# Patient Record
Sex: Female | Born: 1940 | Race: Black or African American | Hispanic: No | Marital: Married | Smoking: Former smoker
Health system: Southern US, Community
[De-identification: ages and names within clinical notes are randomized; demographics above are authoritative.]

## PROBLEM LIST (undated history)

## (undated) DIAGNOSIS — I10 Essential (primary) hypertension: Secondary | ICD-10-CM

## (undated) DIAGNOSIS — J189 Pneumonia, unspecified organism: Secondary | ICD-10-CM

## (undated) DIAGNOSIS — R040 Epistaxis: Secondary | ICD-10-CM

## (undated) DIAGNOSIS — R079 Chest pain, unspecified: Secondary | ICD-10-CM

## (undated) DIAGNOSIS — I780 Hereditary hemorrhagic telangiectasia: Secondary | ICD-10-CM

## (undated) DIAGNOSIS — N189 Chronic kidney disease, unspecified: Secondary | ICD-10-CM

## (undated) DIAGNOSIS — C8190 Hodgkin lymphoma, unspecified, unspecified site: Secondary | ICD-10-CM

## (undated) DIAGNOSIS — K219 Gastro-esophageal reflux disease without esophagitis: Secondary | ICD-10-CM

## (undated) DIAGNOSIS — E7800 Pure hypercholesterolemia, unspecified: Secondary | ICD-10-CM

## (undated) DIAGNOSIS — D649 Anemia, unspecified: Secondary | ICD-10-CM

## (undated) DIAGNOSIS — K3189 Other diseases of stomach and duodenum: Secondary | ICD-10-CM

## (undated) DIAGNOSIS — IMO0001 Reserved for inherently not codable concepts without codable children: Secondary | ICD-10-CM

## (undated) DIAGNOSIS — C3490 Malignant neoplasm of unspecified part of unspecified bronchus or lung: Secondary | ICD-10-CM

## (undated) DIAGNOSIS — J449 Chronic obstructive pulmonary disease, unspecified: Secondary | ICD-10-CM

## (undated) HISTORY — PX: NASAL SINUS SURGERY: SHX719

## (undated) HISTORY — PX: APPENDECTOMY: SHX54

## (undated) HISTORY — PX: OTHER SURGICAL HISTORY: SHX169

## (undated) HISTORY — PX: ABDOMINAL HYSTERECTOMY: SHX81

## (undated) HISTORY — DX: Gastro-esophageal reflux disease without esophagitis: K21.9

## (undated) HISTORY — DX: Essential (primary) hypertension: I10

## (undated) HISTORY — DX: Hodgkin lymphoma, unspecified, unspecified site: C81.90

## (undated) HISTORY — DX: Pure hypercholesterolemia, unspecified: E78.00

---

## 2004-03-01 ENCOUNTER — Ambulatory Visit: Payer: Self-pay | Admitting: Unknown Physician Specialty

## 2004-03-03 ENCOUNTER — Ambulatory Visit: Payer: Self-pay | Admitting: Unknown Physician Specialty

## 2005-03-31 ENCOUNTER — Ambulatory Visit: Payer: Self-pay | Admitting: Unknown Physician Specialty

## 2005-03-31 ENCOUNTER — Emergency Department: Payer: Self-pay | Admitting: Emergency Medicine

## 2005-09-22 ENCOUNTER — Ambulatory Visit: Payer: Self-pay | Admitting: Unknown Physician Specialty

## 2006-05-18 ENCOUNTER — Ambulatory Visit: Payer: Self-pay | Admitting: Unknown Physician Specialty

## 2007-06-13 ENCOUNTER — Ambulatory Visit: Payer: Self-pay | Admitting: Unknown Physician Specialty

## 2008-06-18 ENCOUNTER — Ambulatory Visit: Payer: Self-pay | Admitting: Unknown Physician Specialty

## 2009-06-24 ENCOUNTER — Ambulatory Visit: Payer: Self-pay | Admitting: Unknown Physician Specialty

## 2009-07-11 ENCOUNTER — Emergency Department: Payer: Self-pay | Admitting: Emergency Medicine

## 2009-07-12 ENCOUNTER — Emergency Department: Payer: Self-pay | Admitting: Emergency Medicine

## 2010-06-21 ENCOUNTER — Ambulatory Visit: Payer: Self-pay | Admitting: Unknown Physician Specialty

## 2010-07-02 ENCOUNTER — Ambulatory Visit: Payer: Self-pay | Admitting: Unknown Physician Specialty

## 2010-07-06 ENCOUNTER — Ambulatory Visit: Payer: Self-pay | Admitting: Unknown Physician Specialty

## 2010-08-31 ENCOUNTER — Ambulatory Visit: Payer: Self-pay | Admitting: Unknown Physician Specialty

## 2011-01-12 ENCOUNTER — Ambulatory Visit: Payer: Self-pay | Admitting: Urology

## 2011-06-24 ENCOUNTER — Ambulatory Visit: Payer: Self-pay | Admitting: Unknown Physician Specialty

## 2011-07-01 ENCOUNTER — Ambulatory Visit: Payer: Self-pay | Admitting: Oncology

## 2011-07-01 ENCOUNTER — Ambulatory Visit: Payer: Self-pay | Admitting: Otolaryngology

## 2011-07-01 LAB — LACTATE DEHYDROGENASE: LDH: 209 U/L (ref 84–246)

## 2011-07-11 ENCOUNTER — Ambulatory Visit: Payer: Self-pay | Admitting: Otolaryngology

## 2011-07-11 ENCOUNTER — Ambulatory Visit: Payer: Self-pay | Admitting: Physician Assistant

## 2011-07-11 LAB — PROTIME-INR
INR: 1
Prothrombin Time: 13.2 secs (ref 11.5–14.7)

## 2011-07-13 ENCOUNTER — Ambulatory Visit: Payer: Self-pay | Admitting: Otolaryngology

## 2011-07-20 LAB — CBC CANCER CENTER
Basophil %: 1.7 %
Basophil: 1 %
Eosinophil #: 0.3 x10 3/mm (ref 0.0–0.7)
Eosinophil %: 3.9 %
Eosinophil: 3 %
HGB: 12 g/dL (ref 12.0–16.0)
Lymphocyte %: 21.2 %
Lymphocytes: 25 %
MCH: 30 pg (ref 26.0–34.0)
Monocyte #: 0.6 x10 3/mm (ref 0.0–0.7)
Monocyte %: 8.5 %
Monocytes: 6 %
Neutrophil %: 64.7 %
RBC: 4 10*6/uL (ref 3.80–5.20)
Segmented Neutrophils: 65 %
WBC: 6.5 x10 3/mm (ref 3.6–11.0)

## 2011-07-29 ENCOUNTER — Ambulatory Visit: Payer: Self-pay | Admitting: Oncology

## 2011-08-03 ENCOUNTER — Ambulatory Visit: Payer: Self-pay | Admitting: Oncology

## 2011-08-04 ENCOUNTER — Ambulatory Visit: Payer: Self-pay | Admitting: Vascular Surgery

## 2011-08-08 LAB — CBC CANCER CENTER
Basophil #: 0 x10 3/mm (ref 0.0–0.1)
Eosinophil #: 0.5 x10 3/mm (ref 0.0–0.7)
Eosinophil %: 9.2 %
HCT: 32.9 % — ABNORMAL LOW (ref 35.0–47.0)
Lymphocyte #: 1 x10 3/mm (ref 1.0–3.6)
MCH: 30.3 pg (ref 26.0–34.0)
MCV: 89 fL (ref 80–100)
Monocyte #: 1.1 x10 3/mm — ABNORMAL HIGH (ref 0.0–0.7)
Neutrophil #: 3 x10 3/mm (ref 1.4–6.5)
Platelet: 249 x10 3/mm (ref 150–440)
RBC: 3.71 10*6/uL — ABNORMAL LOW (ref 3.80–5.20)
RDW: 19 % — ABNORMAL HIGH (ref 11.5–14.5)
WBC: 5.6 x10 3/mm (ref 3.6–11.0)

## 2011-08-08 LAB — COMPREHENSIVE METABOLIC PANEL
Albumin: 3.1 g/dL — ABNORMAL LOW (ref 3.4–5.0)
Alkaline Phosphatase: 133 U/L (ref 50–136)
Anion Gap: 8 (ref 7–16)
Calcium, Total: 8.4 mg/dL — ABNORMAL LOW (ref 8.5–10.1)
Co2: 26 mmol/L (ref 21–32)
EGFR (African American): 54 — ABNORMAL LOW
Glucose: 81 mg/dL (ref 65–99)
Potassium: 3.8 mmol/L (ref 3.5–5.1)
SGOT(AST): 35 U/L (ref 15–37)
SGPT (ALT): 38 U/L
Sodium: 139 mmol/L (ref 136–145)
Total Protein: 7.9 g/dL (ref 6.4–8.2)

## 2011-08-15 LAB — COMPREHENSIVE METABOLIC PANEL
Bilirubin,Total: 0.7 mg/dL (ref 0.2–1.0)
Calcium, Total: 8.9 mg/dL (ref 8.5–10.1)
Chloride: 100 mmol/L (ref 98–107)
Co2: 27 mmol/L (ref 21–32)
Creatinine: 1.35 mg/dL — ABNORMAL HIGH (ref 0.60–1.30)
EGFR (African American): 50 — ABNORMAL LOW
EGFR (Non-African Amer.): 41 — ABNORMAL LOW
Glucose: 112 mg/dL — ABNORMAL HIGH (ref 65–99)
Osmolality: 276 (ref 275–301)
Sodium: 136 mmol/L (ref 136–145)

## 2011-08-15 LAB — CBC CANCER CENTER
Basophil #: 0 x10 3/mm (ref 0.0–0.1)
Eosinophil #: 0 x10 3/mm (ref 0.0–0.7)
HCT: 34.4 % — ABNORMAL LOW (ref 35.0–47.0)
HGB: 11.7 g/dL — ABNORMAL LOW (ref 12.0–16.0)
Lymphocyte #: 0.6 x10 3/mm — ABNORMAL LOW (ref 1.0–3.6)
Lymphocyte %: 26.8 %
MCH: 29.9 pg (ref 26.0–34.0)
MCHC: 33.9 g/dL (ref 32.0–36.0)
Monocyte #: 0 x10 3/mm (ref 0.0–0.7)
Monocyte %: 1.6 %
Neutrophil #: 1.5 x10 3/mm (ref 1.4–6.5)
Platelet: 163 x10 3/mm (ref 150–440)
RDW: 16.9 % — ABNORMAL HIGH (ref 11.5–14.5)
WBC: 2.2 x10 3/mm — ABNORMAL LOW (ref 3.6–11.0)

## 2011-08-22 LAB — COMPREHENSIVE METABOLIC PANEL
Albumin: 3.3 g/dL — ABNORMAL LOW (ref 3.4–5.0)
BUN: 27 mg/dL — ABNORMAL HIGH (ref 7–18)
Bilirubin,Total: 0.2 mg/dL (ref 0.2–1.0)
Calcium, Total: 8.4 mg/dL — ABNORMAL LOW (ref 8.5–10.1)
Co2: 25 mmol/L (ref 21–32)
Glucose: 140 mg/dL — ABNORMAL HIGH (ref 65–99)
Potassium: 2.9 mmol/L — ABNORMAL LOW (ref 3.5–5.1)
SGPT (ALT): 35 U/L
Total Protein: 7.7 g/dL (ref 6.4–8.2)

## 2011-08-22 LAB — CBC CANCER CENTER
Bands: 4 %
Basophil %: 0.9 %
Basophil: 1 %
Lymphocyte #: 1.2 x10 3/mm (ref 1.0–3.6)
Lymphocytes: 39 %
MCH: 29.5 pg (ref 26.0–34.0)
Metamyelocyte: 1 %
Monocyte #: 1.5 x10 3/mm — ABNORMAL HIGH (ref 0.0–0.7)
Monocytes: 44 %
Neutrophil #: 0.6 x10 3/mm — ABNORMAL LOW (ref 1.4–6.5)

## 2011-08-22 LAB — BASIC METABOLIC PANEL
Calcium, Total: 7.5 mg/dL — ABNORMAL LOW (ref 8.5–10.1)
Co2: 25 mmol/L (ref 21–32)
Creatinine: 1.42 mg/dL — ABNORMAL HIGH (ref 0.60–1.30)
Glucose: 109 mg/dL — ABNORMAL HIGH (ref 65–99)
Potassium: 3.1 mmol/L — ABNORMAL LOW (ref 3.5–5.1)
Sodium: 136 mmol/L (ref 136–145)

## 2011-08-22 LAB — MAGNESIUM: Magnesium: 1.9 mg/dL

## 2011-08-24 LAB — BASIC METABOLIC PANEL
BUN: 14 mg/dL (ref 7–18)
Creatinine: 1.26 mg/dL (ref 0.60–1.30)
EGFR (Non-African Amer.): 45 — ABNORMAL LOW
Glucose: 98 mg/dL (ref 65–99)
Potassium: 3.8 mmol/L (ref 3.5–5.1)
Sodium: 135 mmol/L — ABNORMAL LOW (ref 136–145)

## 2011-08-24 LAB — CBC CANCER CENTER
Bands: 10 %
Basophil #: 0 x10 3/mm (ref 0.0–0.1)
Basophil %: 0.2 %
Eosinophil #: 0 x10 3/mm (ref 0.0–0.7)
HGB: 10 g/dL — ABNORMAL LOW (ref 12.0–16.0)
Lymphocyte #: 1.2 x10 3/mm (ref 1.0–3.6)
Monocyte #: 1.9 x10 3/mm — ABNORMAL HIGH (ref 0.0–0.7)
Neutrophil #: 3 x10 3/mm (ref 1.4–6.5)
Segmented Neutrophils: 41 %

## 2011-08-24 LAB — COMPREHENSIVE METABOLIC PANEL
Bilirubin,Total: 0.2 mg/dL (ref 0.2–1.0)
Total Protein: 6.9 g/dL (ref 6.4–8.2)

## 2011-08-29 ENCOUNTER — Ambulatory Visit: Payer: Self-pay | Admitting: Oncology

## 2011-09-01 LAB — COMPREHENSIVE METABOLIC PANEL
Albumin: 3.4 g/dL (ref 3.4–5.0)
Anion Gap: 12 (ref 7–16)
BUN: 13 mg/dL (ref 7–18)
Bilirubin,Total: 0.3 mg/dL (ref 0.2–1.0)
Calcium, Total: 9.1 mg/dL (ref 8.5–10.1)
Chloride: 99 mmol/L (ref 98–107)
Co2: 26 mmol/L (ref 21–32)
Creatinine: 1.44 mg/dL — ABNORMAL HIGH (ref 0.60–1.30)
EGFR (African American): 46 — ABNORMAL LOW
EGFR (Non-African Amer.): 38 — ABNORMAL LOW
Glucose: 158 mg/dL — ABNORMAL HIGH (ref 65–99)
Osmolality: 277 (ref 275–301)
Potassium: 3.7 mmol/L (ref 3.5–5.1)
SGOT(AST): 19 U/L (ref 15–37)
SGPT (ALT): 30 U/L
Sodium: 137 mmol/L (ref 136–145)
Total Protein: 8 g/dL (ref 6.4–8.2)

## 2011-09-01 LAB — CBC CANCER CENTER
Basophil #: 0 x10 3/mm (ref 0.0–0.1)
Basophil %: 0.2 %
Eosinophil #: 0 x10 3/mm (ref 0.0–0.7)
MCHC: 33.6 g/dL (ref 32.0–36.0)
Monocyte #: 0.9 x10 3/mm — ABNORMAL HIGH (ref 0.0–0.7)
Neutrophil #: 7.8 x10 3/mm — ABNORMAL HIGH (ref 1.4–6.5)
Platelet: 202 x10 3/mm (ref 150–440)
RBC: 3.68 10*6/uL — ABNORMAL LOW (ref 3.80–5.20)
RDW: 17.6 % — ABNORMAL HIGH (ref 11.5–14.5)

## 2011-09-08 LAB — CBC CANCER CENTER
Basophil #: 0 x10 3/mm (ref 0.0–0.1)
Eosinophil #: 0 x10 3/mm (ref 0.0–0.7)
HCT: 27.1 % — ABNORMAL LOW (ref 35.0–47.0)
HGB: 9 g/dL — ABNORMAL LOW (ref 12.0–16.0)
Lymphocyte #: 1.5 x10 3/mm (ref 1.0–3.6)
Lymphocyte %: 6.3 %
MCH: 29.7 pg (ref 26.0–34.0)
MCHC: 33.3 g/dL (ref 32.0–36.0)
MCV: 89 fL (ref 80–100)
Monocyte #: 1.3 x10 3/mm — ABNORMAL HIGH (ref 0.2–0.9)
Platelet: 264 x10 3/mm (ref 150–440)
RBC: 3.04 10*6/uL — ABNORMAL LOW (ref 3.80–5.20)

## 2011-09-08 LAB — COMPREHENSIVE METABOLIC PANEL
Albumin: 3.3 g/dL — ABNORMAL LOW (ref 3.4–5.0)
Bilirubin,Total: 0.2 mg/dL (ref 0.2–1.0)
Calcium, Total: 8.6 mg/dL (ref 8.5–10.1)
Chloride: 104 mmol/L (ref 98–107)
EGFR (African American): 44 — ABNORMAL LOW
EGFR (Non-African Amer.): 36 — ABNORMAL LOW
Osmolality: 283 (ref 275–301)
Potassium: 3.8 mmol/L (ref 3.5–5.1)
SGOT(AST): 27 U/L (ref 15–37)
SGPT (ALT): 29 U/L
Total Protein: 7.2 g/dL (ref 6.4–8.2)

## 2011-09-15 LAB — CBC CANCER CENTER
Basophil #: 0.1 x10 3/mm (ref 0.0–0.1)
HGB: 8.8 g/dL — ABNORMAL LOW (ref 12.0–16.0)
MCH: 28.5 pg (ref 26.0–34.0)
Monocyte %: 10 %
RBC: 3.09 10*6/uL — ABNORMAL LOW (ref 3.80–5.20)

## 2011-09-22 LAB — CBC CANCER CENTER
Basophil #: 0.1 x10 3/mm (ref 0.0–0.1)
Basophil %: 0.2 %
HGB: 8.7 g/dL — ABNORMAL LOW (ref 12.0–16.0)
Lymphocyte %: 6.4 %
MCH: 28.9 pg (ref 26.0–34.0)
MCV: 91 fL (ref 80–100)
Neutrophil #: 18.5 x10 3/mm — ABNORMAL HIGH (ref 1.4–6.5)
Platelet: 241 x10 3/mm (ref 150–440)

## 2011-09-22 LAB — COMPREHENSIVE METABOLIC PANEL
Albumin: 3.4 g/dL (ref 3.4–5.0)
Alkaline Phosphatase: 158 U/L — ABNORMAL HIGH (ref 50–136)
BUN: 16 mg/dL (ref 7–18)
Co2: 27 mmol/L (ref 21–32)
Creatinine: 1.23 mg/dL (ref 0.60–1.30)
EGFR (Non-African Amer.): 44 — ABNORMAL LOW
SGOT(AST): 16 U/L (ref 15–37)
Sodium: 141 mmol/L (ref 136–145)

## 2011-09-28 ENCOUNTER — Ambulatory Visit: Payer: Self-pay | Admitting: Oncology

## 2011-09-29 LAB — CBC CANCER CENTER
Basophil %: 0.8 %
Eosinophil #: 0.1 x10 3/mm (ref 0.0–0.7)
Eosinophil %: 0.7 %
HCT: 27.5 % — ABNORMAL LOW (ref 35.0–47.0)
Lymphocyte #: 1.1 x10 3/mm (ref 1.0–3.6)
Lymphocyte %: 13.3 %
MCHC: 32.3 g/dL (ref 32.0–36.0)
MCV: 92 fL (ref 80–100)
Monocyte #: 0.5 x10 3/mm (ref 0.2–0.9)
Monocyte %: 6.6 %
Neutrophil #: 6.3 x10 3/mm (ref 1.4–6.5)
Neutrophil %: 78.6 %
RDW: 23.3 % — ABNORMAL HIGH (ref 11.5–14.5)
WBC: 8 x10 3/mm (ref 3.6–11.0)

## 2011-10-06 LAB — COMPREHENSIVE METABOLIC PANEL
BUN: 18 mg/dL (ref 7–18)
Bilirubin,Total: 0.2 mg/dL (ref 0.2–1.0)
Calcium, Total: 8.4 mg/dL — ABNORMAL LOW (ref 8.5–10.1)
Chloride: 108 mmol/L — ABNORMAL HIGH (ref 98–107)
Potassium: 4.3 mmol/L (ref 3.5–5.1)
Sodium: 144 mmol/L (ref 136–145)
Total Protein: 7 g/dL (ref 6.4–8.2)

## 2011-10-06 LAB — CBC CANCER CENTER
Basophil #: 0.1 x10 3/mm (ref 0.0–0.1)
Eosinophil #: 0.1 x10 3/mm (ref 0.0–0.7)
Eosinophil %: 0.3 %
HCT: 25.8 % — ABNORMAL LOW (ref 35.0–47.0)
HGB: 8.1 g/dL — ABNORMAL LOW (ref 12.0–16.0)
Lymphocyte %: 6.8 %
Monocyte #: 2.1 x10 3/mm — ABNORMAL HIGH (ref 0.2–0.9)
Neutrophil #: 19.5 x10 3/mm — ABNORMAL HIGH (ref 1.4–6.5)
WBC: 23.3 x10 3/mm — ABNORMAL HIGH (ref 3.6–11.0)

## 2011-10-20 LAB — CBC CANCER CENTER
Bands: 11 %
HCT: 29.4 % — ABNORMAL LOW (ref 35.0–47.0)
HGB: 9.4 g/dL — ABNORMAL LOW (ref 12.0–16.0)
MCHC: 31.9 g/dL — ABNORMAL LOW (ref 32.0–36.0)
MCV: 93 fL (ref 80–100)
Metamyelocyte: 1 %
Platelet: 275 x10 3/mm (ref 150–440)
RBC: 3.16 10*6/uL — ABNORMAL LOW (ref 3.80–5.20)

## 2011-10-20 LAB — COMPREHENSIVE METABOLIC PANEL
Anion Gap: 10 (ref 7–16)
BUN: 19 mg/dL — ABNORMAL HIGH (ref 7–18)
Calcium, Total: 8.5 mg/dL (ref 8.5–10.1)
Chloride: 105 mmol/L (ref 98–107)
Co2: 26 mmol/L (ref 21–32)
Creatinine: 1.03 mg/dL (ref 0.60–1.30)
EGFR (Non-African Amer.): 55 — ABNORMAL LOW
Potassium: 4.1 mmol/L (ref 3.5–5.1)
SGOT(AST): 22 U/L (ref 15–37)
SGPT (ALT): 30 U/L
Total Protein: 7 g/dL (ref 6.4–8.2)

## 2011-10-29 ENCOUNTER — Ambulatory Visit: Payer: Self-pay | Admitting: Oncology

## 2011-11-02 ENCOUNTER — Ambulatory Visit: Payer: Self-pay | Admitting: Oncology

## 2011-11-28 ENCOUNTER — Ambulatory Visit: Payer: Self-pay | Admitting: Oncology

## 2011-12-12 LAB — CBC CANCER CENTER
Basophil %: 0.8 %
Eosinophil #: 0.1 x10 3/mm (ref 0.0–0.7)
HCT: 35.1 % (ref 35.0–47.0)
MCH: 29.9 pg (ref 26.0–34.0)
MCHC: 31.9 g/dL — ABNORMAL LOW (ref 32.0–36.0)
MCV: 94 fL (ref 80–100)
Monocyte #: 0.4 x10 3/mm (ref 0.2–0.9)
Monocyte %: 10.4 %
RDW: 17.7 % — ABNORMAL HIGH (ref 11.5–14.5)

## 2011-12-19 LAB — CBC CANCER CENTER
Basophil #: 0 x10 3/mm (ref 0.0–0.1)
Basophil %: 1.3 %
Eosinophil %: 2.4 %
HGB: 11.4 g/dL — ABNORMAL LOW (ref 12.0–16.0)
MCH: 29.2 pg (ref 26.0–34.0)
MCHC: 31.5 g/dL — ABNORMAL LOW (ref 32.0–36.0)
MCV: 93 fL (ref 80–100)
Monocyte #: 0.5 x10 3/mm (ref 0.2–0.9)
Monocyte %: 18.9 %
Neutrophil #: 1.9 x10 3/mm (ref 1.4–6.5)
Neutrophil %: 68.1 %
Platelet: 205 x10 3/mm (ref 150–440)
RBC: 3.92 10*6/uL (ref 3.80–5.20)
WBC: 2.8 x10 3/mm — ABNORMAL LOW (ref 3.6–11.0)

## 2011-12-26 LAB — CBC CANCER CENTER
Basophil %: 0.2 %
Eosinophil #: 0 x10 3/mm (ref 0.0–0.7)
Eosinophil %: 0 %
Lymphocyte #: 0.3 x10 3/mm — ABNORMAL LOW (ref 1.0–3.6)
Lymphocyte %: 3.4 %
MCH: 29.8 pg (ref 26.0–34.0)
Neutrophil #: 6.5 x10 3/mm (ref 1.4–6.5)
Platelet: 336 x10 3/mm (ref 150–440)
RDW: 17.9 % — ABNORMAL HIGH (ref 11.5–14.5)
WBC: 7.7 x10 3/mm (ref 3.6–11.0)

## 2011-12-29 ENCOUNTER — Ambulatory Visit: Payer: Self-pay | Admitting: Oncology

## 2012-01-19 LAB — COMPREHENSIVE METABOLIC PANEL
Alkaline Phosphatase: 68 U/L (ref 50–136)
Anion Gap: 9 (ref 7–16)
Calcium, Total: 8.7 mg/dL (ref 8.5–10.1)
Co2: 28 mmol/L (ref 21–32)
EGFR (Non-African Amer.): 38 — ABNORMAL LOW
Glucose: 98 mg/dL (ref 65–99)
Osmolality: 280 (ref 275–301)
Potassium: 3.4 mmol/L — ABNORMAL LOW (ref 3.5–5.1)
SGOT(AST): 22 U/L (ref 15–37)
Sodium: 139 mmol/L (ref 136–145)

## 2012-01-19 LAB — CBC CANCER CENTER
Eosinophil #: 0 x10 3/mm (ref 0.0–0.7)
Eosinophil %: 0.4 %
HCT: 37.3 % (ref 35.0–47.0)
HGB: 11.9 g/dL — ABNORMAL LOW (ref 12.0–16.0)
Lymphocyte #: 1 x10 3/mm (ref 1.0–3.6)
MCV: 89 fL (ref 80–100)
Monocyte %: 10.3 %
Neutrophil #: 2.7 x10 3/mm (ref 1.4–6.5)
Neutrophil %: 65.9 %
RBC: 4.18 10*6/uL (ref 3.80–5.20)
RDW: 20 % — ABNORMAL HIGH (ref 11.5–14.5)
WBC: 4.1 x10 3/mm (ref 3.6–11.0)

## 2012-01-29 ENCOUNTER — Ambulatory Visit: Payer: Self-pay | Admitting: Oncology

## 2012-02-14 LAB — CBC CANCER CENTER
Basophil #: 0.1 x10 3/mm (ref 0.0–0.1)
Basophil %: 0.8 %
Eosinophil %: 1.4 %
HCT: 33.1 % — ABNORMAL LOW (ref 35.0–47.0)
HGB: 10.6 g/dL — ABNORMAL LOW (ref 12.0–16.0)
Lymphocyte #: 1.7 x10 3/mm (ref 1.0–3.6)
MCH: 27.7 pg (ref 26.0–34.0)
Monocyte #: 1.2 x10 3/mm — ABNORMAL HIGH (ref 0.2–0.9)
Monocyte %: 13.8 %
Neutrophil #: 5.5 x10 3/mm (ref 1.4–6.5)
Platelet: 395 x10 3/mm (ref 150–440)
RBC: 3.82 10*6/uL (ref 3.80–5.20)
WBC: 8.6 x10 3/mm (ref 3.6–11.0)

## 2012-02-14 LAB — BASIC METABOLIC PANEL
Anion Gap: 9 (ref 7–16)
BUN: 19 mg/dL — ABNORMAL HIGH (ref 7–18)
Creatinine: 1.79 mg/dL — ABNORMAL HIGH (ref 0.60–1.30)
EGFR (African American): 32 — ABNORMAL LOW
EGFR (Non-African Amer.): 28 — ABNORMAL LOW
Glucose: 173 mg/dL — ABNORMAL HIGH (ref 65–99)
Osmolality: 284 (ref 275–301)
Potassium: 3.7 mmol/L (ref 3.5–5.1)
Sodium: 139 mmol/L (ref 136–145)

## 2012-02-14 LAB — MAGNESIUM: Magnesium: 1.7 mg/dL — ABNORMAL LOW

## 2012-02-20 ENCOUNTER — Ambulatory Visit: Payer: Self-pay | Admitting: Oncology

## 2012-02-28 ENCOUNTER — Ambulatory Visit: Payer: Self-pay | Admitting: Oncology

## 2012-04-06 ENCOUNTER — Ambulatory Visit: Payer: Self-pay | Admitting: Oncology

## 2012-04-17 ENCOUNTER — Ambulatory Visit: Payer: Self-pay | Admitting: Specialist

## 2012-04-29 ENCOUNTER — Ambulatory Visit: Payer: Self-pay | Admitting: Oncology

## 2012-05-30 ENCOUNTER — Ambulatory Visit: Payer: Self-pay | Admitting: Oncology

## 2012-05-30 DIAGNOSIS — C8190 Hodgkin lymphoma, unspecified, unspecified site: Secondary | ICD-10-CM

## 2012-05-30 HISTORY — DX: Hodgkin lymphoma, unspecified, unspecified site: C81.90

## 2012-06-15 LAB — COMPREHENSIVE METABOLIC PANEL
Albumin: 3.4 g/dL (ref 3.4–5.0)
Anion Gap: 9 (ref 7–16)
Bilirubin,Total: 0.5 mg/dL (ref 0.2–1.0)
Calcium, Total: 8.9 mg/dL (ref 8.5–10.1)
Chloride: 104 mmol/L (ref 98–107)
Co2: 27 mmol/L (ref 21–32)
EGFR (African American): 45 — ABNORMAL LOW
EGFR (Non-African Amer.): 39 — ABNORMAL LOW
Glucose: 88 mg/dL (ref 65–99)
SGOT(AST): 25 U/L (ref 15–37)
SGPT (ALT): 29 U/L (ref 12–78)
Sodium: 140 mmol/L (ref 136–145)
Total Protein: 7.5 g/dL (ref 6.4–8.2)

## 2012-06-15 LAB — CBC CANCER CENTER
Basophil %: 0.8 %
Eosinophil #: 0.1 x10 3/mm (ref 0.0–0.7)
Eosinophil %: 3.2 %
HCT: 30.6 % — ABNORMAL LOW (ref 35.0–47.0)
Lymphocyte #: 0.9 x10 3/mm — ABNORMAL LOW (ref 1.0–3.6)
Lymphocyte %: 20.8 %
MCH: 29.8 pg (ref 26.0–34.0)
MCHC: 33.6 g/dL (ref 32.0–36.0)
MCV: 89 fL (ref 80–100)
Monocyte %: 17.1 %
Neutrophil #: 2.5 x10 3/mm (ref 1.4–6.5)
Neutrophil %: 58.1 %
RDW: 17.5 % — ABNORMAL HIGH (ref 11.5–14.5)

## 2012-06-30 ENCOUNTER — Ambulatory Visit: Payer: Self-pay | Admitting: Oncology

## 2012-07-28 ENCOUNTER — Ambulatory Visit: Payer: Self-pay | Admitting: Oncology

## 2012-08-28 ENCOUNTER — Ambulatory Visit: Payer: Self-pay | Admitting: Oncology

## 2012-09-03 ENCOUNTER — Ambulatory Visit: Payer: Self-pay | Admitting: Physician Assistant

## 2012-09-10 LAB — CBC CANCER CENTER
Basophil #: 0 x10 3/mm (ref 0.0–0.1)
Basophil %: 1.3 %
Eosinophil %: 12 %
HGB: 10 g/dL — ABNORMAL LOW (ref 12.0–16.0)
Lymphocyte #: 0.8 x10 3/mm — ABNORMAL LOW (ref 1.0–3.6)
Lymphocyte %: 27.1 %
MCH: 29.1 pg (ref 26.0–34.0)
MCHC: 32.6 g/dL (ref 32.0–36.0)
Monocyte #: 0.8 x10 3/mm (ref 0.2–0.9)
Monocyte %: 27.5 %
Neutrophil #: 0.9 x10 3/mm — ABNORMAL LOW (ref 1.4–6.5)
Platelet: 151 x10 3/mm (ref 150–440)
RDW: 18.5 % — ABNORMAL HIGH (ref 11.5–14.5)
WBC: 2.8 x10 3/mm — ABNORMAL LOW (ref 3.6–11.0)

## 2012-09-10 LAB — BASIC METABOLIC PANEL
Anion Gap: 9 (ref 7–16)
BUN: 22 mg/dL — ABNORMAL HIGH (ref 7–18)
Calcium, Total: 9.3 mg/dL (ref 8.5–10.1)
Chloride: 104 mmol/L (ref 98–107)
Co2: 26 mmol/L (ref 21–32)
Creatinine: 1.41 mg/dL — ABNORMAL HIGH (ref 0.60–1.30)
EGFR (African American): 43 — ABNORMAL LOW
EGFR (Non-African Amer.): 37 — ABNORMAL LOW
Osmolality: 280 (ref 275–301)
Potassium: 4.1 mmol/L (ref 3.5–5.1)
Sodium: 139 mmol/L (ref 136–145)

## 2012-09-13 ENCOUNTER — Ambulatory Visit: Payer: Self-pay | Admitting: Oncology

## 2012-09-27 ENCOUNTER — Ambulatory Visit: Payer: Self-pay | Admitting: Oncology

## 2012-10-28 ENCOUNTER — Ambulatory Visit: Payer: Self-pay | Admitting: Oncology

## 2012-12-18 ENCOUNTER — Ambulatory Visit: Payer: Self-pay | Admitting: Oncology

## 2012-12-21 ENCOUNTER — Ambulatory Visit: Payer: Self-pay | Admitting: Physician Assistant

## 2012-12-27 LAB — CBC CANCER CENTER
Basophil #: 0 x10 3/mm (ref 0.0–0.1)
Basophil %: 0.5 %
Eosinophil #: 0.3 x10 3/mm (ref 0.0–0.7)
Eosinophil %: 7 %
HGB: 11 g/dL — ABNORMAL LOW (ref 12.0–16.0)
Lymphocyte %: 23.2 %
MCH: 31.3 pg (ref 26.0–34.0)
MCHC: 34.4 g/dL (ref 32.0–36.0)
Monocyte #: 0.9 x10 3/mm (ref 0.2–0.9)
Monocyte %: 21.8 %
Neutrophil #: 2 x10 3/mm (ref 1.4–6.5)
Neutrophil %: 47.5 %
Platelet: 164 x10 3/mm (ref 150–440)
RBC: 3.52 10*6/uL — ABNORMAL LOW (ref 3.80–5.20)

## 2012-12-27 LAB — BASIC METABOLIC PANEL
Anion Gap: 7 (ref 7–16)
BUN: 22 mg/dL — ABNORMAL HIGH (ref 7–18)
Calcium, Total: 9.4 mg/dL (ref 8.5–10.1)
Chloride: 106 mmol/L (ref 98–107)
EGFR (Non-African Amer.): 40 — ABNORMAL LOW
Potassium: 4.5 mmol/L (ref 3.5–5.1)
Sodium: 140 mmol/L (ref 136–145)

## 2012-12-27 LAB — LACTATE DEHYDROGENASE: LDH: 191 U/L (ref 81–246)

## 2012-12-28 ENCOUNTER — Ambulatory Visit: Payer: Self-pay | Admitting: Oncology

## 2013-02-07 ENCOUNTER — Ambulatory Visit: Payer: Self-pay | Admitting: Oncology

## 2013-02-27 ENCOUNTER — Ambulatory Visit: Payer: Self-pay | Admitting: Oncology

## 2013-03-25 LAB — COMPREHENSIVE METABOLIC PANEL
Albumin: 3.3 g/dL — ABNORMAL LOW (ref 3.4–5.0)
Alkaline Phosphatase: 340 U/L — ABNORMAL HIGH (ref 50–136)
Chloride: 105 mmol/L (ref 98–107)
EGFR (African American): 50 — ABNORMAL LOW
EGFR (Non-African Amer.): 43 — ABNORMAL LOW
Osmolality: 280 (ref 275–301)
Potassium: 3.9 mmol/L (ref 3.5–5.1)
SGOT(AST): 62 U/L — ABNORMAL HIGH (ref 15–37)
SGPT (ALT): 81 U/L — ABNORMAL HIGH (ref 12–78)

## 2013-03-25 LAB — CBC CANCER CENTER
Eosinophil #: 0.3 x10 3/mm (ref 0.0–0.7)
Eosinophil %: 8 %
HGB: 11.2 g/dL — ABNORMAL LOW (ref 12.0–16.0)
Lymphocyte #: 1 x10 3/mm (ref 1.0–3.6)
Lymphocyte %: 27.1 %
MCH: 30.8 pg (ref 26.0–34.0)
MCV: 92 fL (ref 80–100)
Monocyte #: 0.9 x10 3/mm (ref 0.2–0.9)
Monocyte %: 23.1 %
Neutrophil %: 40.9 %
Platelet: 184 x10 3/mm (ref 150–440)
RDW: 17.4 % — ABNORMAL HIGH (ref 11.5–14.5)
WBC: 3.8 x10 3/mm (ref 3.6–11.0)

## 2013-03-30 ENCOUNTER — Ambulatory Visit: Payer: Self-pay | Admitting: Oncology

## 2013-04-03 ENCOUNTER — Ambulatory Visit: Payer: Self-pay

## 2013-04-12 DIAGNOSIS — I10 Essential (primary) hypertension: Secondary | ICD-10-CM

## 2013-04-12 LAB — CBC CANCER CENTER
Basophil #: 0 x10 3/mm (ref 0.0–0.1)
Eosinophil #: 0.3 x10 3/mm (ref 0.0–0.7)
Lymphocyte %: 25.6 %
Monocyte %: 21.7 %
Neutrophil #: 1.3 x10 3/mm — ABNORMAL LOW (ref 1.4–6.5)
Platelet: 166 x10 3/mm (ref 150–440)
RBC: 3.79 10*6/uL — ABNORMAL LOW (ref 3.80–5.20)
RDW: 17.8 % — ABNORMAL HIGH (ref 11.5–14.5)

## 2013-04-12 LAB — COMPREHENSIVE METABOLIC PANEL
Albumin: 3.6 g/dL (ref 3.4–5.0)
Alkaline Phosphatase: 361 U/L — ABNORMAL HIGH (ref 50–136)
BUN: 28 mg/dL — ABNORMAL HIGH (ref 7–18)
Chloride: 106 mmol/L (ref 98–107)
Co2: 29 mmol/L (ref 21–32)
EGFR (African American): 51 — ABNORMAL LOW
EGFR (Non-African Amer.): 44 — ABNORMAL LOW
Glucose: 123 mg/dL — ABNORMAL HIGH (ref 65–99)
Osmolality: 281 (ref 275–301)
SGOT(AST): 86 U/L — ABNORMAL HIGH (ref 15–37)
SGPT (ALT): 96 U/L — ABNORMAL HIGH (ref 12–78)

## 2013-04-12 LAB — PROTIME-INR
INR: 0.9
Prothrombin Time: 12.6 secs (ref 11.5–14.7)

## 2013-04-15 ENCOUNTER — Ambulatory Visit: Payer: Self-pay | Admitting: Cardiothoracic Surgery

## 2013-04-17 ENCOUNTER — Inpatient Hospital Stay: Payer: Self-pay

## 2013-04-18 LAB — CBC WITH DIFFERENTIAL/PLATELET
Basophil #: 0 10*3/uL (ref 0.0–0.1)
Basophil %: 0.5 %
HCT: 32.2 % — ABNORMAL LOW (ref 35.0–47.0)
HGB: 10.9 g/dL — ABNORMAL LOW (ref 12.0–16.0)
Lymphocyte #: 0.9 10*3/uL — ABNORMAL LOW (ref 1.0–3.6)
Lymphocyte %: 16.3 %
MCHC: 33.8 g/dL (ref 32.0–36.0)
MCV: 92 fL (ref 80–100)
Monocyte #: 1 x10 3/mm — ABNORMAL HIGH (ref 0.2–0.9)
Monocyte %: 18.4 %
Neutrophil #: 3.6 10*3/uL (ref 1.4–6.5)
Neutrophil %: 63.3 %
Platelet: 154 10*3/uL (ref 150–440)
RDW: 17.7 % — ABNORMAL HIGH (ref 11.5–14.5)

## 2013-04-18 LAB — BASIC METABOLIC PANEL
Co2: 25 mmol/L (ref 21–32)
Creatinine: 1.16 mg/dL (ref 0.60–1.30)
Sodium: 135 mmol/L — ABNORMAL LOW (ref 136–145)

## 2013-04-29 ENCOUNTER — Ambulatory Visit: Payer: Self-pay | Admitting: Oncology

## 2013-05-30 ENCOUNTER — Ambulatory Visit: Payer: Self-pay | Admitting: Oncology

## 2013-05-30 ENCOUNTER — Ambulatory Visit: Payer: Self-pay | Admitting: Internal Medicine

## 2013-05-30 DIAGNOSIS — C3490 Malignant neoplasm of unspecified part of unspecified bronchus or lung: Secondary | ICD-10-CM

## 2013-05-30 HISTORY — DX: Malignant neoplasm of unspecified part of unspecified bronchus or lung: C34.90

## 2013-08-06 ENCOUNTER — Ambulatory Visit: Payer: Self-pay | Admitting: Oncology

## 2013-08-07 ENCOUNTER — Ambulatory Visit: Payer: Self-pay | Admitting: Oncology

## 2013-08-07 LAB — CREATININE, SERUM
Creatinine: 1.27 mg/dL (ref 0.60–1.30)
EGFR (African American): 49 — ABNORMAL LOW
GFR CALC NON AF AMER: 42 — AB

## 2013-08-09 LAB — COMPREHENSIVE METABOLIC PANEL
ALBUMIN: 3.2 g/dL — AB (ref 3.4–5.0)
AST: 64 U/L — AB (ref 15–37)
Alkaline Phosphatase: 406 U/L — ABNORMAL HIGH
Anion Gap: 6 — ABNORMAL LOW (ref 7–16)
BUN: 16 mg/dL (ref 7–18)
Bilirubin,Total: 0.8 mg/dL (ref 0.2–1.0)
CHLORIDE: 104 mmol/L (ref 98–107)
CO2: 30 mmol/L (ref 21–32)
Calcium, Total: 10.1 mg/dL (ref 8.5–10.1)
Creatinine: 1.25 mg/dL (ref 0.60–1.30)
EGFR (African American): 50 — ABNORMAL LOW
GFR CALC NON AF AMER: 43 — AB
GLUCOSE: 77 mg/dL (ref 65–99)
OSMOLALITY: 279 (ref 275–301)
POTASSIUM: 4.3 mmol/L (ref 3.5–5.1)
SGPT (ALT): 95 U/L — ABNORMAL HIGH (ref 12–78)
Sodium: 140 mmol/L (ref 136–145)
Total Protein: 7.7 g/dL (ref 6.4–8.2)

## 2013-08-09 LAB — IRON AND TIBC
Iron Bind.Cap.(Total): 221 ug/dL — ABNORMAL LOW (ref 250–450)
Iron Saturation: 51 %
Iron: 113 ug/dL (ref 50–170)
Unbound Iron-Bind.Cap.: 108 ug/dL

## 2013-08-09 LAB — CBC CANCER CENTER
Basophil #: 0 x10 3/mm (ref 0.0–0.1)
Basophil %: 0.9 %
Eosinophil #: 0.2 x10 3/mm (ref 0.0–0.7)
Eosinophil %: 4.5 %
HCT: 35.9 % (ref 35.0–47.0)
HGB: 11.6 g/dL — ABNORMAL LOW (ref 12.0–16.0)
Lymphocyte #: 0.9 x10 3/mm — ABNORMAL LOW (ref 1.0–3.6)
Lymphocyte %: 20.5 %
MCH: 30 pg (ref 26.0–34.0)
MCHC: 32.4 g/dL (ref 32.0–36.0)
MCV: 93 fL (ref 80–100)
MONO ABS: 0.8 x10 3/mm (ref 0.2–0.9)
MONOS PCT: 19.3 %
Neutrophil #: 2.4 x10 3/mm (ref 1.4–6.5)
Neutrophil %: 54.8 %
PLATELETS: 231 x10 3/mm (ref 150–440)
RBC: 3.88 10*6/uL (ref 3.80–5.20)
RDW: 17.1 % — AB (ref 11.5–14.5)
WBC: 4.4 x10 3/mm (ref 3.6–11.0)

## 2013-08-09 LAB — TSH: THYROID STIMULATING HORM: 0.749 u[IU]/mL

## 2013-08-09 LAB — FERRITIN: FERRITIN (ARMC): 244 ng/mL (ref 8–388)

## 2013-08-09 LAB — LACTATE DEHYDROGENASE: LDH: 176 U/L (ref 81–246)

## 2013-08-09 LAB — T4, FREE: FREE THYROXINE: 1.04 ng/dL (ref 0.76–1.46)

## 2013-08-28 ENCOUNTER — Ambulatory Visit: Payer: Self-pay | Admitting: Oncology

## 2013-11-08 ENCOUNTER — Ambulatory Visit: Payer: Self-pay | Admitting: Oncology

## 2013-11-11 ENCOUNTER — Ambulatory Visit: Payer: Self-pay | Admitting: Oncology

## 2013-11-27 ENCOUNTER — Ambulatory Visit: Payer: Self-pay | Admitting: Oncology

## 2013-12-12 ENCOUNTER — Ambulatory Visit: Payer: Self-pay | Admitting: Oncology

## 2013-12-12 LAB — CBC CANCER CENTER
BASOS ABS: 0 x10 3/mm (ref 0.0–0.1)
Basophil %: 1.1 %
EOS PCT: 4.7 %
Eosinophil #: 0.1 x10 3/mm (ref 0.0–0.7)
HCT: 34.6 % — ABNORMAL LOW (ref 35.0–47.0)
HGB: 11.3 g/dL — AB (ref 12.0–16.0)
Lymphocyte #: 1 x10 3/mm (ref 1.0–3.6)
Lymphocyte %: 35.5 %
MCH: 30.8 pg (ref 26.0–34.0)
MCHC: 32.7 g/dL (ref 32.0–36.0)
MCV: 94 fL (ref 80–100)
MONO ABS: 0.9 x10 3/mm (ref 0.2–0.9)
Monocyte %: 32.4 %
Neutrophil #: 0.7 x10 3/mm — ABNORMAL LOW (ref 1.4–6.5)
Neutrophil %: 26.3 %
Platelet: 186 x10 3/mm (ref 150–440)
RBC: 3.67 10*6/uL — ABNORMAL LOW (ref 3.80–5.20)
RDW: 18.7 % — ABNORMAL HIGH (ref 11.5–14.5)
WBC: 2.7 x10 3/mm — ABNORMAL LOW (ref 3.6–11.0)

## 2013-12-19 LAB — CBC CANCER CENTER
BASOS ABS: 0 x10 3/mm (ref 0.0–0.1)
BASOS PCT: 1.1 %
Eosinophil #: 0.1 x10 3/mm (ref 0.0–0.7)
Eosinophil %: 3.5 %
HCT: 36.5 % (ref 35.0–47.0)
HGB: 11.8 g/dL — AB (ref 12.0–16.0)
Lymphocyte #: 0.8 x10 3/mm — ABNORMAL LOW (ref 1.0–3.6)
Lymphocyte %: 34.9 %
MCH: 30.2 pg (ref 26.0–34.0)
MCHC: 32.4 g/dL (ref 32.0–36.0)
MCV: 93 fL (ref 80–100)
MONO ABS: 0.7 x10 3/mm (ref 0.2–0.9)
Monocyte %: 28.6 %
Neutrophil #: 0.8 x10 3/mm — ABNORMAL LOW (ref 1.4–6.5)
Neutrophil %: 31.9 %
Platelet: 171 x10 3/mm (ref 150–440)
RBC: 3.91 10*6/uL (ref 3.80–5.20)
RDW: 18.2 % — AB (ref 11.5–14.5)
WBC: 2.4 x10 3/mm — ABNORMAL LOW (ref 3.6–11.0)

## 2013-12-19 LAB — FOLATE: Folic Acid: 69.3 ng/mL (ref 3.1–100.0)

## 2013-12-28 ENCOUNTER — Ambulatory Visit: Payer: Self-pay | Admitting: Oncology

## 2014-03-13 ENCOUNTER — Ambulatory Visit: Payer: Self-pay | Admitting: Oncology

## 2014-03-13 LAB — CBC CANCER CENTER
BASOS PCT: 1.4 %
Basophil #: 0 x10 3/mm (ref 0.0–0.1)
EOS ABS: 0.1 x10 3/mm (ref 0.0–0.7)
EOS PCT: 2 %
HCT: 35.2 % (ref 35.0–47.0)
HGB: 11.5 g/dL — ABNORMAL LOW (ref 12.0–16.0)
LYMPHS PCT: 35.4 %
Lymphocyte #: 1 x10 3/mm (ref 1.0–3.6)
MCH: 30 pg (ref 26.0–34.0)
MCHC: 32.7 g/dL (ref 32.0–36.0)
MCV: 92 fL (ref 80–100)
MONO ABS: 1.1 x10 3/mm — AB (ref 0.2–0.9)
MONOS PCT: 40.4 %
Neutrophil #: 0.6 x10 3/mm — ABNORMAL LOW (ref 1.4–6.5)
Neutrophil %: 20.8 %
Platelet: 156 x10 3/mm (ref 150–440)
RBC: 3.84 10*6/uL (ref 3.80–5.20)
RDW: 18 % — ABNORMAL HIGH (ref 11.5–14.5)
WBC: 2.7 x10 3/mm — ABNORMAL LOW (ref 3.6–11.0)

## 2014-03-13 LAB — COMPREHENSIVE METABOLIC PANEL
ALK PHOS: 444 U/L — AB
Albumin: 3.4 g/dL (ref 3.4–5.0)
Anion Gap: 5 — ABNORMAL LOW (ref 7–16)
BUN: 21 mg/dL — ABNORMAL HIGH (ref 7–18)
Bilirubin,Total: 1.1 mg/dL — ABNORMAL HIGH (ref 0.2–1.0)
CALCIUM: 9.8 mg/dL (ref 8.5–10.1)
CHLORIDE: 103 mmol/L (ref 98–107)
CREATININE: 1.3 mg/dL (ref 0.60–1.30)
Co2: 30 mmol/L (ref 21–32)
EGFR (Non-African Amer.): 43 — ABNORMAL LOW
GFR CALC AF AMER: 52 — AB
GLUCOSE: 82 mg/dL (ref 65–99)
OSMOLALITY: 278 (ref 275–301)
Potassium: 4.3 mmol/L (ref 3.5–5.1)
SGOT(AST): 66 U/L — ABNORMAL HIGH (ref 15–37)
SGPT (ALT): 72 U/L — ABNORMAL HIGH
Sodium: 138 mmol/L (ref 136–145)
Total Protein: 8.1 g/dL (ref 6.4–8.2)

## 2014-03-13 LAB — LACTATE DEHYDROGENASE: LDH: 177 U/L (ref 81–246)

## 2014-03-30 ENCOUNTER — Ambulatory Visit: Payer: Self-pay | Admitting: Oncology

## 2014-04-09 ENCOUNTER — Ambulatory Visit: Payer: Self-pay | Admitting: Physician Assistant

## 2014-09-19 ENCOUNTER — Ambulatory Visit
Admit: 2014-09-19 | Disposition: A | Discharge status: Home / Self Care | Payer: Self-pay | Attending: Oncology | Admitting: Oncology

## 2014-09-19 LAB — CBC CANCER CENTER
BASOS ABS: 0 x10 3/mm (ref 0.0–0.1)
Basophil %: 1 %
Eosinophil #: 0.1 x10 3/mm (ref 0.0–0.7)
Eosinophil %: 2.6 %
HCT: 32.1 % — AB (ref 35.0–47.0)
HGB: 10.6 g/dL — AB (ref 12.0–16.0)
Lymphocyte #: 0.7 x10 3/mm — ABNORMAL LOW (ref 1.0–3.6)
Lymphocyte %: 35.1 %
MCH: 30.6 pg (ref 26.0–34.0)
MCHC: 33.1 g/dL (ref 32.0–36.0)
MCV: 92 fL (ref 80–100)
MONO ABS: 0.9 x10 3/mm (ref 0.2–0.9)
Monocyte %: 43.1 %
NEUTROS PCT: 18.2 %
Neutrophil #: 0.4 x10 3/mm — ABNORMAL LOW (ref 1.4–6.5)
PLATELETS: 185 x10 3/mm (ref 150–440)
RBC: 3.48 10*6/uL — ABNORMAL LOW (ref 3.80–5.20)
RDW: 18.6 % — AB (ref 11.5–14.5)
WBC: 2.1 x10 3/mm — ABNORMAL LOW (ref 3.6–11.0)

## 2014-09-19 LAB — COMPREHENSIVE METABOLIC PANEL
ALK PHOS: 299 U/L — AB
ANION GAP: 5 — AB (ref 7–16)
Albumin: 3.1 g/dL — ABNORMAL LOW
BILIRUBIN TOTAL: 1.3 mg/dL — AB
BUN: 27 mg/dL — ABNORMAL HIGH
CALCIUM: 9 mg/dL
Chloride: 103 mmol/L
Co2: 27 mmol/L
Creatinine: 1.11 mg/dL — ABNORMAL HIGH
EGFR (African American): 57 — ABNORMAL LOW
GFR CALC NON AF AMER: 49 — AB
Glucose: 85 mg/dL
Potassium: 4.3 mmol/L
SGOT(AST): 62 U/L — ABNORMAL HIGH
SGPT (ALT): 68 U/L — ABNORMAL HIGH
SODIUM: 135 mmol/L
Total Protein: 6.9 g/dL

## 2014-09-19 LAB — LACTATE DEHYDROGENASE: LDH: 137 U/L

## 2014-09-19 NOTE — Discharge Summary (Signed)
PATIENT NAME:  Marie Lowe, Marie Lowe MR#:  [id number] DATE OF BIRTH:  [birth year]  DATE OF ADMISSION:  04/17/2013 DATE OF DISCHARGE:  04/21/2013  ADMITTING DIAGNOSIS: Carcinoma of the lung.   DISCHARGE DIAGNOSIS: Carcinoma of the lung.  OPERATION PERFORMED: Right thoracoscopy with wedge resection of right lower lobe mass.   HOSPITAL COURSE: Ms. Marie Lowe is a 74 year old woman who was found to have multiple right lower lobe and left lower lobe lesions. Because of the need to establish a definitive diagnosis, she was offered the above-named procedure. She was brought into the hospital on 04/17/2013 at which time she underwent a right thoracoscopy with wedge resection of a right lower lobe mass. 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 On the 23rd of November, she was felt ready to be discharged. Her chest tube was removed on November 22nd and she was discharged to home on November 23rd. She was scheduled to follow up with Dr. Genevive Bi in 1 week. Her wounds were all healing as expected, and her chest x-ray looked fine after chest tube removal.  ____________________________ Lew Dawes. Genevive Bi, MD teo:sb D: 05/07/2013 15:17:39 ET T: 05/07/2013 16:08:43 ET JOB#: [id number]  cc: Christia Reading E. Genevive Bi, MD, <Dictator> Louis Matte MD ELECTRONICALLY SIGNED 05/28/2013 10:44

## 2014-09-19 NOTE — Op Note (Signed)
PATIENT NAME:  Marie Lowe, Marie Lowe MR#:  [id number] DATE OF BIRTH:  1941/02/22  DATE OF PROCEDURE:  04/17/2013  SURGEON: Christia Reading E. Genevive Bi, M.D.   ASSISTANT: Bronson Ing.   PREOPERATIVE DIAGNOSIS:  1.  Left supraclavicular mass.  2.  Right lower lobe mass x 3.   POSTOPERATIVE DIAGNOSIS:  1.  Left supraclavicular mass (lipoma).  2.  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   PROCEDURES PERFORMED:  1.  Preoperative bronchoscopy to assess endobronchial anatomy. 2.  Excision of left supraclavicular mass.  3.  Right thoracoscopy with wedge resection, right lower lobe mass and frozen section consistent with squamous cell carcinoma.   INDICATIONS FOR PROCEDURE: Marie Lowe is a 74 year old African American female with a long-standing history of tobacco use and COPD. She also carries a diagnosis of lymphoma and during her evaluation for her lymphoma as part of her followup, was found to have a right lower lobe nodule. Subsequent scans confirmed the presence of 3 pulmonary nodules in the right lower lobe and 1 in the left lower lobe. A PET scan showed that 2 of these lesions were PET positive. The others were perhaps too small for evaluation. In any event, she was apprised of the indications and risks of the above-named procedure, and she gave her informed consent.   DESCRIPTION OF PROCEDURE: The patient was brought to the operating suite and placed in the supine position. General endotracheal anesthesia was given with a double-lumen tube.  Preoperative bronchoscopy was carried out and was normal to the subsegmental levels bilaterally. There was no evidence of endobronchial tumor. The left side of the patient's neck was then prepped and draped in the usual sterile fashion. A skin incision was made overlying the mass, which measured approximately 2 cm in the left supraclavicular area. The incision was deepened down through the platysma until this mass was entered. This had a typical appearance of  a lipoma and was easily shelled out from the surrounding tissues. Hemostasis was complete and the wounds were then closed in multiple layers, including nylon on the skin.   The patient was then turned for right thoracoscopy. All pressure points were carefully padded. Again, the patient was prepped and draped in the usual sterile fashion. Three thoracic ports were created in a triangular fashion on the lower aspect of the right hemithorax. The most inferior port served as our camera. Grasping the lung from the anterior port, we were able to bring it up to a palpating digit through the posterior port. I was able to identify the lesion in the right lower lobe and a wedge resection was performed of this mass. It had a typical appearance of a lung cancer on the external surface. We bisected the lesion and sent half for frozen section and kept the remaining portion in case this were a lymphoma. This was sent to pathology and a frozen section diagnosis of squamous cell carcinoma was made. The remaining portion was sent. Hemostasis was complete and a single 28 chest tube was placed through the most inferior port and positioned to the apex of the chest. The wounds were all closed then in multiple layers using Vicryl on the deeper layers and nylon on the skin. Sterile dressings were applied, and the patient was rolled in the supine position where she was taken to the recovery room in stable condition.     ____________________________ Lew Dawes Genevive Bi, MD teo:dmm D: 04/17/2013 21:54:00 ET T: 04/17/2013 22:34:49 ET JOB#: [id number]  cc: Lew Dawes. [place],  MD, <Dictator> Kathlene November. Grayland Ormond, MD Louis Matte MD ELECTRONICALLY SIGNED 04/29/2013 5:23

## 2014-09-21 NOTE — Op Note (Signed)
PATIENT NAME:  Marie Lowe, Marie Lowe MR#:  [id number] DATE OF BIRTH:  [birth year]  DATE OF PROCEDURE:  08/04/2011  PREOPERATIVE DIAGNOSIS: Hodgkin's disease with poor venous access.   POSTOPERATIVE DIAGNOSIS: Hodgkin's disease with poor venous access.   PROCEDURES PERFORMED: 1. Ultrasound guidance for vascular access, right jugular vein.  2. Fluoroscopic guidance for placement of catheter.  3. Placement of CT compatible Infuse-a-Port, right jugular vein.   SURGEON: Algernon Huxley, M.D.   ANESTHESIA: Local with moderate conscious sedation.   ESTIMATED BLOOD LOSS: Minimal.   FLUOROSCOPY TIME: Less than one minute.   CONTRAST USED: None.   INDICATION FOR PROCEDURE: This is a 74 year old African American female with recent diagnosis of Hodgkin's disease. She starts chemotherapy next week and needs a port for access. The risks and benefits were discussed and informed consent was obtained.   DESCRIPTION OF PROCEDURE: The patient was brought to the vascular interventional radiology suite. The right neck and chest were sterilely prepped and draped and a sterile surgical field was created. The right jugular vein was visualized and found to be widely patent. It was then accessed under direct ultrasound guidance without difficulty with a Seldinger needle and permanent image was recorded. J-wire was placed. After skin nick and dilatation, a peel-away sheath was placed over the wire. I then turned my attention to the subclavicular region. Two fingerbreadths below the right clavicle, the area was anesthetized and a transverse incision was created. The port was secured into an inferior pocket with two Prolene sutures connected to the catheter. The catheter was then tunneled from the subclavicular incision to the access site and fluoroscopic guidance was used to cut the catheter to an appropriate length. The catheter was then placed through the peel-away sheath and the peel-away sheath was removed. The catheter tip  was in good location, in the superior vena cava, without kink. The wound was copiously irrigated with antibiotic impregnated saline, and the subclavicular incision was closed with a running 3-0 Vicryl and a 4-0 Monocryl. The access incision was closed with a single 4-0 Monocryl. The Huber needle was used to access the port which withdrew blood well and flushed easily with heparinized saline, and a sterile dressing was placed. The patient tolerated the procedure well and was taken to the recovery room in stable condition.  ____________________________ Algernon Huxley, MD jsd:slb D: 08/04/2011 16:01:41 ET T: 08/04/2011 16:44:10 ET JOB#: [id number]  cc: Algernon Huxley, MD, <Dictator> Algernon Huxley MD ELECTRONICALLY SIGNED 08/12/2011 16:24

## 2014-09-21 NOTE — Consult Note (Signed)
Reason for Visit: This 74 year old Female patient presents to the clinic for initial evaluation of  Hodgkin's lymphoma .   Referred by Dr. Grayland Ormond.  Diagnosis:   Chief Complaint/Diagnosis   74 year old female with stage II Hodgkin's lymphoma status post 4 cycles a DD chemotherapy with complete response by PET/CT   HPI   patient is a 74 year old female who presents presented with a significant weight loss over the past year. She did develop some cervical adenopathy and underwent excisional biopsy which was positive for classical Hodgkin's disease. Special stains and workup concurred with diagnosis of classical Hodgkin's.patient is undergone 4 cycles of AVD chemotherapy.her initial PET CT scan demonstrated an avid uptake in her cervical nodes as well as left parapharyngeal space. She tolerated her chemotherapy well. She had repeat PET/CT showing no residual avid uptake in her head and neck region or any other areas of the body. She specifically denies fevers chills or night sweats. Her weight has stabilized. She seen today for consideration of involved field radiation therapy.  Past Hx:    GERD - Esophageal Reflux:    Hyperlipidemia:    Hypertension:    Appendectomy:    Hysterectomy - Total:   Past, Family and Social History:   Past Medical History positive    Cardiovascular hyperlipidemia; hypertension    Gastrointestinal GERD    Past Surgical History appendectomy; Hysterectomy    Family History positive    Family History Comments Family history positive for colon cancer, breast cancer, cardia vascular heart disease, and adult onset diabetes    Social History positive    Social History Comments 40-pack-year smoking history, no EtOH use history    Additional Past Medical and Surgical History Accompanied by son today   Allergies:   No Known Allergies:   Home Meds:  Home Medications: Medication Instructions Status  ondansetron 4 mg tablet 1 tab(s) orally every 4 hours  x 15 days, As Needed Active  acetaminophen 500 mg oral tablet 2 tab(s) orally every 6 hours as needed for pain Active  multivitamin 1 tab(s) orally once a day Active  hydrochlorothiazide 12.5 mg oral capsule 1 cap(s) orally once a day AM Active  amlodipine-benazepril 10 mg-40 mg oral capsule 1 cap(s) orally once a day AM Active  Premarin 0.625 mg/g vaginal cream with applicator  vaginal  HS Active  triamcinolone acetonide cream on rash 2 x daily on leg Active   Review of Systems:   General negative    Performance Status (ECOG) 0    Skin negative    Breast negative    Ophthalmologic negative    ENMT negative    Respiratory and Thorax negative    Cardiovascular negative    Gastrointestinal negative    Genitourinary negative    Musculoskeletal negative    Neurological negative    Psychiatric negative    Hematology/Lymphatics negative    Endocrine negative    Allergic/Immunologic see HPI   Physical Exam:  General/Skin/HEENT:   General normal    Skin normal    Eyes normal    ENMT normal    Head and Neck normal    Additional PE Well-developed well-nourished female in NAD. Oral cavity is clear no oral mucosal lesions are identified. Tonsillar regions are within normal limits. Neck is clear without evidence of some digastric cervical or supraclavicular adenopathy. No peripheral adenopathy is noted. Lungs are clear to A&P cardiac examination shows regular rate and rhythm.   Breasts/Resp/CV/GI/GU:   Respiratory and Thorax normal  Cardiovascular normal    Gastrointestinal normal    Genitourinary normal   MS/Neuro/Psych/Lymph:   Musculoskeletal normal    Neurological normal    Lymphatics normal   Assessment and Plan:  Impression:   74 year old female with stage II classic Hodgkin's disease with complete response by PET/CT after 4 cycles of a BD chemotherapy.  Plan:   this time I have recommended adjuvant radiation therapy to the involved field.  Would treat her head and neck region and upper mediastinum and supraclavicular area up to 3000 cGy over 3 weeks. Risks and benefits of treatment including slight fatigue, possible sore throat, slight irritation of skin and alteration of blood counts were all explained in detail to the patient and her son. I will give her another 2 weeks to recuperate from her chemotherapy and start treatment planning at that time. Have discussed the case personally with Dr. Grayland Ormond.  I would like to take this opportunity to thank you for allowing me to continue to participate in this patient's care.  CC Referral:   cc: Dr. Alvira Philips   Electronic Signatures: Baruch Gouty, Roda Shutters (MD)  (Signed 06-Jun-13 15:51)  Authored: HPI, Diagnosis, Past Hx, PFSH, Allergies, Home Meds, ROS, Physical Exam, Encounter Assessment and Plan, CC Referring Physician   Last Updated: 06-Jun-13 15:51 by Armstead Peaks (MD)

## 2014-09-21 NOTE — Op Note (Signed)
PATIENT NAME:  Marie Lowe, Marie Lowe MR#:  [id number] DATE OF BIRTH:  05-27-1941  DATE OF PROCEDURE:  07/13/2011  PREOPERATIVE DIAGNOSIS: Left cervical lymphadenopathy with lymphoma.   POSTOPERATIVE DIAGNOSIS:  Left cervical lymphadenopathy with lymphoma.   PROCEDURE: Excisional biopsy of left deep cervical lymph node.   SURGEON: Malon Kindle, MD   ANESTHESIA: Sedation with monitored anesthesia care and local anesthetic.   INDICATIONS: This 74 year old female has cervical and mediastinal lymphadenopathy with fine needle aspiration consistent with B-cell lymphoma though excisional node biopsy has been requested for further studies for staging and treatment planning.   FINDINGS: This was about a 1.5-cm left group 5 cervical lymph node along the spinal accessory chain that was excised.   COMPLICATIONS: None.   DESCRIPTION OF PROCEDURE: After obtaining informed consent, the patient was taken to the operating room and placed in the supine position. Sedation was administered and the skin overlying the lymph node injected with 1% lidocaine with epinephrine 1:200,000.  She was then prepped and draped in the usual sterile fashion. A #15 blade was used to incise the skin over the lymph node and subcutaneous dissection carefully performed, watching for the spinal accessory nerve. Soft tissues were divided carefully using the bipolar cautery. The lymph node and some adjacent nerves were quickly identified. One of the nerves appeared most consistent with the spinal accessory nerve and was running across the top of the lymph node. This nerve had to be carefully dissected away from the lymph node but certainly was not invaded by node at all. The more anterior nerve was likely a cervical sensory nerve, but both of these nerves were carefully preserved with gentle dissection. The lymph node was carefully dissected out along its capsule to avoid injury to these adjacent nerves. The vascular supply to the node was divided  with bipolar cautery. The node was then sent as a fresh specimen to the pathologist for lymphoma studies. Bleeding was minimal. The subcutaneous tissues were closed with 4-0 Vicryl suture in interrupted fashion. There was felt no need for a drain. The skin was then closed with 5-0 fast absorbing gut suture. Bacitracin ointment and a Band-Aid were applied. The patient was then taken to the recovery room awake and in good condition.     ____________________________ Sammuel Hines. Richardson Landry, MD psb:bjt D: 07/13/2011 11:57:43 ET T: 07/13/2011 12:21:52 ET JOB#: [id number]  cc: Sammuel Hines. Richardson Landry, MD, <Dictator> Riley Nearing MD ELECTRONICALLY SIGNED 07/22/2011 17:59

## 2014-09-22 ENCOUNTER — Other Ambulatory Visit: Payer: Self-pay | Admitting: Oncology

## 2014-09-22 DIAGNOSIS — R911 Solitary pulmonary nodule: Secondary | ICD-10-CM

## 2014-09-22 DIAGNOSIS — C3490 Malignant neoplasm of unspecified part of unspecified bronchus or lung: Secondary | ICD-10-CM

## 2014-09-22 DIAGNOSIS — C8190 Hodgkin lymphoma, unspecified, unspecified site: Secondary | ICD-10-CM

## 2014-09-23 ENCOUNTER — Other Ambulatory Visit: Payer: Self-pay | Admitting: Oncology

## 2014-09-23 DIAGNOSIS — C3490 Malignant neoplasm of unspecified part of unspecified bronchus or lung: Secondary | ICD-10-CM

## 2014-09-23 DIAGNOSIS — R911 Solitary pulmonary nodule: Secondary | ICD-10-CM

## 2014-09-25 LAB — CREATININE, SERUM: Creatine, Serum: 1.11

## 2014-10-01 ENCOUNTER — Ambulatory Visit
Admission: RE | Admit: 2014-10-01 | Discharge: 2014-10-01 | Disposition: A | Discharge status: Home / Self Care | Payer: Medicare Other | Source: Ambulatory Visit | Attending: Oncology | Admitting: Oncology

## 2014-10-01 DIAGNOSIS — R911 Solitary pulmonary nodule: Secondary | ICD-10-CM | POA: Diagnosis not present

## 2014-10-01 DIAGNOSIS — C8590 Non-Hodgkin lymphoma, unspecified, unspecified site: Secondary | ICD-10-CM | POA: Insufficient documentation

## 2014-10-01 DIAGNOSIS — C3490 Malignant neoplasm of unspecified part of unspecified bronchus or lung: Secondary | ICD-10-CM | POA: Insufficient documentation

## 2014-10-01 LAB — GLUCOSE, CAPILLARY: GLUCOSE-CAPILLARY: 61 mg/dL — AB (ref 70–99)

## 2014-10-01 MED ORDER — FLUDEOXYGLUCOSE F - 18 (FDG) INJECTION
12.2000 | Freq: Once | INTRAVENOUS | Status: AC | PRN
  Administered 2014-10-01: 12.2 via INTRAVENOUS

## 2014-10-02 ENCOUNTER — Ambulatory Visit: Payer: Self-pay | Admitting: Oncology

## 2014-10-02 ENCOUNTER — Inpatient Hospital Stay: Payer: Medicare Other | Attending: Oncology | Admitting: Oncology

## 2014-10-02 ENCOUNTER — Encounter: Payer: Self-pay | Admitting: Oncology

## 2014-10-02 DIAGNOSIS — Z803 Family history of malignant neoplasm of breast: Secondary | ICD-10-CM | POA: Insufficient documentation

## 2014-10-02 DIAGNOSIS — I10 Essential (primary) hypertension: Secondary | ICD-10-CM | POA: Insufficient documentation

## 2014-10-02 DIAGNOSIS — D72819 Decreased white blood cell count, unspecified: Secondary | ICD-10-CM | POA: Insufficient documentation

## 2014-10-02 DIAGNOSIS — D709 Neutropenia, unspecified: Secondary | ICD-10-CM | POA: Insufficient documentation

## 2014-10-02 DIAGNOSIS — Z87891 Personal history of nicotine dependence: Secondary | ICD-10-CM | POA: Insufficient documentation

## 2014-10-02 DIAGNOSIS — Z79899 Other long term (current) drug therapy: Secondary | ICD-10-CM | POA: Insufficient documentation

## 2014-10-02 DIAGNOSIS — E780 Pure hypercholesterolemia: Secondary | ICD-10-CM | POA: Insufficient documentation

## 2014-10-02 DIAGNOSIS — J449 Chronic obstructive pulmonary disease, unspecified: Secondary | ICD-10-CM | POA: Insufficient documentation

## 2014-10-02 DIAGNOSIS — C8190 Hodgkin lymphoma, unspecified, unspecified site: Secondary | ICD-10-CM | POA: Insufficient documentation

## 2014-10-02 DIAGNOSIS — Z800 Family history of malignant neoplasm of digestive organs: Secondary | ICD-10-CM | POA: Insufficient documentation

## 2014-10-02 DIAGNOSIS — C3412 Malignant neoplasm of upper lobe, left bronchus or lung: Secondary | ICD-10-CM | POA: Insufficient documentation

## 2014-10-02 DIAGNOSIS — K219 Gastro-esophageal reflux disease without esophagitis: Secondary | ICD-10-CM | POA: Insufficient documentation

## 2014-10-07 NOTE — Progress Notes (Signed)
This encounter was created in error - please disregard.

## 2014-10-08 ENCOUNTER — Inpatient Hospital Stay (HOSPITAL_BASED_OUTPATIENT_CLINIC_OR_DEPARTMENT_OTHER): Payer: Medicare Other | Admitting: Oncology

## 2014-10-08 VITALS — BP 132/71 | HR 82 | Temp 97.2°F | Resp 18 | Wt 147.9 lb

## 2014-10-08 DIAGNOSIS — J449 Chronic obstructive pulmonary disease, unspecified: Secondary | ICD-10-CM | POA: Diagnosis not present

## 2014-10-08 DIAGNOSIS — E780 Pure hypercholesterolemia: Secondary | ICD-10-CM

## 2014-10-08 DIAGNOSIS — D72819 Decreased white blood cell count, unspecified: Secondary | ICD-10-CM

## 2014-10-08 DIAGNOSIS — I10 Essential (primary) hypertension: Secondary | ICD-10-CM

## 2014-10-08 DIAGNOSIS — D709 Neutropenia, unspecified: Secondary | ICD-10-CM

## 2014-10-08 DIAGNOSIS — Z87891 Personal history of nicotine dependence: Secondary | ICD-10-CM | POA: Diagnosis not present

## 2014-10-08 DIAGNOSIS — Z79899 Other long term (current) drug therapy: Secondary | ICD-10-CM

## 2014-10-08 DIAGNOSIS — C3412 Malignant neoplasm of upper lobe, left bronchus or lung: Secondary | ICD-10-CM

## 2014-10-08 DIAGNOSIS — K219 Gastro-esophageal reflux disease without esophagitis: Secondary | ICD-10-CM

## 2014-10-08 DIAGNOSIS — Z803 Family history of malignant neoplasm of breast: Secondary | ICD-10-CM | POA: Diagnosis not present

## 2014-10-08 DIAGNOSIS — C8190 Hodgkin lymphoma, unspecified, unspecified site: Secondary | ICD-10-CM

## 2014-10-08 DIAGNOSIS — Z800 Family history of malignant neoplasm of digestive organs: Secondary | ICD-10-CM

## 2014-10-08 DIAGNOSIS — C3431 Malignant neoplasm of lower lobe, right bronchus or lung: Secondary | ICD-10-CM

## 2014-10-20 ENCOUNTER — Institutional Professional Consult (permissible substitution): Payer: Medicare Other | Admitting: Radiation Oncology

## 2014-10-20 ENCOUNTER — Other Ambulatory Visit: Payer: Self-pay | Admitting: *Deleted

## 2014-10-20 DIAGNOSIS — Z85118 Personal history of other malignant neoplasm of bronchus and lung: Secondary | ICD-10-CM

## 2014-10-24 ENCOUNTER — Other Ambulatory Visit: Payer: Self-pay | Admitting: Physician Assistant

## 2014-10-28 ENCOUNTER — Ambulatory Visit
Admission: RE | Admit: 2014-10-28 | Discharge: 2014-10-28 | Disposition: A | Discharge status: Home / Self Care | Payer: Medicare Other | Source: Ambulatory Visit | Attending: Interventional Radiology | Admitting: Interventional Radiology

## 2014-10-28 ENCOUNTER — Ambulatory Visit
Admission: RE | Admit: 2014-10-28 | Discharge: 2014-10-28 | Disposition: A | Discharge status: Home / Self Care | Payer: Medicare Other | Source: Ambulatory Visit | Attending: Radiation Oncology | Admitting: Radiation Oncology

## 2014-10-28 ENCOUNTER — Other Ambulatory Visit: Payer: Self-pay | Admitting: *Deleted

## 2014-10-28 DIAGNOSIS — C3412 Malignant neoplasm of upper lobe, left bronchus or lung: Secondary | ICD-10-CM | POA: Diagnosis not present

## 2014-10-28 DIAGNOSIS — Z833 Family history of diabetes mellitus: Secondary | ICD-10-CM | POA: Diagnosis not present

## 2014-10-28 DIAGNOSIS — J95811 Postprocedural pneumothorax: Secondary | ICD-10-CM | POA: Diagnosis present

## 2014-10-28 DIAGNOSIS — Z8249 Family history of ischemic heart disease and other diseases of the circulatory system: Secondary | ICD-10-CM | POA: Insufficient documentation

## 2014-10-28 DIAGNOSIS — E780 Pure hypercholesterolemia: Secondary | ICD-10-CM | POA: Diagnosis not present

## 2014-10-28 DIAGNOSIS — C8190 Hodgkin lymphoma, unspecified, unspecified site: Secondary | ICD-10-CM | POA: Diagnosis not present

## 2014-10-28 DIAGNOSIS — Z809 Family history of malignant neoplasm, unspecified: Secondary | ICD-10-CM | POA: Diagnosis not present

## 2014-10-28 DIAGNOSIS — Z01812 Encounter for preprocedural laboratory examination: Secondary | ICD-10-CM | POA: Insufficient documentation

## 2014-10-28 DIAGNOSIS — Z9889 Other specified postprocedural states: Secondary | ICD-10-CM

## 2014-10-28 DIAGNOSIS — R911 Solitary pulmonary nodule: Secondary | ICD-10-CM | POA: Diagnosis present

## 2014-10-28 DIAGNOSIS — J9381 Chronic pneumothorax: Secondary | ICD-10-CM

## 2014-10-28 DIAGNOSIS — D649 Anemia, unspecified: Secondary | ICD-10-CM | POA: Diagnosis not present

## 2014-10-28 DIAGNOSIS — Y848 Other medical procedures as the cause of abnormal reaction of the patient, or of later complication, without mention of misadventure at the time of the procedure: Secondary | ICD-10-CM | POA: Insufficient documentation

## 2014-10-28 DIAGNOSIS — J449 Chronic obstructive pulmonary disease, unspecified: Secondary | ICD-10-CM | POA: Diagnosis not present

## 2014-10-28 DIAGNOSIS — I10 Essential (primary) hypertension: Secondary | ICD-10-CM | POA: Insufficient documentation

## 2014-10-28 DIAGNOSIS — K219 Gastro-esophageal reflux disease without esophagitis: Secondary | ICD-10-CM | POA: Insufficient documentation

## 2014-10-28 DIAGNOSIS — Z85118 Personal history of other malignant neoplasm of bronchus and lung: Secondary | ICD-10-CM

## 2014-10-28 HISTORY — DX: Chronic obstructive pulmonary disease, unspecified: J44.9

## 2014-10-28 HISTORY — DX: Anemia, unspecified: D64.9

## 2014-10-28 HISTORY — DX: Reserved for inherently not codable concepts without codable children: IMO0001

## 2014-10-28 LAB — CBC WITH DIFFERENTIAL/PLATELET
Basophils Absolute: 0 10*3/uL (ref 0–0.1)
Basophils Relative: 1 %
EOS ABS: 0.1 10*3/uL (ref 0–0.7)
Eosinophils Relative: 3 %
HCT: 35.1 % (ref 35.0–47.0)
HEMOGLOBIN: 11.7 g/dL — AB (ref 12.0–16.0)
Lymphocytes Relative: 39 %
Lymphs Abs: 0.7 10*3/uL — ABNORMAL LOW (ref 1.0–3.6)
MCH: 31.7 pg (ref 26.0–34.0)
MCHC: 33.4 g/dL (ref 32.0–36.0)
MCV: 94.8 fL (ref 80.0–100.0)
Monocytes Absolute: 0.7 10*3/uL (ref 0.2–0.9)
Monocytes Relative: 38 %
NEUTROS PCT: 19 %
Neutro Abs: 0.3 10*3/uL — ABNORMAL LOW (ref 1.4–6.5)
Platelets: 134 10*3/uL — ABNORMAL LOW (ref 150–440)
RBC: 3.7 MIL/uL — ABNORMAL LOW (ref 3.80–5.20)
RDW: 18 % — ABNORMAL HIGH (ref 11.5–14.5)
WBC: 1.8 10*3/uL — ABNORMAL LOW (ref 3.6–11.0)

## 2014-10-28 LAB — PROTIME-INR
INR: 0.96
Prothrombin Time: 13 seconds (ref 11.4–15.0)

## 2014-10-28 MED ORDER — MIDAZOLAM HCL 5 MG/5ML IJ SOLN
INTRAMUSCULAR | Status: AC | PRN
  Administered 2014-10-28: 1 mg via INTRAVENOUS
  Administered 2014-10-28: 0.5 mg via INTRAVENOUS

## 2014-10-28 MED ORDER — FENTANYL CITRATE (PF) 100 MCG/2ML IJ SOLN
INTRAMUSCULAR | Status: AC | PRN
  Administered 2014-10-28: 25 ug via INTRAVENOUS
  Administered 2014-10-28: 50 ug via INTRAVENOUS

## 2014-10-28 MED ORDER — SODIUM CHLORIDE 0.9 % IV SOLN
INTRAVENOUS | Status: DC
  Administered 2014-10-28: 12:00:00 via INTRAVENOUS

## 2014-10-28 NOTE — Progress Notes (Signed)
[place]  [phone] [phone]  ID: Marie Lowe OB: 1940/12/01  MR#: [id number]  GGY#:[id number]  Patient Care Team: Marinda Elk, MD as PCP - General (Physician Assistant)  CHIEF COMPLAINT:  Chief Complaint  Patient presents with  . Follow-up    Hodgkin lymphoma/lung cancer/Discuss test results    INTERVAL HISTORY: Patient returns to clinic today for routine evaluation and discussion of her PET scan results. She continues to feel well and remains asymptomatic.  She has a good appetite and her weight is stable. She denies any fevers or night sweats.  She denies any weakness or fatigue. She has no neurologic complaints.  She denies any nausea, vomiting, constipation, or diarrhea. She has no chest pain, cough, or shortness of breath. She has no urinary complaints.  Patient offers no specific complaints today.  REVIEW OF SYSTEMS:   Review of Systems  Constitutional: Negative.   Cardiovascular: Negative.   Endo/Heme/Allergies: Negative.     As per HPI. Otherwise, a complete review of systems is negatve.  PAST MEDICAL HISTORY: Past Medical History  Diagnosis Date  . Hypertension   . High cholesterol   . GERD (gastroesophageal reflux disease)   . COPD (chronic obstructive pulmonary disease)   . Shortness of breath dyspnea   . Hodgkin's lymphoma   . Anemia     PAST SURGICAL HISTORY: Past Surgical History  Procedure Laterality Date  . Abdominal hysterectomy    . Appendectomy    . Right lower lobe wedge resection      FAMILY HISTORY Family History  Problem Relation Age of Onset  . Diabetes Other   . CAD Other   . Cancer Other     colon cancer, breast cancer       ADVANCED DIRECTIVES:    HEALTH MAINTENANCE: History  Substance Use Topics  . Smoking status: Former Smoker -- 1.00 packs/day for 18 years    Types: Cigarettes  . Smokeless tobacco: Never Used  . Alcohol Use: 0.6 oz/week    1 Glasses of wine per  week     Comment: Occassionaly     Colonoscopy:  PAP:  Bone density:  Lipid panel:  Allergies  Allergen Reactions  . Hydrochlorothiazide Other (See Comments)    Elevation of creatinine Patient doesn't remember what reaction she had to it.    Current Outpatient Prescriptions  Medication Sig Dispense Refill  . albuterol (PROVENTIL HFA;VENTOLIN HFA) 108 (90 BASE) MCG/ACT inhaler Inhale into the lungs.    Marland Kitchen amLODipine-benazepril (LOTREL) 10-40 MG per capsule Take 1 capsule by mouth daily.    Marland Kitchen antiseptic oral rinse (BIOTENE) LIQD by Mucous Membrane route.    Marland Kitchen atorvastatin (LIPITOR) 10 MG tablet Take by mouth.    . budesonide-formoterol (SYMBICORT) 160-4.5 MCG/ACT inhaler Inhale 2 puffs into the lungs 2 (two) times daily.    . Calcium-Vitamin D (CALTRATE 600 PLUS-VIT D PO) Take 1 tablet by mouth 2 (two) times daily.    . carvedilol (COREG) 6.25 MG tablet Take 6.25 mg by mouth 2 (two) times daily with a meal.    . Cod Liver Oil 10 MINIM CAPS Take 1 capsule by mouth daily.    . Cyanocobalamin 1000 MCG TBCR Take by mouth.    . Multiple Vitamins-Minerals (MULTIVITAMIN WITH MINERALS) tablet Take 1 tablet by mouth daily.    Marland Kitchen pyridOXINE (VITAMIN B-6) 100 MG tablet Take 100 mg by mouth daily.    . ranitidine (ZANTAC) 150 MG tablet     .  tiotropium (SPIRIVA) 18 MCG inhalation capsule Place 18 mcg into inhaler and inhale daily.     No current facility-administered medications for this visit.   Facility-Administered Medications Ordered in Other Visits  Medication Dose Route Frequency Provider Last Rate Last Dose  . 0.9 %  sodium chloride infusion   Intravenous Continuous Ardis Rowan, PA-C 50 mL/hr at 10/28/14 1130      OBJECTIVE: Filed Vitals:   10/08/14 1702  BP: 132/71  Pulse: 82  Temp: 97.2 F (36.2 C)  Resp: 18     Body mass index is 23.89 kg/(m^2).    ECOG FS:0 - Asymptomatic  General: Well-developed, well-nourished, no acute distress. Eyes: anicteric sclera. HEENT:  No palpable lymphadenopathy.  Lungs: Clear to auscultation bilaterally. Heart: Regular rate and rhythm. No rubs, murmurs, or gallops. Abdomen: Soft, nontender, nondistended. No organomegaly noted, normoactive bowel sounds. Musculoskeletal: No edema, cyanosis, or clubbing. Neuro: Alert, answering all questions appropriately. Cranial nerves grossly intact. Skin: No rashes or petechiae noted. Psych: Normal affect.   LAB RESULTS:  Lab Results  Component Value Date   NA 135 09/19/2014   K 4.3 09/19/2014   CL 103 09/19/2014   CO2 27 09/19/2014   GLUCOSE 85 09/19/2014   BUN 27* 09/19/2014   CREATININE 1.11* 09/19/2014   CALCIUM 9.0 09/19/2014   PROT 6.9 09/19/2014   ALBUMIN 3.1* 09/19/2014   AST 62* 09/19/2014   ALT 68* 09/19/2014   ALKPHOS 299* 09/19/2014   GFRNONAA 49* 09/19/2014   GFRAA 57* 09/19/2014    Lab Results  Component Value Date   WBC 1.8* 10/28/2014   NEUTROABS 0.3* 10/28/2014   HGB 11.7* 10/28/2014   HCT 35.1 10/28/2014   MCV 94.8 10/28/2014   PLT 134* 10/28/2014     STUDIES: Nm Pet Image Restag (ps) Skull Base To Thigh  10/02/2014   CLINICAL DATA:  Subsequent treatment strategy for lung carcinoma and lymphoma. New left upper lobe pulmonary nodule seen on recent CT.  EXAM: NUCLEAR MEDICINE PET SKULL BASE TO THIGH  TECHNIQUE: 12.2 mCi F-18 FDG was injected intravenously. Full-ring PET imaging was performed from the skull base to thigh after the radiotracer. CT data was obtained and used for attenuation correction and anatomic localization.  FASTING BLOOD GLUCOSE:  Value: 61 mg/dl  COMPARISON:  CT on 09/19/2014 and previous PET-CT on 04/03/2013  FINDINGS: NECK  No hypermetabolic lymph nodes in the neck.  CHEST  New 10 mm pulmonary nodule in the left upper lobe on image 84 is hypermetabolic, with SUV max of 4.9.  Spiculated nodule is seen in the superior segment right lower lobe on image 84 measuring 13 mm which has increased in size compared to previous study and shows  mild hypermetabolic activity. A posterior subpleural right lower lobe nodule measures 15 mm which also shows mild increase compared to 11 mm on 2014 PET-CT. This has SUV max of 7.6 on today's study compared to 3.8 previously.  Other subpleural ill-defined nodular opacity in the posterior right lower lobe on image 109 shows mild decrease in size since previous study, and also shows decreased metabolic activity, with SUV max of 2.0 on today's study compared to 5.3 previously. Severe emphysema again noted.  Mild hypermetabolic lymphadenopathy in both hilar regions and mediastinum show no significant change. SUV max of index lymph node in the subcarinal region measures 3.4 compared to 3.7 previously.  ABDOMEN/PELVIS  No abnormal hypermetabolic activity within the liver, pancreas, adrenal glands, or spleen. Complex left upper pole renal lesion is  difficult to visualized on noncontrast CT images but shows no associated hypermetabolic activity suggesting a benign etiology. No hypermetabolic lymph nodes in the abdomen or pelvis.  SKELETON  No focal hypermetabolic activity to suggest skeletal metastasis.  IMPRESSION: New 10 mm hypermetabolic pulmonary nodule in left upper lobe, and mild increase and FDG activity associated with 2 hypermetabolic nodules in the right lower lobe, consistent with mild progression of metastatic disease.  Stable mild hypermetabolic mediastinal and hilar lymphadenopathy.  No evidence of metastatic disease within the neck, abdomen, or pelvis.   Electronically Signed   By: Earle Gell M.D.   On: 10/02/2014 17:22   Ct Biopsy  10/28/2014   CLINICAL DATA:  Left upper lobe lung nodule  EXAM: CT-GUIDED BIOPSY OF A LEFT UPPER LOBE LUNG NODULE.  CORE.  MEDICATIONS AND MEDICAL HISTORY: Versed 2 mg, Fentanyl 75 mcg.  Additional Medications: None.  ANESTHESIA/SEDATION: Moderate sedation time: 30 minutes  PROCEDURE: The procedure, risks, benefits, and alternatives were explained to the patient. Questions  regarding the procedure were encouraged and answered. The patient understands and consents to the procedure.  The left upper thorax was prepped with ChloraPrep in a sterile fashion, and a sterile drape was applied covering the operative field. A sterile gown and sterile gloves were used for the procedure.  Under CT guidance, a(n) 17 gauge guide needle was advanced into the left upper lobe lung nodule. Subsequently 4 18 gauge core biopsies were obtained. The guide needle was removed. Final imaging was performed.  Patient tolerated the procedure well without complication. Vital sign monitoring by nursing staff during the procedure will continue as patient is in the special procedures unit for post procedure observation.  FINDINGS: The images document guide needle placement within the left upper lobe lung nodule. Post biopsy images demonstrate no pneumothorax.  COMPLICATIONS: None  IMPRESSION: Successful CT-guided core biopsy of a left upper lobe lung nodule.   Electronically Signed   By: Marybelle Killings M.D.   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  PLAN:    1.  Hodgkin's lymphoma: No evidence of disease.  Previously noted pulmonary nodule consistent with lung cancer.  Continue to monitor routinely with CT scans. 2.  Lung cancer: PET scan results reviewed independently and reported as above with new pulmonary nodule suspicious of either metastatic disease or second primary. Case was discussed at length with radiation oncology who feels a biopsy would be necessary prior to initiating any treatments. Patient has refused any further surgery and does not wish to undergo chemotherapy at this time. Follow-up with radiation oncology after her biopsy for further evaluation and treatment planning if necessary. Patient will then follow-up in 3-4 months with repeat imaging and further evaluation. 3.  Leukopenia/neutropenia: Patient's  WBC count is decreased, but stable. She is also asymptomatic. No intervention is needed at this time. This is possibly related to myelosuppression from previous chemotherapy from Hodgkin's. Continue to monitor.    Patient expressed understanding and was in agreement with this plan. She also understands that She can call clinic at any time with any questions, concerns, or complaints.   No matching staging information was found for the patient.  Lloyd Huger, MD   10/28/2014 2:55 PM

## 2014-10-28 NOTE — Consult Note (Signed)
Chief Complaint: No chief complaint on file.   Referring Physician(s): Chrystal,Glenn  History of Present Illness: Marie Lowe is a 74 y.o. female with a history of lung cancer (right) and lymphoma ( neck). She has a new LUL lung nodule with is hypermetabolic. She is referred for biopsy.  Past Medical History  Diagnosis Date  . Hypertension   . High cholesterol   . GERD (gastroesophageal reflux disease)   . COPD (chronic obstructive pulmonary disease)   . Shortness of breath dyspnea   . Hodgkin's lymphoma   . Anemia     Past Surgical History  Procedure Laterality Date  . Abdominal hysterectomy    . Appendectomy    . Right lower lobe wedge resection      Allergies: Hydrochlorothiazide  Medications: Prior to Admission medications   Medication Sig Start Date End Date Taking? Authorizing Provider  albuterol (PROVENTIL HFA;VENTOLIN HFA) 108 (90 BASE) MCG/ACT inhaler Inhale into the lungs. 09/10/14 09/10/15 Yes Historical Provider, MD  amLODipine-benazepril (LOTREL) 10-40 MG per capsule Take 1 capsule by mouth daily.   Yes Historical Provider, MD  atorvastatin (LIPITOR) 10 MG tablet Take by mouth. 07/29/14 07/29/15 Yes Historical Provider, MD  budesonide-formoterol (SYMBICORT) 160-4.5 MCG/ACT inhaler Inhale 2 puffs into the lungs 2 (two) times daily.   Yes Historical Provider, MD  Calcium-Vitamin D (CALTRATE 600 PLUS-VIT D PO) Take 1 tablet by mouth 2 (two) times daily.   Yes Historical Provider, MD  carvedilol (COREG) 6.25 MG tablet Take 6.25 mg by mouth 2 (two) times daily with a meal.   Yes Historical Provider, MD  Multiple Vitamins-Minerals (MULTIVITAMIN WITH MINERALS) tablet Take 1 tablet by mouth daily.   Yes Historical Provider, MD  pyridOXINE (VITAMIN B-6) 100 MG tablet Take 100 mg by mouth daily.   Yes Historical Provider, MD  ranitidine (ZANTAC) 150 MG tablet  08/18/14  Yes Historical Provider, MD  tiotropium (SPIRIVA) 18 MCG inhalation capsule Place 18 mcg into  inhaler and inhale daily.   Yes Historical Provider, MD  antiseptic oral rinse (BIOTENE) LIQD by Mucous Membrane route.    Historical Provider, MD  [facility] Liver Oil 10 MINIM CAPS Take 1 capsule by mouth daily.    Historical Provider, MD  Cyanocobalamin 1000 MCG TBCR Take by mouth.    Historical Provider, MD     Family History  Problem Relation Age of Onset  . Diabetes Other   . CAD Other   . Cancer Other     colon cancer, breast cancer    History   Social History  . Marital Status: Married    Spouse Name: N/A  . Number of Children: N/A  . Years of Education: N/A   Social History Main Topics  . Smoking status: Former Smoker -- 1.00 packs/day for 18 years    Types: Cigarettes  . Smokeless tobacco: Never Used  . Alcohol Use: 0.6 oz/week    1 Glasses of wine per week     Comment: Occassionaly  . Drug Use: No  . Sexual Activity: Not on file   Other Topics Concern  . None   Social History Narrative      Review of Systems: A 12 point ROS discussed and pertinent positives are indicated in the HPI above.  All other systems are negative.  Review of Systems  Vital Signs: BP 152/73 mmHg  Pulse 79  Temp(Src) 98.1 F (36.7 C) (Oral)  Resp 18  Ht 5' 6.5" (1.689 m)  Wt 140 lb (63.504  kg)  BMI 22.26 kg/m2  SpO2 100%  Physical Exam  Constitutional: She is oriented to person, place, and time. She appears well-developed and well-nourished.  Neck: Normal range of motion.  Cardiovascular: Normal rate and regular rhythm.   Pulmonary/Chest: Effort normal and breath sounds normal.  Neurological: She is alert and oriented to person, place, and time.  Skin: Skin is warm and dry.    Mallampati Score:  MD Evaluation Airway: WNL Heart: WNL Abdomen: WNL Chest/ Lungs: WNL ASA  Classification: 2 Mallampati/Airway Score: One  Imaging: Nm Pet Image Restag (ps) Skull Base To Thigh  10/02/2014   CLINICAL DATA:  Subsequent treatment strategy for lung carcinoma and lymphoma. New left  upper lobe pulmonary nodule seen on recent CT.  EXAM: NUCLEAR MEDICINE PET SKULL BASE TO THIGH  TECHNIQUE: 12.2 mCi F-18 FDG was injected intravenously. Full-ring PET imaging was performed from the skull base to thigh after the radiotracer. CT data was obtained and used for attenuation correction and anatomic localization.  FASTING BLOOD GLUCOSE:  Value: 61 mg/dl  COMPARISON:  CT on 09/19/2014 and previous PET-CT on 04/03/2013  FINDINGS: NECK  No hypermetabolic lymph nodes in the neck.  CHEST  New 10 mm pulmonary nodule in the left upper lobe on image 84 is hypermetabolic, with SUV max of 4.9.  Spiculated nodule is seen in the superior segment right lower lobe on image 84 measuring 13 mm which has increased in size compared to previous study and shows mild hypermetabolic activity. A posterior subpleural right lower lobe nodule measures 15 mm which also shows mild increase compared to 11 mm on 2014 PET-CT. This has SUV max of 7.6 on today's study compared to 3.8 previously.  Other subpleural ill-defined nodular opacity in the posterior right lower lobe on image 109 shows mild decrease in size since previous study, and also shows decreased metabolic activity, with SUV max of 2.0 on today's study compared to 5.3 previously. Severe emphysema again noted.  Mild hypermetabolic lymphadenopathy in both hilar regions and mediastinum show no significant change. SUV max of index lymph node in the subcarinal region measures 3.4 compared to 3.7 previously.  ABDOMEN/PELVIS  No abnormal hypermetabolic activity within the liver, pancreas, adrenal glands, or spleen. Complex left upper pole renal lesion is difficult to visualized on noncontrast CT images but shows no associated hypermetabolic activity suggesting a benign etiology. No hypermetabolic lymph nodes in the abdomen or pelvis.  SKELETON  No focal hypermetabolic activity to suggest skeletal metastasis.  IMPRESSION: New 10 mm hypermetabolic pulmonary nodule in left upper lobe,  and mild increase and FDG activity associated with 2 hypermetabolic nodules in the right lower lobe, consistent with mild progression of metastatic disease.  Stable mild hypermetabolic mediastinal and hilar lymphadenopathy.  No evidence of metastatic disease within the neck, abdomen, or pelvis.   Electronically Signed   By: Earle Gell M.D.   On: 10/02/2014 17:22    Labs:  CBC:  Recent Labs  12/19/13 1055 03/13/14 0936 09/19/14 1009 10/28/14 1024  WBC 2.4* 2.7* 2.1* 1.8*  HGB 11.8* 11.5* 10.6* 11.7*  HCT 36.5 35.2 32.1* 35.1  PLT 171 156 185 134*    COAGS:  Recent Labs  10/28/14 1024  INR 0.96    BMP:  Recent Labs  03/13/14 0936 09/19/14 1009  NA 138 135  K 4.3 4.3  CL 103 103  CO2 30 27  GLUCOSE 82 85  BUN 21* 27*  CALCIUM 9.8 9.0  CREATININE 1.30 1.11*  GFRNONAA  --  49*  GFRAA  --  57*    LIVER FUNCTION TESTS:  Recent Labs  03/13/14 0936 09/19/14 1009  AST 66* 62*  ALT 72* 68*  ALKPHOS 444* 299*  PROT 8.1 6.9  ALBUMIN 3.4 3.1*    TUMOR MARKERS: No results for input(s): AFPTM, CEA, CA199, CHROMGRNA in the last 8760 hours.  Assessment and Plan:  LUL lung nodule. For biopsy. I discussed risk of pneumothorax and death.  Thank you for this interesting consult.  I greatly enjoyed meeting Marie Lowe and look forward to participating in their care.  Signed: Ahlayah Tarkowski, ART A 10/28/2014, 12:30 PM   I spent a total of  20 Minutes   in face to face in clinical consultation, greater than 50% of which was counseling/coordinating care for lung biopsy.

## 2014-10-28 NOTE — Progress Notes (Signed)
Patient ID: Marie Lowe, female   DOB: 1940/09/27, 74 y.o.   MRN: [id number]  The post biopsy CXR showed a 10% left pneumothorax. The patient remained asymptomatic. The follow up CXR at 4 pm showed stability of the pneumothorax. Again, she was stable and without any breathing difficulty. She will be discharged and return in the AM for a final CXR.

## 2014-10-28 NOTE — Procedures (Signed)
LUL lung nodule biopsy 18 g times three No comp

## 2014-10-29 ENCOUNTER — Other Ambulatory Visit (HOSPITAL_COMMUNITY): Payer: Self-pay | Admitting: Diagnostic Radiology

## 2014-10-29 ENCOUNTER — Ambulatory Visit
Admission: RE | Admit: 2014-10-29 | Discharge: 2014-10-29 | Disposition: A | Discharge status: Home / Self Care | Payer: Medicare Other | Source: Ambulatory Visit | Attending: Diagnostic Radiology | Admitting: Diagnostic Radiology

## 2014-10-29 DIAGNOSIS — J95811 Postprocedural pneumothorax: Secondary | ICD-10-CM

## 2014-10-29 DIAGNOSIS — Z9889 Other specified postprocedural states: Secondary | ICD-10-CM | POA: Diagnosis not present

## 2014-10-29 DIAGNOSIS — J849 Interstitial pulmonary disease, unspecified: Secondary | ICD-10-CM | POA: Diagnosis not present

## 2014-10-29 NOTE — Progress Notes (Signed)
Call placed to patient post lung biopsy pneumothorax after discussing chest x-ray results with Dr. Barbie Banner.  Patient states she has no pain and only some shortness of breath with exertion, but "feels good".  Importance stressed of coming to ER if severe pain/dyspnea, etc.  Patient given my office number for further questions/concerns.

## 2014-10-31 LAB — SURGICAL PATHOLOGY

## 2014-11-03 ENCOUNTER — Telehealth: Payer: Self-pay | Admitting: *Deleted

## 2014-11-04 NOTE — Telephone Encounter (Signed)
I spoek with pt who said she does not have an appt with Dr Baruch Gouty, I see she was a no show 5/23, I transferred her to Casper Harrison RN to reschedule appt

## 2014-11-04 NOTE — Telephone Encounter (Signed)
Squamous cell carcinoma.  Same as before.  Keep f/u with Dr. Donella Stade

## 2014-11-10 ENCOUNTER — Institutional Professional Consult (permissible substitution): Payer: Medicare Other | Admitting: Radiation Oncology

## 2014-11-12 ENCOUNTER — Ambulatory Visit
Admission: RE | Admit: 2014-11-12 | Discharge: 2014-11-12 | Disposition: A | Discharge status: Home / Self Care | Payer: Medicare Other | Source: Ambulatory Visit | Attending: Radiation Oncology | Admitting: Radiation Oncology

## 2014-11-12 ENCOUNTER — Encounter: Payer: Self-pay | Admitting: Radiation Oncology

## 2014-11-12 VITALS — BP 162/71 | HR 90 | Temp 96.8°F | Wt 149.5 lb

## 2014-11-12 DIAGNOSIS — C3412 Malignant neoplasm of upper lobe, left bronchus or lung: Secondary | ICD-10-CM | POA: Insufficient documentation

## 2014-11-12 DIAGNOSIS — Z510 Encounter for antineoplastic radiation therapy: Secondary | ICD-10-CM | POA: Insufficient documentation

## 2014-11-12 NOTE — Consult Note (Signed)
Radiation Oncology NEW PATIENT EVALUATION  Name: Marie Lowe  MRN: [id number]  Date:   11/12/2014     DOB: March 06, 1941   This 74 y.o. female patient presents to the clinic for initial evaluation of  Radiation Oncology NEW PATIENT EVALUATION  Name: Marie Lowe  MRN: [id number]  Date:   11/12/2014     DOB: 1941-02-28   This 74 y.o. 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  REFERRING PHYSICIAN: Marinda Elk, MD  CHIEF COMPLAINT: No chief complaint on file.   DIAGNOSIS: There were no encounter diagnoses.   PREVIOUS INVESTIGATIONS:  PET CT scan and CT scans reviewed Surgical pathology report reviewed Clinical notes reviewed  HPI: Patient is a 74 year old female well-known to department having treated over 2 years prior with involved field radiation therapy for Hodgkin's lymphoma. She's been tract for approximately one year for hypermetabolic mass is detected incidentally on follow-up PET/CT scans in the right lower lobe. Recently had a new nodularity in the left upper lobe. It was presented her tumor conference underwent biopsy CT-guided which was positive for squamous cell carcinoma. Except for loss of taste she is doing fairly well. She specifically denies cough hemoptysis or chest tightness. She has no evidence of disease of prior Hodgkin's lymphoma. Initially patient had refused any further treatment including chemotherapy. She does have leukopenia and neutropenia and blood counts are being followed and have been stable. She seen today for consideration of treatment were Upper lobe nodule.  PLANNED TREATMENT REGIMEN: SB RT to left upper lobe squamous cell carcinoma  PAST MEDICAL HISTORY:  has a past medical history of Hypertension; High cholesterol; GERD (gastroesophageal reflux disease); COPD (chronic obstructive pulmonary disease); Shortness of breath dyspnea; Hodgkin's lymphoma; and Anemia.     PAST SURGICAL HISTORY:  Past Surgical History  Procedure Laterality Date  . Abdominal hysterectomy    . Appendectomy    . Right lower lobe wedge resection      FAMILY HISTORY: family history includes CAD in her other; Cancer in her other; Diabetes in her other.  SOCIAL HISTORY:  reports that she has quit smoking. Her smoking use included Cigarettes. She has a 18 pack-year smoking history. She has never used smokeless tobacco. She reports that she drinks about 0.6 oz of alcohol per week. She reports that she does not use illicit drugs.  ALLERGIES: Hydrochlorothiazide  MEDICATIONS:  Current Outpatient Prescriptions  Medication Sig Dispense Refill  . albuterol (PROVENTIL HFA;VENTOLIN HFA) 108 (90 BASE) MCG/ACT inhaler Inhale into the lungs.    Marland Kitchen amLODipine-benazepril (LOTREL) 10-40 MG per capsule Take 1 capsule by mouth daily.    Marland Kitchen antiseptic oral rinse (BIOTENE) LIQD by Mucous Membrane route.    Marland Kitchen atorvastatin (LIPITOR) 10 MG tablet Take by mouth.    . budesonide-formoterol (SYMBICORT) 160-4.5 MCG/ACT inhaler Inhale 2 puffs into the lungs 2 (two) times daily.    . Calcium-Vitamin D (CALTRATE 600 PLUS-VIT D PO) Take 1 tablet by mouth 2 (two) times daily.    . carvedilol (COREG) 6.25 MG tablet Take 6.25 mg by mouth 2 (two) times daily with a meal.    . Cod Liver Oil 10 MINIM CAPS Take 1 capsule by mouth daily.    . Cyanocobalamin 1000 MCG TBCR Take by mouth.    . Multiple Vitamins-Minerals (MULTIVITAMIN WITH MINERALS) tablet Take 1 tablet by mouth daily.    Marland Kitchen pyridOXINE (VITAMIN B-6) 100 MG tablet Take 100 mg by  mouth daily.    . ranitidine (ZANTAC) 150 MG tablet     . tiotropium (SPIRIVA) 18 MCG inhalation capsule Place 18 mcg into inhaler and inhale daily.     No current facility-administered medications for this encounter.    ECOG PERFORMANCE STATUS:  0 - Asymptomatic  REVIEW OF SYSTEMS:  Patient denies any weight loss, fatigue, weakness, fever, chills or night sweats. Patient  denies any loss of vision, blurred vision. Patient denies any ringing  of the ears or hearing loss. No irregular heartbeat. Patient denies heart murmur or history of fainting. Patient denies any chest pain or pain radiating to her upper extremities. Patient denies any shortness of breath, difficulty breathing at night, cough or hemoptysis. Patient denies any swelling in the lower legs. Patient denies any nausea vomiting, vomiting of blood, or coffee ground material in the vomitus. Patient denies any stomach pain. Patient states has had normal bowel movements no significant constipation or diarrhea. Patient denies any dysuria, hematuria or significant nocturia. Patient denies any problems walking, swelling in the joints or loss of balance. Patient denies any skin changes, loss of hair or loss of weight. Patient denies any excessive worrying or anxiety or significant depression. Patient denies any problems with insomnia. Patient denies excessive thirst, polyuria, polydipsia. Patient denies any swollen glands, patient denies easy bruising or easy bleeding. Patient denies any recent infections, allergies or URI. Patient "s visual fields have not changed significantly in recent time.    PHYSICAL EXAM: There were no vitals taken for this visit. Well-developed well-nourished patient in NAD. HEENT reveals PERLA, EOMI, discs not visualized.  Oral cavity is clear. No oral mucosal lesions are identified. Neck is clear without evidence of cervical or supraclavicular adenopathy. Lungs are clear to A&P. Cardiac examination is essentially unremarkable with regular rate and rhythm without murmur rub or thrill. Abdomen is benign with no organomegaly or masses noted. Motor sensory and DTR levels are equal and symmetric in the upper and lower extremities. Cranial nerves II through XII are grossly intact. Proprioception is intact. No peripheral adenopathy or edema is identified. No motor or sensory levels are noted. Crude visual  fields are within normal range.   LABORATORY DATA: Surgical pathology report reviewed RADIOLOGY RESULTS: PET/CT and CT scans reviewed No results found.  IMPRESSION: New T1 squamous cell carcinoma the left upper lobe in 74 year old female with known history of Hodgkin's lymphoma status post chemotherapy plus involved field radiation. Patient also has 2 small peripheral nodules in the right lower lobe again hypermetabolic  PLAN: At this time I believe patient would be a good candidate for SB RT treatment to 5000 cGy in 5 fractions to her left upper lobe known squamous cell carcinoma. I'm also concerned about her right lower lobe lesions although these can be addressed in the future since there apparently growing a very slow rate based on serial PET/CT scans. She does areas start increasing in size can always treat them also with SB RT in the future. Risks and benefits of treatment including fatigue, alteration of blood counts, cough all were discussed in detail with the patient and her family. I have set her up for CT simulation with motion interface early next week.  I would like to take this opportunity for allowing me to participate in the care of your patient.Armstead Peaks., MD         .

## 2014-11-12 NOTE — Addendum Note (Signed)
Encounter addended by: Christean Grief, RN on: 11/12/2014  3:22 PM<BR>     Documentation filed: Vitals Section, Flowsheet VN, Medications, Orders

## 2014-11-18 ENCOUNTER — Ambulatory Visit
Admission: RE | Admit: 2014-11-18 | Discharge: 2014-11-18 | Disposition: A | Discharge status: Home / Self Care | Payer: Medicare Other | Source: Ambulatory Visit | Attending: Radiation Oncology | Admitting: Radiation Oncology

## 2014-11-18 DIAGNOSIS — C3412 Malignant neoplasm of upper lobe, left bronchus or lung: Secondary | ICD-10-CM | POA: Diagnosis not present

## 2014-11-18 DIAGNOSIS — Z510 Encounter for antineoplastic radiation therapy: Secondary | ICD-10-CM | POA: Diagnosis not present

## 2014-11-27 DIAGNOSIS — Z510 Encounter for antineoplastic radiation therapy: Secondary | ICD-10-CM | POA: Diagnosis not present

## 2014-12-03 ENCOUNTER — Ambulatory Visit
Admission: RE | Admit: 2014-12-03 | Discharge: 2014-12-03 | Disposition: A | Discharge status: Home / Self Care | Payer: Medicare Other | Source: Ambulatory Visit | Attending: Radiation Oncology | Admitting: Radiation Oncology

## 2014-12-03 DIAGNOSIS — Z510 Encounter for antineoplastic radiation therapy: Secondary | ICD-10-CM | POA: Diagnosis not present

## 2014-12-05 ENCOUNTER — Ambulatory Visit
Admission: RE | Admit: 2014-12-05 | Discharge: 2014-12-05 | Disposition: A | Discharge status: Home / Self Care | Payer: Medicare Other | Source: Ambulatory Visit | Attending: Radiation Oncology | Admitting: Radiation Oncology

## 2014-12-05 DIAGNOSIS — Z510 Encounter for antineoplastic radiation therapy: Secondary | ICD-10-CM | POA: Diagnosis not present

## 2014-12-10 ENCOUNTER — Encounter: Payer: Self-pay | Admitting: Radiation Oncology

## 2014-12-10 ENCOUNTER — Ambulatory Visit
Admission: RE | Admit: 2014-12-10 | Discharge: 2014-12-10 | Disposition: A | Discharge status: Home / Self Care | Payer: Medicare Other | Source: Ambulatory Visit | Attending: Radiation Oncology | Admitting: Radiation Oncology

## 2014-12-10 DIAGNOSIS — Z510 Encounter for antineoplastic radiation therapy: Secondary | ICD-10-CM | POA: Diagnosis not present

## 2014-12-12 ENCOUNTER — Ambulatory Visit
Admission: RE | Admit: 2014-12-12 | Discharge: 2014-12-12 | Disposition: A | Discharge status: Home / Self Care | Payer: Medicare Other | Source: Ambulatory Visit | Attending: Radiation Oncology | Admitting: Radiation Oncology

## 2014-12-12 DIAGNOSIS — Z510 Encounter for antineoplastic radiation therapy: Secondary | ICD-10-CM | POA: Diagnosis not present

## 2014-12-17 ENCOUNTER — Ambulatory Visit
Admission: RE | Admit: 2014-12-17 | Discharge: 2014-12-17 | Disposition: A | Discharge status: Home / Self Care | Payer: Medicare Other | Source: Ambulatory Visit | Attending: Radiation Oncology | Admitting: Radiation Oncology

## 2014-12-17 DIAGNOSIS — Z510 Encounter for antineoplastic radiation therapy: Secondary | ICD-10-CM | POA: Diagnosis not present

## 2015-01-23 ENCOUNTER — Ambulatory Visit
Admission: RE | Admit: 2015-01-23 | Discharge: 2015-01-23 | Disposition: A | Discharge status: Home / Self Care | Payer: Medicare Other | Source: Ambulatory Visit | Attending: Radiation Oncology | Admitting: Radiation Oncology

## 2015-01-23 ENCOUNTER — Encounter: Payer: Self-pay | Admitting: Radiation Oncology

## 2015-01-23 VITALS — BP 126/54 | HR 92 | Temp 98.0°F | Resp 20 | Ht 66.0 in | Wt 152.4 lb

## 2015-01-23 DIAGNOSIS — Z85118 Personal history of other malignant neoplasm of bronchus and lung: Secondary | ICD-10-CM

## 2015-01-23 NOTE — Progress Notes (Signed)
Patient states that she is having a productive cough intermittently.  Denies any other concerns at this time.

## 2015-01-23 NOTE — Progress Notes (Signed)
Radiation Oncology Follow up Note  Name: Marie Lowe   Date:   01/23/2015 MRN:  [id number] DOB: September 15, 1940    This 74 y.o. 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  REFERRING PROVIDER: Marinda Elk, MD  HPI: Patient is a 74 year old female now out 1 month completing SB RT for a left upper lobe squamous cell carcinoma stage I disease. She is seen today in routine follow-up and is doing well. Patient does have history of being treated over 2 years prior with involved field radiation therapy for Hodgkin's lymphoma. Her only concern is diminished appetite although her weight is stable. She specifically denies cough hemoptysis or chest tightness. She states she actually doesn't even know she was treated..  COMPLICATIONS OF TREATMENT: none  FOLLOW UP COMPLIANCE: keeps appointments   PHYSICAL EXAM:  BP 126/54 mmHg  Pulse 92  Temp(Src) 98 F (36.7 C)  Resp 20  Ht '5\' 6"'$  (1.676 m)  Wt 152 lb 7.2 oz (69.15 kg)  BMI 24.62 kg/m2 Well-developed well-nourished patient in NAD. HEENT reveals PERLA, EOMI, discs not visualized.  Oral cavity is clear. No oral mucosal lesions are identified. Neck is clear without evidence of cervical or supraclavicular adenopathy. Lungs are clear to A&P. Cardiac examination is essentially unremarkable with regular rate and rhythm without murmur rub or thrill. Abdomen is benign with no organomegaly or masses noted. Motor sensory and DTR levels are equal and symmetric in the upper and lower extremities. Cranial nerves II through XII are grossly intact. Proprioception is intact. No peripheral adenopathy or edema is identified. No motor or sensory levels are noted. Crude visual fields are within normal range.   RADIOLOGY RESULTS: CT scan has been ordered in 3 months.  PLAN: Present time she is doing well. Clinically stable with no significant side effects from SB RT. I am please were overall progress.  I've ordered a CT scan about 3 months and asked to see her back shortly thereafter for follow-up. Patient and husband know to call sooner with any concerns.  I would like to take this opportunity for allowing me to participate in the care of your patient.Armstead Peaks., MD

## 2015-02-09 ENCOUNTER — Inpatient Hospital Stay: Payer: Medicare Other | Attending: Oncology

## 2015-02-09 ENCOUNTER — Inpatient Hospital Stay: Payer: Medicare Other

## 2015-02-09 ENCOUNTER — Telehealth: Payer: Self-pay | Admitting: *Deleted

## 2015-02-09 ENCOUNTER — Ambulatory Visit
Admission: RE | Admit: 2015-02-09 | Discharge: 2015-02-09 | Disposition: A | Discharge status: Home / Self Care | Payer: Medicare Other | Source: Ambulatory Visit | Attending: Oncology | Admitting: Oncology

## 2015-02-09 DIAGNOSIS — J449 Chronic obstructive pulmonary disease, unspecified: Secondary | ICD-10-CM | POA: Diagnosis not present

## 2015-02-09 DIAGNOSIS — C801 Malignant (primary) neoplasm, unspecified: Secondary | ICD-10-CM

## 2015-02-09 DIAGNOSIS — D61818 Other pancytopenia: Secondary | ICD-10-CM | POA: Insufficient documentation

## 2015-02-09 DIAGNOSIS — E780 Pure hypercholesterolemia: Secondary | ICD-10-CM | POA: Diagnosis not present

## 2015-02-09 DIAGNOSIS — Z9221 Personal history of antineoplastic chemotherapy: Secondary | ICD-10-CM | POA: Diagnosis not present

## 2015-02-09 DIAGNOSIS — C3431 Malignant neoplasm of lower lobe, right bronchus or lung: Secondary | ICD-10-CM

## 2015-02-09 DIAGNOSIS — Z800 Family history of malignant neoplasm of digestive organs: Secondary | ICD-10-CM | POA: Diagnosis not present

## 2015-02-09 DIAGNOSIS — Z923 Personal history of irradiation: Secondary | ICD-10-CM | POA: Insufficient documentation

## 2015-02-09 DIAGNOSIS — C8190 Hodgkin lymphoma, unspecified, unspecified site: Secondary | ICD-10-CM | POA: Insufficient documentation

## 2015-02-09 DIAGNOSIS — Z87891 Personal history of nicotine dependence: Secondary | ICD-10-CM | POA: Insufficient documentation

## 2015-02-09 DIAGNOSIS — C3410 Malignant neoplasm of upper lobe, unspecified bronchus or lung: Secondary | ICD-10-CM | POA: Insufficient documentation

## 2015-02-09 DIAGNOSIS — Z803 Family history of malignant neoplasm of breast: Secondary | ICD-10-CM | POA: Insufficient documentation

## 2015-02-09 DIAGNOSIS — I10 Essential (primary) hypertension: Secondary | ICD-10-CM | POA: Diagnosis not present

## 2015-02-09 DIAGNOSIS — C3412 Malignant neoplasm of upper lobe, left bronchus or lung: Secondary | ICD-10-CM | POA: Insufficient documentation

## 2015-02-09 DIAGNOSIS — Z452 Encounter for adjustment and management of vascular access device: Secondary | ICD-10-CM | POA: Diagnosis not present

## 2015-02-09 DIAGNOSIS — Z79899 Other long term (current) drug therapy: Secondary | ICD-10-CM | POA: Insufficient documentation

## 2015-02-09 LAB — COMPREHENSIVE METABOLIC PANEL
ALT: 39 U/L (ref 14–54)
ANION GAP: 4 — AB (ref 5–15)
AST: 51 U/L — ABNORMAL HIGH (ref 15–41)
Albumin: 3.3 g/dL — ABNORMAL LOW (ref 3.5–5.0)
Alkaline Phosphatase: 380 U/L — ABNORMAL HIGH (ref 38–126)
BUN: 27 mg/dL — ABNORMAL HIGH (ref 6–20)
CHLORIDE: 107 mmol/L (ref 101–111)
CO2: 25 mmol/L (ref 22–32)
Calcium: 10.2 mg/dL (ref 8.9–10.3)
Creatinine, Ser: 1.27 mg/dL — ABNORMAL HIGH (ref 0.44–1.00)
GFR calc non Af Amer: 41 mL/min — ABNORMAL LOW (ref >60)
GFR, EST AFRICAN AMERICAN: 47 mL/min — AB (ref >60)
Glucose, Bld: 95 mg/dL (ref 65–99)
Potassium: 4.1 mmol/L (ref 3.5–5.1)
SODIUM: 136 mmol/L (ref 135–145)
Total Bilirubin: 1.5 mg/dL — ABNORMAL HIGH (ref 0.3–1.2)
Total Protein: 7.4 g/dL (ref 6.5–8.1)

## 2015-02-09 LAB — CBC WITH DIFFERENTIAL/PLATELET
BASOS PCT: 1 %
Basophils Absolute: 0 10*3/uL (ref 0–0.1)
EOS ABS: 0 10*3/uL (ref 0–0.7)
Eosinophils Relative: 1 %
HCT: 31.6 % — ABNORMAL LOW (ref 35.0–47.0)
Hemoglobin: 10.6 g/dL — ABNORMAL LOW (ref 12.0–16.0)
Lymphocytes Relative: 38 %
Lymphs Abs: 0.9 10*3/uL — ABNORMAL LOW (ref 1.0–3.6)
MCH: 31 pg (ref 26.0–34.0)
MCHC: 33.4 g/dL (ref 32.0–36.0)
MCV: 92.9 fL (ref 80.0–100.0)
Monocytes Absolute: 1.2 10*3/uL — ABNORMAL HIGH (ref 0.2–0.9)
Monocytes Relative: 52 %
NEUTROS PCT: 8 %
Neutro Abs: 0.2 10*3/uL — ABNORMAL LOW (ref 1.4–6.5)
PLATELETS: 127 10*3/uL — AB (ref 150–440)
RBC: 3.41 MIL/uL — AB (ref 3.80–5.20)
RDW: 18.6 % — ABNORMAL HIGH (ref 11.5–14.5)
WBC: 2.3 10*3/uL — AB (ref 3.6–11.0)

## 2015-02-09 LAB — LACTATE DEHYDROGENASE: LDH: 143 U/L (ref 98–192)

## 2015-02-09 MED ORDER — HEPARIN SOD (PORK) LOCK FLUSH 100 UNIT/ML IV SOLN: 500.0000 [IU] | Freq: Once | INTRAVENOUS | Status: AC

## 2015-02-09 MED ORDER — SODIUM CHLORIDE 0.9 % IJ SOLN
10.0000 mL | INTRAMUSCULAR | Status: AC | PRN
  Administered 2015-02-09: 10 mL via INTRAVENOUS
  Filled 2015-02-09: qty 10

## 2015-02-09 MED ORDER — HEPARIN SOD (PORK) LOCK FLUSH 100 UNIT/ML IV SOLN
500.0000 [IU] | Freq: Once | INTRAVENOUS | Status: AC
  Administered 2015-02-09: 500 [IU] via INTRAVENOUS

## 2015-02-09 MED ORDER — IOHEXOL 300 MG/ML  SOLN
60.0000 mL | Freq: Once | INTRAMUSCULAR | Status: AC | PRN
  Administered 2015-02-09: 60 mL via INTRAVENOUS

## 2015-02-09 NOTE — Telephone Encounter (Signed)
ANC 0.2 Dr Grayland Ormond informed and after checking chart, stated she is not far form her baseline and so long as she does not have fever he is not worried about it

## 2015-02-09 NOTE — Progress Notes (Unsigned)
Patient to CT scan with huber needle intact.

## 2015-02-09 NOTE — Telephone Encounter (Signed)
Icalled to check on patient, she denies fever

## 2015-02-11 ENCOUNTER — Inpatient Hospital Stay (HOSPITAL_BASED_OUTPATIENT_CLINIC_OR_DEPARTMENT_OTHER): Payer: Medicare Other | Admitting: Oncology

## 2015-02-11 VITALS — BP 111/62 | HR 97 | Temp 96.4°F | Resp 18 | Wt 147.9 lb

## 2015-02-11 DIAGNOSIS — Z800 Family history of malignant neoplasm of digestive organs: Secondary | ICD-10-CM

## 2015-02-11 DIAGNOSIS — D61818 Other pancytopenia: Secondary | ICD-10-CM | POA: Diagnosis not present

## 2015-02-11 DIAGNOSIS — Z923 Personal history of irradiation: Secondary | ICD-10-CM

## 2015-02-11 DIAGNOSIS — C3412 Malignant neoplasm of upper lobe, left bronchus or lung: Secondary | ICD-10-CM

## 2015-02-11 DIAGNOSIS — Z803 Family history of malignant neoplasm of breast: Secondary | ICD-10-CM

## 2015-02-11 DIAGNOSIS — I10 Essential (primary) hypertension: Secondary | ICD-10-CM

## 2015-02-11 DIAGNOSIS — J449 Chronic obstructive pulmonary disease, unspecified: Secondary | ICD-10-CM

## 2015-02-11 DIAGNOSIS — C8190 Hodgkin lymphoma, unspecified, unspecified site: Secondary | ICD-10-CM | POA: Diagnosis not present

## 2015-02-11 DIAGNOSIS — Z79899 Other long term (current) drug therapy: Secondary | ICD-10-CM

## 2015-02-11 DIAGNOSIS — E780 Pure hypercholesterolemia: Secondary | ICD-10-CM

## 2015-02-11 DIAGNOSIS — Z87891 Personal history of nicotine dependence: Secondary | ICD-10-CM

## 2015-02-11 NOTE — Progress Notes (Signed)
Patient does have occasional SOBr.

## 2015-02-19 NOTE — Progress Notes (Signed)
[facility]  [phone] [phone]  ID: Marie Lowe OB: 26-Jun-1940  MR#: [id number]  BTD#:[id number]  Patient Care Team: Marinda Elk, MD as PCP - General (Physician Assistant)  CHIEF COMPLAINT:  Chief Complaint  Patient presents with  . Follow-up    lung cancer/discuss results    INTERVAL HISTORY: Patient returns to clinic today for routine evaluation and discussion of her CT scan results. She continues to feel well and remains asymptomatic.  She has a good appetite and her weight is stable. She denies any fevers or night sweats.  She denies any weakness or fatigue. She has no neurologic complaints.  She denies any nausea, vomiting, constipation, or diarrhea. She has no chest pain, cough, or shortness of breath. She has no urinary complaints.  Patient offers no specific complaints today.  REVIEW OF SYSTEMS:   Review of Systems  Constitutional: Negative.   Cardiovascular: Negative.   Endo/Heme/Allergies: Negative.     As per HPI. Otherwise, a complete review of systems is negatve.  PAST MEDICAL HISTORY: Past Medical History  Diagnosis Date  . Hypertension   . High cholesterol   . GERD (gastroesophageal reflux disease)   . COPD (chronic obstructive pulmonary disease)   . Shortness of breath dyspnea   . Hodgkin's lymphoma   . Anemia     PAST SURGICAL HISTORY: Past Surgical History  Procedure Laterality Date  . Abdominal hysterectomy    . Appendectomy    . Right lower lobe wedge resection      FAMILY HISTORY Family History  Problem Relation Age of Onset  . Diabetes Other   . CAD Other   . Cancer Other     colon cancer, breast cancer       ADVANCED DIRECTIVES:    HEALTH MAINTENANCE: Social History  Substance Use Topics  . Smoking status: Former Smoker -- 1.00 packs/day for 18 years    Types: Cigarettes  . Smokeless tobacco: Never Used  . Alcohol Use: 0.6 oz/week    1 Glasses of wine per week   Comment: Occassionaly     Colonoscopy:  PAP:  Bone density:  Lipid panel:  Allergies  Allergen Reactions  . Hydrochlorothiazide Other (See Comments)    Elevation of creatinine Patient doesn't remember what reaction she had to it.    Current Outpatient Prescriptions  Medication Sig Dispense Refill  . albuterol (PROVENTIL HFA;VENTOLIN HFA) 108 (90 BASE) MCG/ACT inhaler Inhale 2 puffs into the lungs every 6 (six) hours as needed for wheezing or shortness of breath.    Marland Kitchen amLODipine-benazepril (LOTREL) 10-40 MG per capsule Take 1 capsule by mouth daily.  3  . atorvastatin (LIPITOR) 10 MG tablet     . carvedilol (COREG) 6.25 MG tablet Take 6.25 mg by mouth 2 (two) times daily.  5  . mirtazapine (REMERON) 15 MG tablet TAKE 1 TABLET (15 MG TOTAL) BY MOUTH NIGHTLY.  3  . ranitidine (ZANTAC) 150 MG tablet     . SPIRIVA HANDIHALER 18 MCG inhalation capsule      No current facility-administered medications for this visit.   Facility-Administered Medications Ordered in Other Visits  Medication Dose Route Frequency Provider Last Rate Last Dose  . heparin lock flush 100 unit/mL  500 Units Intravenous Once Lloyd Huger, MD      . sodium chloride 0.9 % injection 10 mL  10 mL Intravenous PRN Lloyd Huger, MD   10 mL at 02/09/15 0844    OBJECTIVE: Filed Vitals:  02/11/15 1018  BP: 111/62  Pulse: 97  Temp: 96.4 F (35.8 C)  Resp: 18     Body mass index is 23.89 kg/(m^2).    ECOG FS:0 - Asymptomatic  General: Well-developed, well-nourished, no acute distress. Eyes: anicteric sclera. HEENT: No palpable lymphadenopathy.  Lungs: Clear to auscultation bilaterally. Heart: Regular rate and rhythm. No rubs, murmurs, or gallops. Abdomen: Soft, nontender, nondistended. No organomegaly noted, normoactive bowel sounds. Musculoskeletal: No edema, cyanosis, or clubbing. Neuro: Alert, answering all questions appropriately. Cranial nerves grossly intact. Skin: No rashes or petechiae  noted. Psych: Normal affect.   LAB RESULTS:  Lab Results  Component Value Date   NA 136 02/09/2015   K 4.1 02/09/2015   CL 107 02/09/2015   CO2 25 02/09/2015   GLUCOSE 95 02/09/2015   BUN 27* 02/09/2015   CREATININE 1.27* 02/09/2015   CALCIUM 10.2 02/09/2015   PROT 7.4 02/09/2015   ALBUMIN 3.3* 02/09/2015   AST 51* 02/09/2015   ALT 39 02/09/2015   ALKPHOS 380* 02/09/2015   BILITOT 1.5* 02/09/2015   GFRNONAA 41* 02/09/2015   GFRAA 47* 02/09/2015    Lab Results  Component Value Date   WBC 2.3* 02/09/2015   NEUTROABS 0.2* 02/09/2015   HGB 10.6* 02/09/2015   HCT 31.6* 02/09/2015   MCV 92.9 02/09/2015   PLT 127* 02/09/2015     STUDIES: Ct Chest W Contrast  02/09/2015   CLINICAL DATA:  Hodgkin's lymphoma status post chemotherapy and radiation 2 years ago. History of lung cancer, radiation therapy March 2016. Cough and shortness of breath.  EXAM: CT CHEST WITH CONTRAST  TECHNIQUE: Multidetector CT imaging of the chest was performed during intravenous contrast administration.  CONTRAST:  70m OMNIPAQUE IOHEXOL 300 MG/ML  SOLN  COMPARISON:  Chest radiograph 10/29/2014, chest CT 09/19/2014  FINDINGS: Mediastinum/Nodes: Heart size is normal. Small pericardial effusion is stable. Moderate atheromatous aortic and branch vessel calcifications are reidentified.  Right-sided Port-A-Cath with tip in the mid SVC. Subcarinal lymph node measures 0.8 cm image 28, previously 1.0 cm.  Right hilar node measures 1.1 cm image 27, not significantly changed.  Stable 0.6 cm left hilar lymph node.  Lungs/Pleura: The previously biopsied dominant left upper lobe pulmonary nodule is less solid than previously, in subjective appearance, but more nodular in appearance and increased in size now 1.8 x 1.6 cm image 31.  Irregular right lower lobe pulmonary parenchymal nodule measures 1.3 x 1.3 cm image 31.  Subpleural right lower lobe irregular nodule measures 1.9 x 1.7 cm on image 34, previously 2.0 by 1.4 cm by  my measurements. Curvilinear right lower lobe subpleural scarring or atelectasis is subjectively increased at the right lung base. There is also increased patchy irregular nodularity within the basal aspect of the right lower lobe measuring 2.2 x 1.6 cm image 48. No significant pleural effusion.  Diffuse severe emphysematous change. Central airways are patent but demonstrate central bronchial wall thickening, most subjectively prominent in the right lower lobe.  Upper abdomen: Innumerable renal cortical hypodense lesions are reidentified. These are of varying density and could represent cysts but are not completely characterized. 1.3 cm intermediate density left mid renal cortical mass image 68 is larger than 1.0 cm previous measurement.  Adrenal glands are full bilaterally without focal measurable mass.  Musculoskeletal: No acute osseous abnormality or lytic or sclerotic osseous lesion.  IMPRESSION: Increase in size and nodularity of biopsied left upper lobe primary lung cancer.  Mixed response, with decrease in size of a representative sub carinal  lymph node increase in size of at least 1 irregular right lower lobe pulmonary parenchymal nodule. This could represent synchronous/metastatic disease.  Predominant right lower lobe central bronchial wall thickening with increased patchy right lower lobe airspace opacity which could indicate bronchitis superimposed on emphysema with developing infection/ inflammation although tumor could appear similar.  Increase in size of left upper renal pole lesion in the setting of probable varying density cysts elsewhere. This raises the question of metastatic versus primary neoplasm, including lymphoma, and is amenable to followup at presumed future studies.   Electronically Signed   By: Conchita Paris M.D.   On: 02/09/2015 10:47    ASSESSMENT: Clinical stage II Hodgkin lymphoma, pathologic stage I squamous cell carcinoma of the lung.  PLAN:    1.  Hodgkin's lymphoma: No  evidence of disease.  Previously noted pulmonary nodule consistent with lung cancer.  Continue to monitor routinely with CT scans. 2.  Lung cancer: CT scan results reviewed independently and reported as above. Patient recently completed XRT to new lung nodule which was biopsied and confirmed squamous cell carcinoma.   Case was discussed at cancer conference and a recommendation was to continue to monitor with periodic CT scans. Previously, patient refused any further surgery or chemotherapy. Return to clinic in 3 months with repeat imaging and further evaluation.  3.  Pancytopenia: Patient's WBC count, hemoglobin, and platelet counts are all decreased but essentially unchanged and stable. She is also asymptomatic. No intervention is needed at this time. This is possibly related to myelosuppression from previous chemotherapy from Hodgkin's. Continue to monitor.    Patient expressed understanding and was in agreement with this plan. She also understands that She can call clinic at any time with any questions, concerns, or complaints.   Lloyd Huger, MD   02/19/2015 3:55 PM

## 2015-05-12 ENCOUNTER — Inpatient Hospital Stay: Payer: Medicare Other

## 2015-05-12 ENCOUNTER — Inpatient Hospital Stay: Payer: Medicare Other | Attending: Oncology

## 2015-05-12 DIAGNOSIS — K219 Gastro-esophageal reflux disease without esophagitis: Secondary | ICD-10-CM | POA: Diagnosis not present

## 2015-05-12 DIAGNOSIS — I10 Essential (primary) hypertension: Secondary | ICD-10-CM | POA: Diagnosis not present

## 2015-05-12 DIAGNOSIS — Z452 Encounter for adjustment and management of vascular access device: Secondary | ICD-10-CM | POA: Insufficient documentation

## 2015-05-12 DIAGNOSIS — E7800 Pure hypercholesterolemia, unspecified: Secondary | ICD-10-CM | POA: Diagnosis not present

## 2015-05-12 DIAGNOSIS — Z923 Personal history of irradiation: Secondary | ICD-10-CM | POA: Diagnosis not present

## 2015-05-12 DIAGNOSIS — C3431 Malignant neoplasm of lower lobe, right bronchus or lung: Secondary | ICD-10-CM | POA: Insufficient documentation

## 2015-05-12 DIAGNOSIS — R5383 Other fatigue: Secondary | ICD-10-CM | POA: Insufficient documentation

## 2015-05-12 DIAGNOSIS — I2510 Atherosclerotic heart disease of native coronary artery without angina pectoris: Secondary | ICD-10-CM | POA: Diagnosis not present

## 2015-05-12 DIAGNOSIS — C3412 Malignant neoplasm of upper lobe, left bronchus or lung: Secondary | ICD-10-CM

## 2015-05-12 DIAGNOSIS — D61818 Other pancytopenia: Secondary | ICD-10-CM | POA: Diagnosis not present

## 2015-05-12 DIAGNOSIS — Z803 Family history of malignant neoplasm of breast: Secondary | ICD-10-CM | POA: Diagnosis not present

## 2015-05-12 DIAGNOSIS — Z79899 Other long term (current) drug therapy: Secondary | ICD-10-CM | POA: Diagnosis not present

## 2015-05-12 DIAGNOSIS — Z9221 Personal history of antineoplastic chemotherapy: Secondary | ICD-10-CM | POA: Insufficient documentation

## 2015-05-12 DIAGNOSIS — J449 Chronic obstructive pulmonary disease, unspecified: Secondary | ICD-10-CM | POA: Diagnosis not present

## 2015-05-12 DIAGNOSIS — C8190 Hodgkin lymphoma, unspecified, unspecified site: Secondary | ICD-10-CM | POA: Diagnosis not present

## 2015-05-12 DIAGNOSIS — Z87891 Personal history of nicotine dependence: Secondary | ICD-10-CM | POA: Diagnosis not present

## 2015-05-12 DIAGNOSIS — Z95828 Presence of other vascular implants and grafts: Secondary | ICD-10-CM

## 2015-05-12 LAB — BASIC METABOLIC PANEL
Anion gap: 8 (ref 5–15)
BUN: 20 mg/dL (ref 6–20)
CALCIUM: 9.6 mg/dL (ref 8.9–10.3)
CO2: 24 mmol/L (ref 22–32)
Chloride: 105 mmol/L (ref 101–111)
Creatinine, Ser: 1.04 mg/dL — ABNORMAL HIGH (ref 0.44–1.00)
GFR calc Af Amer: 60 mL/min — ABNORMAL LOW (ref >60)
GFR, EST NON AFRICAN AMERICAN: 52 mL/min — AB (ref >60)
Glucose, Bld: 88 mg/dL (ref 65–99)
Potassium: 4.2 mmol/L (ref 3.5–5.1)
Sodium: 137 mmol/L (ref 135–145)

## 2015-05-12 LAB — CBC WITH DIFFERENTIAL/PLATELET
Basophils Absolute: 0 10*3/uL (ref 0–0.1)
Basophils Relative: 1 %
EOS ABS: 0.1 10*3/uL (ref 0–0.7)
EOS PCT: 3 %
HCT: 34.6 % — ABNORMAL LOW (ref 35.0–47.0)
Hemoglobin: 11.4 g/dL — ABNORMAL LOW (ref 12.0–16.0)
LYMPHS ABS: 1 10*3/uL (ref 1.0–3.6)
Lymphocytes Relative: 48 %
MCH: 30.3 pg (ref 26.0–34.0)
MCHC: 33.1 g/dL (ref 32.0–36.0)
MCV: 91.7 fL (ref 80.0–100.0)
MONO ABS: 0.8 10*3/uL (ref 0.2–0.9)
MONOS PCT: 39 %
Neutro Abs: 0.2 10*3/uL — ABNORMAL LOW (ref 1.7–7.7)
Neutrophils Relative %: 9 %
PLATELETS: 174 10*3/uL (ref 150–440)
RBC: 3.77 MIL/uL — ABNORMAL LOW (ref 3.80–5.20)
RDW: 19.2 % — AB (ref 11.5–14.5)
WBC: 2.1 10*3/uL — AB (ref 3.6–11.0)

## 2015-05-12 LAB — LACTATE DEHYDROGENASE: LDH: 158 U/L (ref 98–192)

## 2015-05-12 MED ORDER — SODIUM CHLORIDE 0.9 % IJ SOLN
10.0000 mL | INTRAMUSCULAR | Status: DC | PRN
  Administered 2015-05-12: 10 mL via INTRAVENOUS
  Filled 2015-05-12: qty 10

## 2015-05-12 MED ORDER — HEPARIN SOD (PORK) LOCK FLUSH 100 UNIT/ML IV SOLN
500.0000 [IU] | Freq: Once | INTRAVENOUS | Status: AC
  Administered 2015-05-12: 500 [IU] via INTRAVENOUS

## 2015-05-13 ENCOUNTER — Ambulatory Visit
Admission: RE | Admit: 2015-05-13 | Discharge: 2015-05-13 | Disposition: A | Discharge status: Home / Self Care | Payer: Medicare Other | Source: Ambulatory Visit | Attending: Oncology | Admitting: Oncology

## 2015-05-13 ENCOUNTER — Inpatient Hospital Stay: Payer: Medicare Other

## 2015-05-13 DIAGNOSIS — K449 Diaphragmatic hernia without obstruction or gangrene: Secondary | ICD-10-CM | POA: Insufficient documentation

## 2015-05-13 DIAGNOSIS — C3412 Malignant neoplasm of upper lobe, left bronchus or lung: Secondary | ICD-10-CM | POA: Insufficient documentation

## 2015-05-13 DIAGNOSIS — I2510 Atherosclerotic heart disease of native coronary artery without angina pectoris: Secondary | ICD-10-CM | POA: Diagnosis not present

## 2015-05-13 DIAGNOSIS — Z95828 Presence of other vascular implants and grafts: Secondary | ICD-10-CM

## 2015-05-13 DIAGNOSIS — R918 Other nonspecific abnormal finding of lung field: Secondary | ICD-10-CM | POA: Diagnosis not present

## 2015-05-13 DIAGNOSIS — C3431 Malignant neoplasm of lower lobe, right bronchus or lung: Secondary | ICD-10-CM | POA: Diagnosis not present

## 2015-05-13 MED ORDER — HEPARIN SOD (PORK) LOCK FLUSH 100 UNIT/ML IV SOLN
500.0000 [IU] | Freq: Once | INTRAVENOUS | Status: AC
  Administered 2015-05-13: 500 [IU] via INTRAVENOUS
  Filled 2015-05-13: qty 5

## 2015-05-13 MED ORDER — IOHEXOL 350 MG/ML SOLN
50.0000 mL | Freq: Once | INTRAVENOUS | Status: AC | PRN
  Administered 2015-05-13: 50 mL via INTRAVENOUS

## 2015-05-14 ENCOUNTER — Other Ambulatory Visit: Payer: Self-pay | Admitting: Physician Assistant

## 2015-05-14 DIAGNOSIS — Z1231 Encounter for screening mammogram for malignant neoplasm of breast: Secondary | ICD-10-CM

## 2015-05-18 ENCOUNTER — Inpatient Hospital Stay (HOSPITAL_BASED_OUTPATIENT_CLINIC_OR_DEPARTMENT_OTHER): Payer: Medicare Other | Admitting: Oncology

## 2015-05-18 VITALS — BP 147/70 | HR 90 | Temp 97.1°F | Resp 16 | Wt 155.4 lb

## 2015-05-18 DIAGNOSIS — Z9221 Personal history of antineoplastic chemotherapy: Secondary | ICD-10-CM

## 2015-05-18 DIAGNOSIS — R5383 Other fatigue: Secondary | ICD-10-CM

## 2015-05-18 DIAGNOSIS — D61818 Other pancytopenia: Secondary | ICD-10-CM

## 2015-05-18 DIAGNOSIS — C3431 Malignant neoplasm of lower lobe, right bronchus or lung: Secondary | ICD-10-CM | POA: Diagnosis not present

## 2015-05-18 DIAGNOSIS — K219 Gastro-esophageal reflux disease without esophagitis: Secondary | ICD-10-CM

## 2015-05-18 DIAGNOSIS — Z923 Personal history of irradiation: Secondary | ICD-10-CM | POA: Diagnosis not present

## 2015-05-18 DIAGNOSIS — C8190 Hodgkin lymphoma, unspecified, unspecified site: Secondary | ICD-10-CM

## 2015-05-18 DIAGNOSIS — J449 Chronic obstructive pulmonary disease, unspecified: Secondary | ICD-10-CM

## 2015-05-18 DIAGNOSIS — E7800 Pure hypercholesterolemia, unspecified: Secondary | ICD-10-CM

## 2015-05-18 DIAGNOSIS — I2510 Atherosclerotic heart disease of native coronary artery without angina pectoris: Secondary | ICD-10-CM

## 2015-05-18 DIAGNOSIS — I10 Essential (primary) hypertension: Secondary | ICD-10-CM

## 2015-05-18 DIAGNOSIS — Z87891 Personal history of nicotine dependence: Secondary | ICD-10-CM

## 2015-05-18 NOTE — Progress Notes (Signed)
Patient denies any problems today.

## 2015-05-21 ENCOUNTER — Ambulatory Visit
Admission: RE | Admit: 2015-05-21 | Discharge: 2015-05-21 | Disposition: A | Discharge status: Home / Self Care | Payer: Medicare Other | Source: Ambulatory Visit | Attending: Physician Assistant | Admitting: Physician Assistant

## 2015-05-21 DIAGNOSIS — Z1231 Encounter for screening mammogram for malignant neoplasm of breast: Secondary | ICD-10-CM | POA: Diagnosis present

## 2015-05-29 ENCOUNTER — Ambulatory Visit
Admission: RE | Admit: 2015-05-29 | Discharge: 2015-05-29 | Disposition: A | Discharge status: Home / Self Care | Payer: Medicare Other | Source: Ambulatory Visit | Attending: Radiation Oncology | Admitting: Radiation Oncology

## 2015-05-29 ENCOUNTER — Encounter: Payer: Self-pay | Admitting: Radiation Oncology

## 2015-05-29 VITALS — BP 143/63 | HR 102 | Temp 95.7°F | Resp 18 | Wt 148.5 lb

## 2015-05-29 DIAGNOSIS — Z85118 Personal history of other malignant neoplasm of bronchus and lung: Secondary | ICD-10-CM

## 2015-05-29 NOTE — Progress Notes (Signed)
Radiation Oncology Follow up Note  Name: Marie Lowe   Date:   05/29/2015 MRN:  [id number] DOB: November 03, 1940    This 74 y.o. 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  REFERRING PROVIDER: Marinda Elk, MD  HPI: Patient is a 74 year old female now out 5 months having completed SB RT to her left upper lobe for squamous cell carcinoma stage I. She also has a history of Hodgkin's disease being treated with involved field radiation 2 years prior she seen today in routine follow-up and is doing well has a mild slightly white productive cough no hemoptysis. Otherwise is without complaint. Specifically denies hemoptysis or marked increase of shortness of breath or dyspnea on exertion. She had a CT scan. Two-week prior showing the lingular nodule less prominent then in September 2016 and right lower lobe nodule stable from September 2016. Patient is being followed by medical oncology for pancytopenia which has been fairly stable.  COMPLICATIONS OF TREATMENT: none  FOLLOW UP COMPLIANCE: keeps appointments   PHYSICAL EXAM:  BP 143/63 mmHg  Pulse 102  Temp(Src) 95.7 F (35.4 C)  Resp 18  Wt 148 lb 7.7 oz (67.35 kg) Well-developed well-nourished patient in NAD. HEENT reveals PERLA, EOMI, discs not visualized.  Oral cavity is clear. No oral mucosal lesions are identified. Neck is clear without evidence of cervical or supraclavicular adenopathy. Lungs are clear to A&P. Cardiac examination is essentially unremarkable with regular rate and rhythm without murmur rub or thrill. Abdomen is benign with no organomegaly or masses noted. Motor sensory and DTR levels are equal and symmetric in the upper and lower extremities. Cranial nerves II through XII are grossly intact. Proprioception is intact. No peripheral adenopathy or edema is identified. No motor or sensory levels are noted. Crude visual fields are within normal  range.  RADIOLOGY RESULTS:  serial CT scans are reviewed   PLAN: At the present time she is doing well. Her clinical situation is stable based on her CT scans. I have suggested some Mucinex for her cough. Otherwise she is doing well. I have asked to see her back in 6 months for follow-up. She continues close follow-up care with medical oncology.  I would like to take this opportunity for allowing me to participate in the care of your patient.Armstead Peaks., MD

## 2015-05-30 NOTE — Progress Notes (Signed)
[place]  [phone]) [phone] [phone]) [phone]  ID: Marie Lowe OB: Dec 31, 1940  MR#: [id number]  NAT#:[id number]  Patient Care Team: Marinda Elk, MD as PCP - General (Physician Assistant)  CHIEF COMPLAINT:  Chief Complaint  Patient presents with  . Lung Cancer    INTERVAL HISTORY: Patient returns to clinic today for routine evaluation and discussion of her CT scan results. She continues to feel well and remains asymptomatic. She has some increased fatigue secondary to her husband's recent stroke as well as being diagnosed with stage I Hodgkin's lymphoma. She has a good appetite and her weight is stable. She denies any fevers or night sweats. She has no neurologic complaints.  She denies any nausea, vomiting, constipation, or diarrhea. She has no chest pain, cough, or shortness of breath. She has no urinary complaints.  Patient offers no further specific complaints today.  REVIEW OF SYSTEMS:   Review of Systems  Constitutional: Negative.  Negative for fever and malaise/fatigue.  Respiratory: Negative for cough, hemoptysis and shortness of breath.   Cardiovascular: Negative.  Negative for chest pain.  Gastrointestinal: Negative.   Musculoskeletal: Negative.   Neurological: Negative.  Negative for weakness.  Endo/Heme/Allergies: Negative.     As per HPI. Otherwise, a complete review of systems is negatve.  PAST MEDICAL HISTORY: Past Medical History  Diagnosis Date  . Hypertension   . High cholesterol   . GERD (gastroesophageal reflux disease)   . COPD (chronic obstructive pulmonary disease) ([place])   . Shortness of breath dyspnea   . Anemia   . Hodgkin's lymphoma ([place])     chemo    PAST SURGICAL HISTORY: Past Surgical History  Procedure Laterality Date  . Abdominal hysterectomy    . Appendectomy    . Right lower lobe wedge resection      FAMILY HISTORY Family History  Problem Relation Age of Onset  . Diabetes Other   . CAD  Other   . Cancer Other     colon cancer, breast cancer  . Breast cancer Maternal Aunt        ADVANCED DIRECTIVES:    HEALTH MAINTENANCE: Social History  Substance Use Topics  . Smoking status: Former Smoker -- 1.00 packs/day for 18 years    Types: Cigarettes  . Smokeless tobacco: Never Used  . Alcohol Use: 0.6 oz/week    1 Glasses of wine per week     Comment: Occassionaly     Colonoscopy:  PAP:  Bone density:  Lipid panel:  Allergies  Allergen Reactions  . Hydrochlorothiazide Other (See Comments)    Elevation of creatinine Patient doesn't remember what reaction she had to it.    Current Outpatient Prescriptions  Medication Sig Dispense Refill  . albuterol (PROVENTIL HFA;VENTOLIN HFA) 108 (90 BASE) MCG/ACT inhaler Inhale 2 puffs into the lungs every 6 (six) hours as needed for wheezing or shortness of breath.    Marland Kitchen amLODipine-benazepril (LOTREL) 10-40 MG per capsule Take 1 capsule by mouth daily.  3  . atorvastatin (LIPITOR) 10 MG tablet     . carvedilol (COREG) 6.25 MG tablet Take 6.25 mg by mouth 2 (two) times daily.  5  . mirtazapine (REMERON) 15 MG tablet TAKE 1 TABLET (15 MG TOTAL) BY MOUTH NIGHTLY.  3  . ranitidine (ZANTAC) 150 MG tablet     . SPIRIVA HANDIHALER 18 MCG inhalation capsule      No current facility-administered medications for this visit.   Facility-Administered Medications Ordered in Other Visits  Medication  Dose Route Frequency Provider Last Rate Last Dose  . heparin lock flush 100 unit/mL  500 Units Intravenous Once Lloyd Huger, MD      . sodium chloride 0.9 % injection 10 mL  10 mL Intravenous PRN Lloyd Huger, MD   10 mL at 02/09/15 0844    OBJECTIVE: Filed Vitals:   05/18/15 1240  BP: 147/70  Pulse: 90  Temp: 97.1 F (36.2 C)  Resp: 16     Body mass index is 25.1 kg/(m^2).    ECOG FS:0 - Asymptomatic  General: Well-developed, well-nourished, no acute distress. Eyes: anicteric sclera. HEENT: No palpable  lymphadenopathy.  Lungs: Clear to auscultation bilaterally. Heart: Regular rate and rhythm. No rubs, murmurs, or gallops. Abdomen: Soft, nontender, nondistended. No organomegaly noted, normoactive bowel sounds. Musculoskeletal: No edema, cyanosis, or clubbing. Neuro: Alert, answering all questions appropriately. Cranial nerves grossly intact. Skin: No rashes or petechiae noted. Psych: Normal affect.   LAB RESULTS:  Lab Results  Component Value Date   NA 137 05/12/2015   K 4.2 05/12/2015   CL 105 05/12/2015   CO2 24 05/12/2015   GLUCOSE 88 05/12/2015   BUN 20 05/12/2015   CREATININE 1.04* 05/12/2015   CALCIUM 9.6 05/12/2015   PROT 7.4 02/09/2015   ALBUMIN 3.3* 02/09/2015   AST 51* 02/09/2015   ALT 39 02/09/2015   ALKPHOS 380* 02/09/2015   BILITOT 1.5* 02/09/2015   GFRNONAA 52* 05/12/2015   GFRAA 60* 05/12/2015    Lab Results  Component Value Date   WBC 2.1* 05/12/2015   NEUTROABS 0.2* 05/12/2015   HGB 11.4* 05/12/2015   HCT 34.6* 05/12/2015   MCV 91.7 05/12/2015   PLT 174 05/12/2015     STUDIES: Ct Chest W Contrast  05/13/2015  CLINICAL DATA:  Right-sided lung cancer, Hodgkin lymphoma, restaging. EXAM: CT CHEST WITH CONTRAST TECHNIQUE: Multidetector CT imaging of the chest was performed during intravenous contrast administration. CONTRAST:  72m OMNIPAQUE IOHEXOL 350 MG/ML SOLN COMPARISON:  02/09/2015. FINDINGS: Mediastinum/Nodes: Right IJ Port-A-Cath terminate at the SVC RA junction. Mediastinal lymph nodes are not enlarged by CT size criteria. Right hilar lymph node measures 11 mm, stable. No axillary adenopathy. Atherosclerotic calcification of the arterial vasculature, including coronary arteries. Pulmonary arteries are borderline enlarged, as is the heart. No pericardial effusion. Lungs/Pleura: Severe centrilobular emphysema with bullous changes at the apices, left greater than right. Right lower lobe nodule measures 1.3 x 1.4 cm (series 4, image 29), stable. Nodular  soft tissue adjacent to sutures in the subpleural right lower lobe measures 1.3 x 1.8 cm (Image 33), stable. Scattered pulmonary parenchymal scarring. Mucoid impaction in the right lower lobe (Image 40). Lingular nodule measures approximately 0.9 x 1.2 cm (series 4, image 30), previously 1.1 x 1.6 cm. There is surrounding peribronchovascular ground-glass, suggesting an infectious or inflammatory process. No pleural fluid. Airway is otherwise unremarkable. Upper abdomen: Visualized portions of the liver and adrenal glands are unremarkable. There are multiple low-attenuation lesions in the kidneys, measuring up to 2.8 cm on the left. Additional hyper attenuating or mixed attenuating lesions in the kidneys are seen bilaterally, measuring up to 1.9 cm on the right (series 3, image 63). Kidneys are incompletely imaged. Visualized portions of the spleen and pancreas are unremarkable. Small hiatal hernia with thickening of the distal esophageal wall, which can be seen with reflux. Musculoskeletal: No worrisome lytic or sclerotic lesions. Degenerative changes are seen in the spine. IMPRESSION: 1. Lingular nodule is slightly less prominent than on 02/09/2015, indicative of  treatment response. 2. Right lower lobe nodules are stable from 02/09/2015. 3. Mixed attenuation lesions in the kidneys cannot be characterized as simple cysts. Further evaluation with pre and post contrast MRI should be considered. Pre and post contrast CT could alternatively be performed, but would likely be of decreased accuracy given lesion size. Electronically Signed   By: Lorin Picket M.D.   On: 05/13/2015 13:08   Mm Digital Screening Bilateral  05/21/2015  CLINICAL DATA:  Screening. EXAM: DIGITAL SCREENING BILATERAL MAMMOGRAM WITH CAD COMPARISON:  Previous exam(s). ACR Breast Density Category d: The breast tissue is extremely dense, which lowers the sensitivity of mammography. FINDINGS: There are no findings suspicious for malignancy. Images  were processed with CAD. IMPRESSION: No mammographic evidence of malignancy. A result letter of this screening mammogram will be mailed directly to the patient. RECOMMENDATION: Screening mammogram in one year. (Code:SM-B-01Y) BI-RADS CATEGORY  1: Negative. Electronically Signed   By: Franki Cabot M.D.   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  PLAN:    1.  Hodgkin's lymphoma: No evidence of disease.  Previously noted pulmonary nodule consistent with lung cancer.  Continue to monitor routinely with CT scans. 2.  Lung cancer: CT scan results reviewed independently and reported as above. Patient completed XRT to new lung nodule which was biopsied and confirmed squamous cell carcinoma.   Case was previously discussed at cancer conference and a recommendation was to continue to monitor with periodic CT scans. Previously, patient refused any further surgery or chemotherapy. Return to clinic in 6 months with repeat imaging and further evaluation.  3.  Pancytopenia: Patient's WBC count, hemoglobin, and platelet counts are all decreased but essentially unchanged and stable. She is also asymptomatic. No intervention is needed at this time. This is possibly related to myelosuppression from previous chemotherapy from Hodgkin's. Continue to monitor.    Patient expressed understanding and was in agreement with this plan. She also understands that She can call clinic at any time with any questions, concerns, or complaints.   Lloyd Huger, MD   05/30/2015 2:23 PM

## 2015-07-08 ENCOUNTER — Inpatient Hospital Stay: Payer: Medicare Other | Attending: Oncology

## 2015-07-08 DIAGNOSIS — C3431 Malignant neoplasm of lower lobe, right bronchus or lung: Secondary | ICD-10-CM | POA: Insufficient documentation

## 2015-07-08 DIAGNOSIS — Z923 Personal history of irradiation: Secondary | ICD-10-CM | POA: Diagnosis not present

## 2015-07-08 DIAGNOSIS — Z9221 Personal history of antineoplastic chemotherapy: Secondary | ICD-10-CM | POA: Diagnosis not present

## 2015-07-08 DIAGNOSIS — Z452 Encounter for adjustment and management of vascular access device: Secondary | ICD-10-CM | POA: Insufficient documentation

## 2015-07-08 DIAGNOSIS — C8190 Hodgkin lymphoma, unspecified, unspecified site: Secondary | ICD-10-CM | POA: Insufficient documentation

## 2015-07-08 MED ORDER — SODIUM CHLORIDE 0.9% FLUSH
10.0000 mL | INTRAVENOUS | Status: DC | PRN
  Administered 2015-07-08: 10 mL via INTRAVENOUS
  Filled 2015-07-08: qty 10

## 2015-07-08 MED ORDER — HEPARIN SOD (PORK) LOCK FLUSH 100 UNIT/ML IV SOLN
500.0000 [IU] | Freq: Once | INTRAVENOUS | Status: AC
  Administered 2015-07-08: 500 [IU] via INTRAVENOUS

## 2015-07-09 ENCOUNTER — Inpatient Hospital Stay: Payer: Medicare Other

## 2015-08-19 ENCOUNTER — Inpatient Hospital Stay: Payer: Medicare Other | Attending: Oncology

## 2015-08-19 DIAGNOSIS — C3431 Malignant neoplasm of lower lobe, right bronchus or lung: Secondary | ICD-10-CM | POA: Diagnosis not present

## 2015-08-19 DIAGNOSIS — Z9221 Personal history of antineoplastic chemotherapy: Secondary | ICD-10-CM | POA: Insufficient documentation

## 2015-08-19 DIAGNOSIS — C8190 Hodgkin lymphoma, unspecified, unspecified site: Secondary | ICD-10-CM | POA: Insufficient documentation

## 2015-08-19 DIAGNOSIS — Z452 Encounter for adjustment and management of vascular access device: Secondary | ICD-10-CM | POA: Diagnosis not present

## 2015-08-19 DIAGNOSIS — Z95828 Presence of other vascular implants and grafts: Secondary | ICD-10-CM

## 2015-08-19 DIAGNOSIS — Z923 Personal history of irradiation: Secondary | ICD-10-CM | POA: Diagnosis not present

## 2015-08-19 MED ORDER — SODIUM CHLORIDE 0.9% FLUSH
10.0000 mL | INTRAVENOUS | Status: DC | PRN
  Administered 2015-08-19: 10 mL via INTRAVENOUS
  Filled 2015-08-19: qty 10

## 2015-08-19 MED ORDER — HEPARIN SOD (PORK) LOCK FLUSH 100 UNIT/ML IV SOLN
500.0000 [IU] | Freq: Once | INTRAVENOUS | Status: AC
  Administered 2015-08-19: 500 [IU] via INTRAVENOUS

## 2015-09-22 ENCOUNTER — Telehealth: Payer: Self-pay | Admitting: *Deleted

## 2015-09-22 NOTE — Telephone Encounter (Signed)
Per discussion with Dr. Grayland Ormond, prefers to have patient worked up by GI. Dawson Bills notified.

## 2015-09-22 NOTE — Telephone Encounter (Signed)
Call received from Dawson Bills, NP at [facility]. Kim recently saw patient to schedule her for colonoscopy. Patient seen by PCP with labs on 3/20, labs revealed abnormal LFT's, Bilirubin. Maudie Mercury is questioning if you would like to work up patient at this time for abnormal labs or if you would like for GI to pursue further work up for patient. Maudie Mercury is recommending CT scan, lab work to include tumor markers and Hepatitis C work up. Patient has previously scheduled appointment for lab/CT and follow up with you on 6/19. Would you like to move up scans and follow up with you or have GI pursue further testing?

## 2015-09-24 ENCOUNTER — Other Ambulatory Visit: Payer: Self-pay | Admitting: Nurse Practitioner

## 2015-09-24 ENCOUNTER — Other Ambulatory Visit: Payer: Self-pay | Admitting: Unknown Physician Specialty

## 2015-09-24 DIAGNOSIS — R7989 Other specified abnormal findings of blood chemistry: Secondary | ICD-10-CM

## 2015-09-24 DIAGNOSIS — R634 Abnormal weight loss: Secondary | ICD-10-CM

## 2015-09-24 DIAGNOSIS — R945 Abnormal results of liver function studies: Principal | ICD-10-CM

## 2015-09-25 ENCOUNTER — Ambulatory Visit
Admission: RE | Admit: 2015-09-25 | Discharge: 2015-09-25 | Disposition: A | Discharge status: Home / Self Care | Payer: Medicare Other | Source: Ambulatory Visit | Attending: Unknown Physician Specialty | Admitting: Unknown Physician Specialty

## 2015-09-25 DIAGNOSIS — R634 Abnormal weight loss: Secondary | ICD-10-CM | POA: Insufficient documentation

## 2015-09-25 DIAGNOSIS — N281 Cyst of kidney, acquired: Secondary | ICD-10-CM | POA: Diagnosis not present

## 2015-09-25 DIAGNOSIS — R945 Abnormal results of liver function studies: Secondary | ICD-10-CM | POA: Diagnosis present

## 2015-09-25 DIAGNOSIS — Z85118 Personal history of other malignant neoplasm of bronchus and lung: Secondary | ICD-10-CM | POA: Diagnosis not present

## 2015-09-25 DIAGNOSIS — R932 Abnormal findings on diagnostic imaging of liver and biliary tract: Secondary | ICD-10-CM | POA: Diagnosis not present

## 2015-09-25 DIAGNOSIS — R7989 Other specified abnormal findings of blood chemistry: Secondary | ICD-10-CM

## 2015-09-28 ENCOUNTER — Other Ambulatory Visit: Payer: Self-pay | Admitting: Nurse Practitioner

## 2015-09-28 DIAGNOSIS — Z85118 Personal history of other malignant neoplasm of bronchus and lung: Secondary | ICD-10-CM

## 2015-09-28 DIAGNOSIS — R9389 Abnormal findings on diagnostic imaging of other specified body structures: Secondary | ICD-10-CM

## 2015-09-28 DIAGNOSIS — N281 Cyst of kidney, acquired: Secondary | ICD-10-CM

## 2015-09-28 DIAGNOSIS — K760 Fatty (change of) liver, not elsewhere classified: Secondary | ICD-10-CM

## 2015-09-28 DIAGNOSIS — R630 Anorexia: Secondary | ICD-10-CM

## 2015-09-28 DIAGNOSIS — R634 Abnormal weight loss: Secondary | ICD-10-CM

## 2015-09-30 ENCOUNTER — Encounter: Payer: Self-pay | Admitting: *Deleted

## 2015-10-01 ENCOUNTER — Ambulatory Visit
Admission: RE | Admit: 2015-10-01 | Discharge: 2015-10-01 | Disposition: A | Discharge status: Home / Self Care | Payer: Medicare Other | Source: Ambulatory Visit | Attending: Unknown Physician Specialty | Admitting: Unknown Physician Specialty

## 2015-10-01 ENCOUNTER — Encounter
Admission: RE | Disposition: A | Discharge status: Home / Self Care | Payer: Self-pay | Source: Ambulatory Visit | Attending: Unknown Physician Specialty

## 2015-10-01 ENCOUNTER — Ambulatory Visit: Payer: Medicare Other | Admitting: Anesthesiology

## 2015-10-01 ENCOUNTER — Encounter: Payer: Self-pay | Admitting: *Deleted

## 2015-10-01 DIAGNOSIS — J449 Chronic obstructive pulmonary disease, unspecified: Secondary | ICD-10-CM | POA: Insufficient documentation

## 2015-10-01 DIAGNOSIS — D123 Benign neoplasm of transverse colon: Secondary | ICD-10-CM | POA: Diagnosis not present

## 2015-10-01 DIAGNOSIS — Z1211 Encounter for screening for malignant neoplasm of colon: Secondary | ICD-10-CM | POA: Insufficient documentation

## 2015-10-01 DIAGNOSIS — Z800 Family history of malignant neoplasm of digestive organs: Secondary | ICD-10-CM | POA: Diagnosis not present

## 2015-10-01 DIAGNOSIS — K5730 Diverticulosis of large intestine without perforation or abscess without bleeding: Secondary | ICD-10-CM | POA: Diagnosis not present

## 2015-10-01 DIAGNOSIS — Z85118 Personal history of other malignant neoplasm of bronchus and lung: Secondary | ICD-10-CM | POA: Insufficient documentation

## 2015-10-01 DIAGNOSIS — I10 Essential (primary) hypertension: Secondary | ICD-10-CM | POA: Diagnosis not present

## 2015-10-01 DIAGNOSIS — D120 Benign neoplasm of cecum: Secondary | ICD-10-CM | POA: Diagnosis not present

## 2015-10-01 DIAGNOSIS — Z8571 Personal history of Hodgkin lymphoma: Secondary | ICD-10-CM | POA: Insufficient documentation

## 2015-10-01 DIAGNOSIS — K640 First degree hemorrhoids: Secondary | ICD-10-CM | POA: Diagnosis not present

## 2015-10-01 DIAGNOSIS — Z87891 Personal history of nicotine dependence: Secondary | ICD-10-CM | POA: Insufficient documentation

## 2015-10-01 DIAGNOSIS — K219 Gastro-esophageal reflux disease without esophagitis: Secondary | ICD-10-CM | POA: Diagnosis not present

## 2015-10-01 DIAGNOSIS — D122 Benign neoplasm of ascending colon: Secondary | ICD-10-CM | POA: Insufficient documentation

## 2015-10-01 DIAGNOSIS — E7800 Pure hypercholesterolemia, unspecified: Secondary | ICD-10-CM | POA: Diagnosis not present

## 2015-10-01 HISTORY — DX: Pneumonia, unspecified organism: J18.9

## 2015-10-01 HISTORY — DX: Malignant neoplasm of unspecified part of unspecified bronchus or lung: C34.90

## 2015-10-01 HISTORY — DX: Chest pain, unspecified: R07.9

## 2015-10-01 HISTORY — DX: Epistaxis: R04.0

## 2015-10-01 HISTORY — DX: Other diseases of stomach and duodenum: K31.89

## 2015-10-01 HISTORY — DX: Hereditary hemorrhagic telangiectasia: I78.0

## 2015-10-01 HISTORY — PX: COLONOSCOPY WITH PROPOFOL: SHX5780

## 2015-10-01 HISTORY — DX: Hypercalcemia: E83.52

## 2015-10-01 LAB — CBC
HCT: 32.9 % — ABNORMAL LOW (ref 35.0–47.0)
HEMOGLOBIN: 11.3 g/dL — AB (ref 12.0–16.0)
MCH: 30.8 pg (ref 26.0–34.0)
MCHC: 34.2 g/dL (ref 32.0–36.0)
MCV: 90.1 fL (ref 80.0–100.0)
Platelets: 130 10*3/uL — ABNORMAL LOW (ref 150–440)
RBC: 3.66 MIL/uL — ABNORMAL LOW (ref 3.80–5.20)
RDW: 19 % — AB (ref 11.5–14.5)
WBC: 2 10*3/uL — ABNORMAL LOW (ref 3.6–11.0)

## 2015-10-01 SURGERY — COLONOSCOPY WITH PROPOFOL
Anesthesia: General

## 2015-10-01 MED ORDER — SODIUM CHLORIDE 0.9 % IV SOLN
INTRAVENOUS | Status: DC
  Administered 2015-10-01: 10:00:00 via INTRAVENOUS

## 2015-10-01 MED ORDER — SODIUM CHLORIDE 0.9 % IV SOLN: INTRAVENOUS | Status: DC

## 2015-10-01 MED ORDER — MIDAZOLAM HCL 2 MG/2ML IJ SOLN
INTRAMUSCULAR | Status: DC | PRN
  Administered 2015-10-01: 1 mg via INTRAVENOUS

## 2015-10-01 MED ORDER — PROPOFOL 500 MG/50ML IV EMUL
INTRAVENOUS | Status: DC | PRN
  Administered 2015-10-01: 100 ug/kg/min via INTRAVENOUS

## 2015-10-01 MED ORDER — FENTANYL CITRATE (PF) 100 MCG/2ML IJ SOLN
INTRAMUSCULAR | Status: DC | PRN
  Administered 2015-10-01: 50 ug via INTRAVENOUS

## 2015-10-01 NOTE — H&P (Signed)
Primary Care Physician:  Marinda Elk, MD Primary Gastroenterologist:  Dr. Vira Agar  Pre-Procedure History & Physical: HPI:  Marie Lowe is a 75 y.o. female is here for an colonoscopy.   Past Medical History  Diagnosis Date  . Hypertension   . High cholesterol   . GERD (gastroesophageal reflux disease)   . COPD (chronic obstructive pulmonary disease) ([place])   . Shortness of breath dyspnea   . Anemia   . Chest pain, unspecified   . Epistaxis   . Erosive gastropathy   . Hypercalcemia   . Hodgkin's lymphoma ([place])     chemo  . Hodgkin's lymphoma ([place])   . Lung cancer ([place])   . Pneumonia   . 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 Abdominal hysterectomy    . Appendectomy    . Right lower lobe wedge resection    . Nasal sinus surgery      Prior to Admission medications   Medication Sig Start Date End Date Taking? Authorizing Provider  amLODipine-benazepril (LOTREL) 10-40 MG per capsule Take 1 capsule by mouth daily. 11/01/14  Yes Historical Provider, MD  antiseptic oral rinse (BIOTENE) LIQD 15 mLs by Mouth Rinse route as needed for dry mouth.   Yes Historical Provider, MD  budesonide-formoterol (SYMBICORT) 160-4.5 MCG/ACT inhaler Inhale 2 puffs into the lungs every 12 (twelve) hours.   Yes Historical Provider, MD  calcium carbonate (TUMS - DOSED IN MG ELEMENTAL CALCIUM) 500 MG chewable tablet Chew 1 tablet by mouth 2 (two) times daily.   Yes Historical Provider, MD  carvedilol (COREG) 6.25 MG tablet Take 6.25 mg by mouth 2 (two) times daily. 11/02/14  Yes Historical Provider, MD  Cyanocobalamin (VITAMIN B-12 CR PO) Take by mouth.   Yes Historical Provider, MD  Multiple Vitamins-Minerals (MULTIVITAMIN PO) Take 1 tablet by mouth.   Yes Historical Provider, MD  triamcinolone cream (KENALOG) 0.1 % Apply 1 application topically 2 (two) times daily.   Yes Historical Provider, MD  albuterol (PROVENTIL HFA;VENTOLIN HFA) 108 (90  BASE) MCG/ACT inhaler Inhale 2 puffs into the lungs every 6 (six) hours as needed for wheezing or shortness of breath.    Historical Provider, MD  atorvastatin (LIPITOR) 10 MG tablet  10/23/14   Historical Provider, MD  mirtazapine (REMERON) 15 MG tablet TAKE 1 TABLET (15 MG TOTAL) BY MOUTH NIGHTLY. 10/30/14   Historical Provider, MD  ranitidine (ZANTAC) 150 MG tablet  10/23/14   Historical Provider, MD  SPIRIVA HANDIHALER 18 MCG inhalation capsule  11/09/14   Historical Provider, MD    Allergies as of 09/22/2015 - Review Complete 05/29/2015  Allergen Reaction Noted  . Hydrochlorothiazide Other (See Comments) 10/02/2014    Family History  Problem Relation Age of Onset  . Diabetes Other   . CAD Other   . Cancer Other     colon cancer, breast cancer  . Breast cancer Maternal Aunt     Social History   Social History  . Marital Status: Married    Spouse Name: N/A  . Number of Children: N/A  . Years of Education: N/A   Occupational History  . Not on file.   Social History Main Topics  . Smoking status: Former Smoker -- 1.00 packs/day for 18 years    Types: Cigarettes  . Smokeless tobacco: Never Used  . Alcohol Use: 0.6 oz/week    1 Glasses of wine per week     Comment: Occassionaly  .  Drug Use: No  . Sexual Activity: Not on file   Other Topics Concern  . Not on file   Social History Narrative    Review of Systems: See HPI, otherwise negative ROS  Physical Exam: BP 93/43 mmHg  Pulse 77  Temp(Src) 96.8 F (36 C) (Tympanic)  Resp 20  Ht '5\' 6"'$  (1.676 m)  Wt 67.132 kg (148 lb)  BMI 23.90 kg/m2  SpO2 100% General:   Alert,  pleasant and cooperative in NAD Head:  Normocephalic and atraumatic. Neck:  Supple; no masses or thyromegaly. Lungs:  Clear throughout to auscultation.    Heart:  Regular rate and rhythm. Abdomen:  Soft, nontender and nondistended. Normal bowel sounds, without guarding, and without rebound.   Neurologic:  Alert and  oriented x4;  grossly normal  neurologically.  Impression/Plan: Marie Lowe is here for an colonoscopy to be performed for FH colon cancer in mother  Risks, benefits, limitations, and alternatives regarding  colonoscopy have been reviewed with the patient.  Questions have been answered.  All parties agreeable.   Gaylyn Cheers, MD  10/01/2015, 11:50 AM

## 2015-10-01 NOTE — Anesthesia Procedure Notes (Signed)
Performed by: COOK-MARTIN, Chiyeko Ferre Pre-anesthesia Checklist: Patient identified, Emergency Drugs available, Suction available, Patient being monitored and Timeout performed Patient Re-evaluated:Patient Re-evaluated prior to inductionOxygen Delivery Method: Nasal cannula Preoxygenation: Pre-oxygenation with 100% oxygen Intubation Type: IV induction Placement Confirmation: positive ETCO2 and CO2 detector       

## 2015-10-01 NOTE — Op Note (Signed)
[facility] Patient Name: Marie Lowe Procedure Date: 10/01/2015 11:04 AM MRN: [id number] Account #: [id number] Date of Birth: [birth year] Admit Type: Outpatient Age: 75 Room: [facility] ENDO ROOM 1 Gender: Female Note Status: Finalized Procedure:            Colonoscopy Indications:          Screening in patient at increased risk: Family history                        of 1st-degree relative with colorectal cancer Providers:            Manya Silvas, MD Referring MD:         Precious Bard, MD (Referring MD) Medicines:            Propofol per Anesthesia Complications:        No immediate complications. Procedure:            Pre-Anesthesia Assessment:                       - After reviewing the risks and benefits, the patient                        was deemed in satisfactory condition to undergo the                        procedure.                       After obtaining informed consent, the colonoscope was                        passed under direct vision. Throughout the procedure,                        the patient's blood pressure, pulse, and oxygen                        saturations were monitored continuously. The                        Colonoscope was introduced through the anus and                        advanced to the the cecum, identified by appendiceal                        orifice and ileocecal valve. The colonoscopy was                        performed without difficulty. The patient tolerated the                        procedure well. The quality of the bowel preparation                        was excellent. Findings:      A diminutive polyp was found in the cecum. The polyp was sessile. The       polyp was removed with a jumbo cold forceps. Resection and retrieval       were complete.      Two  sessile polyps were found in the ascending colon. The polyps were       diminutive in size. These polyps were removed with a jumbo cold  forceps.       Resection and retrieval were complete.      A diminutive polyp was found in the splenic flexure. The polyp was       sessile. The polyp was removed with a jumbo cold forceps. Resection and       retrieval were complete.      Multiple small-mouthed diverticula were found in the sigmoid colon and       descending colon.      Internal hemorrhoids were found during endoscopy. The hemorrhoids were       small and Grade I (internal hemorrhoids that do not prolapse).      The exam was otherwise without abnormality.      Many small-mouthed diverticula were found in the descending colon and       transverse colon. Impression:           - One diminutive polyp in the cecum, removed with a                        jumbo cold forceps. Resected and retrieved.                       - Two diminutive polyps in the ascending colon, removed                        with a jumbo cold forceps. Resected and retrieved.                       - One diminutive polyp at the splenic flexure, removed                        with a jumbo cold forceps. Resected and retrieved.                       - Diverticulosis in the sigmoid colon and in the                        descending colon.                       - Internal hemorrhoids.                       - The examination was otherwise normal. Recommendation:       - Await pathology results. Manya Silvas, MD 10/01/2015 11:30:35 AM This report has been signed electronically. Number of Addenda: 0 Note Initiated On: 10/01/2015 11:04 AM Scope Withdrawal Time: 0 hours 10 minutes 23 seconds  Total Procedure Duration: 0 hours 16 minutes 8 seconds       [facility]

## 2015-10-01 NOTE — Transfer of Care (Signed)
Immediate Anesthesia Transfer of Care Note  Patient: Marie Lowe  Procedure(s) Performed: Procedure(s): COLONOSCOPY WITH PROPOFOL (N/A)  Patient Location: PACU  Anesthesia Type:General  Level of Consciousness: awake and sedated  Airway & Oxygen Therapy: Patient Spontanous Breathing and Patient connected to nasal cannula oxygen  Post-op Assessment: Report given to RN and Post -op Vital signs reviewed and stable  Post vital signs: Reviewed and stable  Last Vitals:  Filed Vitals:   10/01/15 0940 10/01/15 1131  BP: 140/70 93/43  Pulse: 82 77  Temp: 36.1 C 36 C  Resp: 16 20    Last Pain: There were no vitals filed for this visit.       Complications: No apparent anesthesia complications

## 2015-10-01 NOTE — Anesthesia Postprocedure Evaluation (Signed)
Anesthesia Post Note  Patient: Marie Lowe  Procedure(s) Performed: Procedure(s) (LRB): COLONOSCOPY WITH PROPOFOL (N/A)  Patient location during evaluation: PACU Anesthesia Type: General Level of consciousness: awake Pain management: pain level controlled Vital Signs Assessment: post-procedure vital signs reviewed and stable Respiratory status: spontaneous breathing Cardiovascular status: blood pressure returned to baseline Anesthetic complications: no    Last Vitals:  Filed Vitals:   10/01/15 1131 10/01/15 1150  BP: 93/43 116/57  Pulse: 77 79  Temp: 36 C   Resp: 20 22    Last Pain: There were no vitals filed for this visit.               VAN STAVEREN,Hailynn Slovacek

## 2015-10-01 NOTE — Anesthesia Preprocedure Evaluation (Signed)
Anesthesia Evaluation  Patient identified by MRN, date of birth, ID band Patient awake    Reviewed: Allergy & Precautions, NPO status , Patient's Chart, lab work & pertinent test results  Airway Mallampati: III       Dental  (+) Upper Dentures, Partial Lower   Pulmonary shortness of breath and with exertion, COPD, former smoker,     + decreased breath sounds      Cardiovascular Exercise Tolerance: Poor hypertension, Pt. on medications  Rhythm:Regular Rate:Normal     Neuro/Psych    GI/Hepatic Neg liver ROS, GERD  Medicated,  Endo/Other  negative endocrine ROS  Renal/GU Renal InsufficiencyRenal disease     Musculoskeletal negative musculoskeletal ROS (+)   Abdominal Normal abdominal exam  (+)   Peds  Hematology  (+) anemia ,   Anesthesia Other Findings   Reproductive/Obstetrics                             Anesthesia Physical Anesthesia Plan  ASA: III  Anesthesia Plan: General   Post-op Pain Management:    Induction: Intravenous  Airway Management Planned: Natural Airway and Nasal Cannula  Additional Equipment:   Intra-op Plan:   Post-operative Plan:   Informed Consent: I have reviewed the patients History and Physical, chart, labs and discussed the procedure including the risks, benefits and alternatives for the proposed anesthesia with the patient or authorized representative who has indicated his/her understanding and acceptance.     Plan Discussed with: CRNA  Anesthesia Plan Comments:         Anesthesia Quick Evaluation

## 2015-10-02 ENCOUNTER — Encounter: Payer: Self-pay | Admitting: Unknown Physician Specialty

## 2015-10-02 LAB — SURGICAL PATHOLOGY

## 2015-10-07 ENCOUNTER — Ambulatory Visit
Admission: RE | Admit: 2015-10-07 | Discharge: 2015-10-07 | Disposition: A | Discharge status: Home / Self Care | Payer: Medicare Other | Source: Ambulatory Visit | Attending: Nurse Practitioner | Admitting: Nurse Practitioner

## 2015-10-07 DIAGNOSIS — Z85118 Personal history of other malignant neoplasm of bronchus and lung: Secondary | ICD-10-CM | POA: Insufficient documentation

## 2015-10-07 DIAGNOSIS — N281 Cyst of kidney, acquired: Secondary | ICD-10-CM | POA: Insufficient documentation

## 2015-10-07 DIAGNOSIS — K760 Fatty (change of) liver, not elsewhere classified: Secondary | ICD-10-CM | POA: Diagnosis not present

## 2015-10-07 DIAGNOSIS — Z9889 Other specified postprocedural states: Secondary | ICD-10-CM | POA: Insufficient documentation

## 2015-10-07 DIAGNOSIS — R634 Abnormal weight loss: Secondary | ICD-10-CM | POA: Diagnosis not present

## 2015-10-07 DIAGNOSIS — J439 Emphysema, unspecified: Secondary | ICD-10-CM | POA: Diagnosis not present

## 2015-10-07 DIAGNOSIS — I700 Atherosclerosis of aorta: Secondary | ICD-10-CM | POA: Diagnosis not present

## 2015-10-07 DIAGNOSIS — R938 Abnormal findings on diagnostic imaging of other specified body structures: Secondary | ICD-10-CM | POA: Insufficient documentation

## 2015-10-07 DIAGNOSIS — R9389 Abnormal findings on diagnostic imaging of other specified body structures: Secondary | ICD-10-CM

## 2015-10-07 DIAGNOSIS — R630 Anorexia: Secondary | ICD-10-CM

## 2015-10-07 MED ORDER — IOPAMIDOL (ISOVUE-300) INJECTION 61%
100.0000 mL | Freq: Once | INTRAVENOUS | Status: AC | PRN
  Administered 2015-10-07: 100 mL via INTRAVENOUS

## 2015-11-16 ENCOUNTER — Inpatient Hospital Stay: Payer: Medicare Other | Attending: Oncology

## 2015-11-16 ENCOUNTER — Ambulatory Visit
Admission: RE | Admit: 2015-11-16 | Discharge: 2015-11-16 | Disposition: A | Discharge status: Home / Self Care | Payer: Medicare Other | Source: Ambulatory Visit | Attending: Oncology | Admitting: Oncology

## 2015-11-16 DIAGNOSIS — J700 Acute pulmonary manifestations due to radiation: Secondary | ICD-10-CM | POA: Insufficient documentation

## 2015-11-16 DIAGNOSIS — I2510 Atherosclerotic heart disease of native coronary artery without angina pectoris: Secondary | ICD-10-CM | POA: Diagnosis not present

## 2015-11-16 DIAGNOSIS — J449 Chronic obstructive pulmonary disease, unspecified: Secondary | ICD-10-CM | POA: Insufficient documentation

## 2015-11-16 DIAGNOSIS — Z87891 Personal history of nicotine dependence: Secondary | ICD-10-CM | POA: Insufficient documentation

## 2015-11-16 DIAGNOSIS — C3412 Malignant neoplasm of upper lobe, left bronchus or lung: Secondary | ICD-10-CM | POA: Insufficient documentation

## 2015-11-16 DIAGNOSIS — C3431 Malignant neoplasm of lower lobe, right bronchus or lung: Secondary | ICD-10-CM | POA: Diagnosis not present

## 2015-11-16 DIAGNOSIS — I10 Essential (primary) hypertension: Secondary | ICD-10-CM | POA: Diagnosis not present

## 2015-11-16 DIAGNOSIS — D61818 Other pancytopenia: Secondary | ICD-10-CM | POA: Insufficient documentation

## 2015-11-16 DIAGNOSIS — Z8571 Personal history of Hodgkin lymphoma: Secondary | ICD-10-CM | POA: Diagnosis not present

## 2015-11-16 DIAGNOSIS — R911 Solitary pulmonary nodule: Secondary | ICD-10-CM | POA: Insufficient documentation

## 2015-11-16 DIAGNOSIS — K5900 Constipation, unspecified: Secondary | ICD-10-CM | POA: Diagnosis not present

## 2015-11-16 DIAGNOSIS — J432 Centrilobular emphysema: Secondary | ICD-10-CM | POA: Insufficient documentation

## 2015-11-16 DIAGNOSIS — Z923 Personal history of irradiation: Secondary | ICD-10-CM | POA: Diagnosis not present

## 2015-11-16 DIAGNOSIS — Y842 Radiological procedure and radiotherapy as the cause of abnormal reaction of the patient, or of later complication, without mention of misadventure at the time of the procedure: Secondary | ICD-10-CM | POA: Insufficient documentation

## 2015-11-16 DIAGNOSIS — Z79899 Other long term (current) drug therapy: Secondary | ICD-10-CM | POA: Diagnosis not present

## 2015-11-16 DIAGNOSIS — E7800 Pure hypercholesterolemia, unspecified: Secondary | ICD-10-CM | POA: Insufficient documentation

## 2015-11-16 DIAGNOSIS — K219 Gastro-esophageal reflux disease without esophagitis: Secondary | ICD-10-CM | POA: Insufficient documentation

## 2015-11-16 DIAGNOSIS — R918 Other nonspecific abnormal finding of lung field: Secondary | ICD-10-CM | POA: Diagnosis not present

## 2015-11-16 LAB — BASIC METABOLIC PANEL
Anion gap: 5 (ref 5–15)
BUN: 23 mg/dL — ABNORMAL HIGH (ref 6–20)
CALCIUM: 10.8 mg/dL — AB (ref 8.9–10.3)
CHLORIDE: 105 mmol/L (ref 101–111)
CO2: 28 mmol/L (ref 22–32)
Creatinine, Ser: 1.29 mg/dL — ABNORMAL HIGH (ref 0.44–1.00)
GFR, EST AFRICAN AMERICAN: 46 mL/min — AB (ref >60)
GFR, EST NON AFRICAN AMERICAN: 39 mL/min — AB (ref >60)
GLUCOSE: 86 mg/dL (ref 65–99)
Potassium: 3.9 mmol/L (ref 3.5–5.1)
Sodium: 138 mmol/L (ref 135–145)

## 2015-11-16 LAB — CBC WITH DIFFERENTIAL/PLATELET
BASOS ABS: 0 10*3/uL (ref 0–0.1)
Basophils Relative: 1 %
Eosinophils Absolute: 0 10*3/uL (ref 0–0.7)
Eosinophils Relative: 1 %
HEMATOCRIT: 35.7 % (ref 35.0–47.0)
HEMOGLOBIN: 12.1 g/dL (ref 12.0–16.0)
LYMPHS ABS: 0.9 10*3/uL — AB (ref 1.0–3.6)
LYMPHS PCT: 42 %
MCH: 30.9 pg (ref 26.0–34.0)
MCHC: 34.1 g/dL (ref 32.0–36.0)
MCV: 90.7 fL (ref 80.0–100.0)
Monocytes Absolute: 0.7 10*3/uL (ref 0.2–0.9)
Monocytes Relative: 34 %
NEUTROS ABS: 0.5 10*3/uL — AB (ref 1.4–6.5)
Neutrophils Relative %: 24 %
PLATELETS: 129 10*3/uL — AB (ref 150–440)
RBC: 3.93 MIL/uL (ref 3.80–5.20)
RDW: 18.2 % — ABNORMAL HIGH (ref 11.5–14.5)
WBC: 2.1 10*3/uL — AB (ref 3.6–11.0)

## 2015-11-16 MED ORDER — IOPAMIDOL (ISOVUE-300) INJECTION 61%
60.0000 mL | Freq: Once | INTRAVENOUS | Status: AC | PRN
  Administered 2015-11-16: 60 mL via INTRAVENOUS

## 2015-11-18 ENCOUNTER — Encounter: Payer: Self-pay | Admitting: Urology

## 2015-11-18 ENCOUNTER — Ambulatory Visit (INDEPENDENT_AMBULATORY_CARE_PROVIDER_SITE_OTHER): Payer: Medicare Other | Admitting: Urology

## 2015-11-18 VITALS — BP 121/72 | HR 89 | Ht 66.0 in | Wt 146.3 lb

## 2015-11-18 DIAGNOSIS — N183 Chronic kidney disease, stage 3 (moderate): Secondary | ICD-10-CM | POA: Diagnosis not present

## 2015-11-18 DIAGNOSIS — Q6100 Congenital renal cyst, unspecified: Secondary | ICD-10-CM

## 2015-11-18 DIAGNOSIS — N281 Cyst of kidney, acquired: Secondary | ICD-10-CM

## 2015-11-18 NOTE — Progress Notes (Signed)
11/18/2015 10:29 AM   Marie Lowe [birth year] [id number]  Referring provider: Marinda Elk, MD [facility] [place], [place] [address]  Chief Complaint  Patient presents with  . New Patient (Initial Visit)    HPI:  75 yo F with history of Hodkin's lymphoma and lung cancer who presents today for Further evaluation of bilateral renal cysts.  Most recently, she was being followed by-neurology for workup of elevated LFTs.  She underwent an abdominal ultrasound at which time a fatty infiltrate was noted within the liver. A follow-up CT abdomen/ pelvis with contrast was recommended.  On CT abdomen and pelvis with contrast on 10/07/2015, numerous bilateral cysts were identified measuring up to 2.2 cm on the interpolar right kidney.  In addition, there is a more complex left lower pole lesion which has been stable in size since 03/2013. A few of these are more complex likely representing proteinaceous cyst with debris or hemorrhage. There is no obvious solid-appearing or enhancing tumors.   She does have baseline renal dysfunction dating back for at least 5 years or more. Baseline creatinine around 1.3 and has remained stable.    She denies any urinary complaints. No flank pain, no gross hematuria. No urinary tract infections. No history of kidney stones.    PMH: Past Medical History  Diagnosis Date  . Hypertension   . High cholesterol   . GERD (gastroesophageal reflux disease)   . COPD (chronic obstructive pulmonary disease) ([place])   . Shortness of breath dyspnea   . Anemia   . Chest pain, unspecified   . Epistaxis   . Erosive gastropathy   . Hypercalcemia   . Hodgkin's lymphoma ([place])     chemo  . Hodgkin's lymphoma ([place])   . Lung cancer ([place])   . Pneumonia   . 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 Abdominal hysterectomy    . Appendectomy    . Right lower lobe wedge  resection    . Nasal sinus surgery    . Colonoscopy with propofol N/A 10/01/2015    Procedure: COLONOSCOPY WITH PROPOFOL;  Surgeon: Manya Silvas, MD;  Location: [facility] ENDOSCOPY;  Service: Endoscopy;  Laterality: N/A;    Home Medications:    Medication List       This list is accurate as of: 11/18/15 11:59 PM.  Always use your most recent med list.               albuterol 108 (90 Base) MCG/ACT inhaler  Commonly known as:  PROVENTIL HFA;VENTOLIN HFA  Inhale 2 puffs into the lungs every 6 (six) hours as needed for wheezing or shortness of breath.     amLODipine-benazepril 10-40 MG capsule  Commonly known as:  LOTREL  Take 1 capsule by mouth daily.     antiseptic oral rinse Liqd  15 mLs by Mouth Rinse route as needed for dry mouth.     atorvastatin 10 MG tablet  Commonly known as:  LIPITOR     calcium carbonate 1250 (500 Ca) MG tablet  Commonly known as:  OS-CAL - dosed in mg of elemental calcium  Take by mouth.     calcium carbonate 500 MG chewable tablet  Commonly known as:  TUMS - dosed in mg elemental calcium  Chew 1 tablet by mouth 2 (two) times daily.     carvedilol 6.25 MG tablet  Commonly known as:  COREG  Take  6.25 mg by mouth 2 (two) times daily.     MULTIVITAMIN PO  Take 1 tablet by mouth.     ranitidine 150 MG tablet  Commonly known as:  ZANTAC     SPIRIVA HANDIHALER 18 MCG inhalation capsule  Generic drug:  tiotropium     triamcinolone cream 0.1 %  Commonly known as:  KENALOG  Apply 1 application topically 2 (two) times daily.     VITAMIN B-12 CR PO  Take by mouth.        Allergies:  Allergies  Allergen Reactions  . Hydrochlorothiazide Other (See Comments)    Elevation of creatinine Patient doesn't remember what reaction she had to it.    Family History: Family History  Problem Relation Age of Onset  . Diabetes Other   . CAD Other   . Cancer Other     colon cancer, breast cancer  . Kidney disease Other   . Breast cancer Maternal  Aunt   . Prostate cancer Father   . Kidney cancer Neg Hx     Social History:  reports that she has quit smoking. Her smoking use included Cigarettes. She has a 18 pack-year smoking history. She has never used smokeless tobacco. She reports that she drinks about 0.6 oz of alcohol per week. She reports that she does not use illicit drugs.  ROS: UROLOGY Frequent Urination?: No Hard to postpone urination?: No Burning/pain with urination?: No Get up at night to urinate?: Yes Leakage of urine?: No Urine stream starts and stops?: No Trouble starting stream?: No Do you have to strain to urinate?: No Blood in urine?: No Urinary tract infection?: No Sexually transmitted disease?: No Injury to kidneys or bladder?: No Painful intercourse?: No Weak stream?: Yes Currently pregnant?: No Vaginal bleeding?: No Last menstrual period?: n  Gastrointestinal Nausea?: No Vomiting?: No Indigestion/heartburn?: No Diarrhea?: No Constipation?: Yes  Constitutional Fever: No Night sweats?: No Weight loss?: No Fatigue?: No  Skin Skin rash/lesions?: No Itching?: No  Eyes Blurred vision?: Yes Double vision?: No  Ears/Nose/Throat Sore throat?: No Sinus problems?: Yes  Hematologic/Lymphatic Swollen glands?: No Easy bruising?: No  Cardiovascular Leg swelling?: No Chest pain?: No  Respiratory Cough?: Yes Shortness of breath?: Yes  Endocrine Excessive thirst?: No  Musculoskeletal Back pain?: No Joint pain?: No  Neurological Headaches?: No Dizziness?: No  Psychologic Depression?: No Anxiety?: No  Physical Exam: BP 121/72 mmHg  Pulse 89  Ht '5\' 6"'$  (1.676 m)  Wt 146 lb 4.8 oz (66.361 kg)  BMI 23.62 kg/m2  Constitutional:  Alert and oriented, No acute distress.  Accompanied today by her husband. HEENT: [place] AT, moist mucus membranes.  Trachea midline, no masses. Cardiovascular: No clubbing, cyanosis, or edema. Respiratory: Normal respiratory effort, no increased work of  breathing. GI: Abdomen is soft, nontender, nondistended, no abdominal masses GU: No CVA tenderness.  Skin: No rashes, bruises or suspicious lesions. Lymph: No cervical  adenopathy. Neurologic: Grossly intact, no focal deficits, moving all 4 extremities. Psychiatric: Normal mood and affect.  Laboratory Data: Lab Results  Component Value Date   WBC 2.1* 11/16/2015   HGB 12.1 11/16/2015   HCT 35.7 11/16/2015   MCV 90.7 11/16/2015   PLT 129* 11/16/2015    Lab Results  Component Value Date   CREATININE 1.29* 11/16/2015     Pertinent Imaging: Study Result     CLINICAL DATA: Family history of colon cancer with several diminutive polyps removed on recent colonoscopy. Weight loss. Personal history of Hodgkin's lymphoma and lung cancer.  Previous hysterectomy and appendectomy.  EXAM: CT ABDOMEN AND PELVIS WITH CONTRAST  TECHNIQUE: Multidetector CT imaging of the abdomen and pelvis was performed using the standard protocol following bolus administration of intravenous contrast.  CONTRAST: 110m ISOVUE-300 IOPAMIDOL (ISOVUE-300) INJECTION 61%  COMPARISON: Chest abdomen and pelvis CT from 09/19/2014 and 08/07/2013.  FINDINGS: Lower chest: Emphysema noted in the lung bases. Postsurgical change seen in the posterior right lower lobe. 10 mm peripheral nodule in the posterior right lower lobe, just medial to the suture line, is stable in the interval. The previous study also showed an 11 mm nodule along the cranial margin of the suture and a spiculated more central nodule in the upper portion of the right lower lobe. Neither of these nodules is seen on today's study because that portion of the lung was not included on this abdomen and pelvis exam.  Hepatobiliary: No focal abnormality within the liver parenchyma. There is no evidence for gallstones, gallbladder wall thickening, or pericholecystic fluid. No intrahepatic or extrahepatic biliary dilation.  Pancreas:  No focal mass lesion. No dilatation of the main duct. No intraparenchymal cyst. No peripancreatic edema.  Spleen: No splenomegaly. No focal mass lesion.  Adrenals/Urinary Tract: No adrenal nodule or mass. Multiple cystic lesions are seen in the kidneys bilaterally. One dominant lesion in the right kidney (image 24 series 2) is a 2.2 cm relatively homogeneous lesion within average attenuation too high to represent a simple cyst. This lesion has not progressed as it measured 2.1 cm previously. Imaging features suggest that this may be a cyst complicated by proteinaceous debris or hemorrhage. 15 mm lesion in the lower pole of the left kidney is heterogeneous with a somewhat nodular 11 mm component that was 10 mm on the previous study. The nodular component of this lesion measured 12 mm on the study from 08/07/2013. Neither of these lesions appeared hypermetabolic on PET-CT from 126/94/8546  Stomach/Bowel: Small hiatal hernia noted. Apparent wall thickening in the gastric cardia is presumably related to underdistention with similar imaging features on prior studies of 09/19/2014 and 08/07/2013. Duodenum is normally positioned as is the ligament of Treitz. No small bowel wall thickening. No small bowel dilatation. The terminal ileum is normal. Nonvisualization of the appendix is consistent with the reported history of appendectomy. Diverticular changes are noted in the left colon without evidence of diverticulitis.  Vascular/Lymphatic: There is abdominal aortic atherosclerosis without aneurysm. There is no gastrohepatic or hepatoduodenal ligament lymphadenopathy. No intraperitoneal or retroperitoneal lymphadenopathy. No pelvic sidewall lymphadenopathy.  Reproductive: Uterus is surgically absent. There is no adnexal mass.  Other: No intraperitoneal free fluid.  Musculoskeletal: Bone windows reveal no worrisome lytic or sclerotic osseous lesions.  IMPRESSION: 1. Stable  exam. No new or progressive findings. 2. Emphysema With postsurgical change in the posterior right lower lobe. Visualized nodular lesion adjacent to the right lower lobe suture line is stable in the interval. Other nodular areas seen on previous imaging cannot be compared today's they were not included on this abdomen and pelvis exam. 3. Bilateral renal cysts, some of which are non simple. 2.2 cm lesion in the interpolar right kidney is unchanged since 08/07/2013 and is probably a cyst complicated by proteinaceous debris or hemorrhage. More heterogeneous lesion in the lower pole the left kidney has also been stable and showed no hypermetabolism on 04/03/2013. Continued attention to these lesions on follow-up is recommended. 4. Stable appearance of mild wall thickening in the region of the gastric cardia. 5. Abdominal aortic atherosclerosis. 6.   Electronically  Signed  By: Misty Stanley M.D.  On: 10/07/2015 10:25     Assessment & Plan:    1. Renal cyst Multiple bilateral cysts with varying complexity stable since at least 2014  For cystic renal masses, we reviewed the Bosniak classification and discussed that Bosniak 3 lesions harbor a 50% chance of malignancy whereas Bosniak 4 cysts have a solid and 90-95% are malignant in nature.   These lesions are consistent with Bosniak I, II, and IIF.    At this point, I have recommended no further intervention rather continued surveillance. Plan for repeat renal ultrasound in one year given  stability over time.  She is agreeable with this plan.     2. CKD (chronic kidney disease), stage 3 (moderate) Stable renal fx for ~5 years Not followed by nephrology but Cr stable Reviewed renal precuations   Return in about 1 year (around 11/17/2016) for f/u RUS.  Hollice Espy, MD  [facility] [address] [place],  [address] [phone]

## 2015-11-19 ENCOUNTER — Inpatient Hospital Stay (HOSPITAL_BASED_OUTPATIENT_CLINIC_OR_DEPARTMENT_OTHER): Payer: Medicare Other | Admitting: Oncology

## 2015-11-19 VITALS — BP 132/67 | HR 97 | Temp 98.3°F | Resp 18 | Wt 147.7 lb

## 2015-11-19 DIAGNOSIS — J449 Chronic obstructive pulmonary disease, unspecified: Secondary | ICD-10-CM

## 2015-11-19 DIAGNOSIS — Z923 Personal history of irradiation: Secondary | ICD-10-CM | POA: Diagnosis not present

## 2015-11-19 DIAGNOSIS — D61818 Other pancytopenia: Secondary | ICD-10-CM

## 2015-11-19 DIAGNOSIS — I10 Essential (primary) hypertension: Secondary | ICD-10-CM

## 2015-11-19 DIAGNOSIS — C3412 Malignant neoplasm of upper lobe, left bronchus or lung: Secondary | ICD-10-CM | POA: Diagnosis not present

## 2015-11-19 DIAGNOSIS — J700 Acute pulmonary manifestations due to radiation: Secondary | ICD-10-CM

## 2015-11-19 DIAGNOSIS — C3431 Malignant neoplasm of lower lobe, right bronchus or lung: Secondary | ICD-10-CM

## 2015-11-19 DIAGNOSIS — Z87891 Personal history of nicotine dependence: Secondary | ICD-10-CM

## 2015-11-19 DIAGNOSIS — Z79899 Other long term (current) drug therapy: Secondary | ICD-10-CM

## 2015-11-19 DIAGNOSIS — Y842 Radiological procedure and radiotherapy as the cause of abnormal reaction of the patient, or of later complication, without mention of misadventure at the time of the procedure: Secondary | ICD-10-CM

## 2015-11-19 DIAGNOSIS — R911 Solitary pulmonary nodule: Secondary | ICD-10-CM

## 2015-11-19 DIAGNOSIS — Z8571 Personal history of Hodgkin lymphoma: Secondary | ICD-10-CM

## 2015-11-19 DIAGNOSIS — K5900 Constipation, unspecified: Secondary | ICD-10-CM

## 2015-11-19 NOTE — Progress Notes (Signed)
Patient is here for follow up, does mention she has stool softener and does has constipation. Mentions she has some memory loss, she has a hard time remembering things since her treatments.

## 2015-11-29 NOTE — Progress Notes (Signed)
[place]  [phone] [phone]) [phone]  ID: Marie Lowe OB: August 16, 1940  MR#: [id number]  NGE#:[id number]  Patient Care Team: Marinda Elk, MD as PCP - General (Physician Assistant)  CHIEF COMPLAINT:  Chief Complaint  Patient presents with  . Lung Cancer    INTERVAL HISTORY: Patient returns to clinic today for routine evaluation and discussion of her CT scan results. She continues to feel well and remains asymptomatic. She has occasional constipation. She has a good appetite and her weight is stable. She denies any fevers or night sweats. She has no neurologic complaints.  She denies any nausea, vomiting, or diarrhea. She has no chest pain, cough, or shortness of breath. She has no urinary complaints.  Patient offers no further specific complaints today.  REVIEW OF SYSTEMS:   Review of Systems  Constitutional: Negative.  Negative for fever and malaise/fatigue.  Respiratory: Negative for cough, hemoptysis and shortness of breath.   Cardiovascular: Negative.  Negative for chest pain.  Gastrointestinal: Positive for constipation.  Musculoskeletal: Negative.   Neurological: Negative.  Negative for weakness.  Endo/Heme/Allergies: Negative.   Psychiatric/Behavioral: Positive for memory loss.    As per HPI. Otherwise, a complete review of systems is negatve.  PAST MEDICAL HISTORY: Past Medical History  Diagnosis Date  . Hypertension   . High cholesterol   . GERD (gastroesophageal reflux disease)   . COPD (chronic obstructive pulmonary disease) ([place])   . Shortness of breath dyspnea   . Anemia   . Chest pain, unspecified   . Epistaxis   . Erosive gastropathy   . Hypercalcemia   . Hodgkin's lymphoma ([place])     chemo  . Hodgkin's lymphoma ([place])   . Lung cancer ([place])   . Pneumonia   . 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 Abdominal hysterectomy    .  Appendectomy    . Right lower lobe wedge resection    . Nasal sinus surgery    . Colonoscopy with propofol N/A 10/01/2015    Procedure: COLONOSCOPY WITH PROPOFOL;  Surgeon: Manya Silvas, MD;  Location: [facility] ENDOSCOPY;  Service: Endoscopy;  Laterality: N/A;    FAMILY HISTORY Family History  Problem Relation Age of Onset  . Diabetes Other   . CAD Other   . Cancer Other     colon cancer, breast cancer  . Kidney disease Other   . Breast cancer Maternal Aunt   . Prostate cancer Father   . Kidney cancer Neg Hx        ADVANCED DIRECTIVES:    HEALTH MAINTENANCE: Social History  Substance Use Topics  . Smoking status: Former Smoker -- 1.00 packs/day for 18 years    Types: Cigarettes  . Smokeless tobacco: Never Used  . Alcohol Use: 0.6 oz/week    1 Glasses of wine per week     Comment: Occassionaly     Colonoscopy:  PAP:  Bone density:  Lipid panel:  Allergies  Allergen Reactions  . Hydrochlorothiazide Other (See Comments)    Elevation of creatinine Patient doesn't remember what reaction she had to it.    Current Outpatient Prescriptions  Medication Sig Dispense Refill  . albuterol (PROVENTIL HFA;VENTOLIN HFA) 108 (90 BASE) MCG/ACT inhaler Inhale 2 puffs into the lungs every 6 (six) hours as needed for wheezing or shortness of breath.    Marland Kitchen amLODipine-benazepril (LOTREL) 10-40 MG per capsule Take 1 capsule by mouth daily.  3  .  antiseptic oral rinse (BIOTENE) LIQD 15 mLs by Mouth Rinse route as needed for dry mouth.    Marland Kitchen atorvastatin (LIPITOR) 10 MG tablet     . calcium carbonate (OS-CAL - DOSED IN MG OF ELEMENTAL CALCIUM) 1250 (500 Ca) MG tablet Take by mouth.    . calcium carbonate (TUMS - DOSED IN MG ELEMENTAL CALCIUM) 500 MG chewable tablet Chew 1 tablet by mouth 2 (two) times daily.    . carvedilol (COREG) 6.25 MG tablet Take 6.25 mg by mouth 2 (two) times daily.  5  . Cyanocobalamin (VITAMIN B-12 CR PO) Take by mouth.    . Multiple Vitamins-Minerals (MULTIVITAMIN  PO) Take 1 tablet by mouth.    . ranitidine (ZANTAC) 150 MG tablet     . SPIRIVA HANDIHALER 18 MCG inhalation capsule     . triamcinolone cream (KENALOG) 0.1 % Apply 1 application topically 2 (two) times daily.     No current facility-administered medications for this visit.   Facility-Administered Medications Ordered in Other Visits  Medication Dose Route Frequency Provider Last Rate Last Dose  . heparin lock flush 100 unit/mL  500 Units Intravenous Once Lloyd Huger, MD      . sodium chloride 0.9 % injection 10 mL  10 mL Intravenous PRN Lloyd Huger, MD   10 mL at 02/09/15 0844    OBJECTIVE: Filed Vitals:   11/19/15 1056  BP: 132/67  Pulse: 97  Temp: 98.3 F (36.8 C)  Resp: 18     Body mass index is 23.85 kg/(m^2).    ECOG FS:0 - Asymptomatic  General: Well-developed, well-nourished, no acute distress. Eyes: anicteric sclera. HEENT: No palpable lymphadenopathy.  Lungs: Clear to auscultation bilaterally. Heart: Regular rate and rhythm. No rubs, murmurs, or gallops. Abdomen: Soft, nontender, nondistended. No organomegaly noted, normoactive bowel sounds. Musculoskeletal: No edema, cyanosis, or clubbing. Neuro: Alert, answering all questions appropriately. Cranial nerves grossly intact. Skin: No rashes or petechiae noted. Psych: Normal affect.   LAB RESULTS:  Lab Results  Component Value Date   NA 138 11/16/2015   K 3.9 11/16/2015   CL 105 11/16/2015   CO2 28 11/16/2015   GLUCOSE 86 11/16/2015   BUN 23* 11/16/2015   CREATININE 1.29* 11/16/2015   CALCIUM 10.8* 11/16/2015   PROT 7.4 02/09/2015   ALBUMIN 3.3* 02/09/2015   AST 51* 02/09/2015   ALT 39 02/09/2015   ALKPHOS 380* 02/09/2015   BILITOT 1.5* 02/09/2015   GFRNONAA 39* 11/16/2015   GFRAA 46* 11/16/2015    Lab Results  Component Value Date   WBC 2.1* 11/16/2015   NEUTROABS 0.5* 11/16/2015   HGB 12.1 11/16/2015   HCT 35.7 11/16/2015   MCV 90.7 11/16/2015   PLT 129* 11/16/2015      STUDIES: Ct Chest W Contrast  11/16/2015  CLINICAL DATA:  75 year old female with history of lung cancer originally diagnosed in 2014 treated with chemotherapy and radiation therapy. Follow-up study. EXAM: CT CHEST WITH CONTRAST TECHNIQUE: Multidetector CT imaging of the chest was performed during intravenous contrast administration. CONTRAST:  41m ISOVUE-300 IOPAMIDOL (ISOVUE-300) INJECTION 61% COMPARISON:  Chest CT 05/13/2015. FINDINGS: Mediastinum/Lymph Nodes: Heart size is borderline enlarged. There is no significant pericardial fluid, thickening or pericardial calcification. There is atherosclerosis of the thoracic aorta, the great vessels of the mediastinum and the coronary arteries, including calcified atherosclerotic plaque in the left anterior descending coronary artery. Mildly enlarged right hilar lymph node measuring 12 mm in short axis increased compared to the prior study. Borderline enlarged subcarinal  lymph node measuring 9 mm in short axis also similar to the prior examination. Esophagus is unremarkable in appearance. No axillary lymphadenopathy. Right internal jugular single-lumen porta cath with tip terminating in the distal superior vena cava. Lungs/Pleura: Again noted are multiple pulmonary nodules in the right lower lobe. These appear generally increased in size compared to the prior examination from 05/13/2015, including the largest of these nodules which currently measures 1.6 x 2.3 cm (previously 1.3 x 1.8 cm on 05/13/2015). There is a suture line in the right lower lobe, presumably from prior wedge resection. Extensive architectural distortion in the periphery of the left lung, most notably in the left upper lobe, around the site of the previously noted left upper lobe nodule, most compatible with evolving postradiation changes. Multiple new areas of nodular architectural distortion are noted in this region, best demonstrated on image 72 of series 3. Diffuse bronchial wall  thickening with moderate to severe centrilobular and paraseptal emphysema. Multifocal mucous plugging is identified in the lungs bilaterally. No pleural effusions. Upper Abdomen: Multiple renal lesions are again noted in the visualized portions of the kidney, largest of which measures 3.1 cm in the medial interpolar region of the left kidney. The majority of these lesions are compatible with simple cysts. In addition, in the anterior aspect of the upper pole the right kidney there is an incompletely visualized lesion which measures 55 HU, previously characterized as a hemorrhagic/proteinaceous cyst. Atherosclerosis in the abdominal aorta. Musculoskeletal/Soft Tissues: There are no aggressive appearing lytic or blastic lesions noted in the visualized portions of the skeleton. IMPRESSION: 1. Today's study demonstrates progression of disease in the right lower lobe with interval increase in size of numerous previously noted right lower lobe pulmonary nodules, as discussed above. 2. In the left upper lobe and periphery of the left lower lobe there or what appear to be evolving postradiation changes of radiation pneumonitis and developing fibrosis at site of the previously treated left upper lobe nodule. Continued attention on followup studies is recommended. 3. Diffuse bronchial wall thickening with moderate to severe centrilobular and paraseptal emphysema; imaging findings compatible with reported clinical history of COPD. 4. Atherosclerosis, including left anterior descending coronary artery disease. Assessment for potential risk factor modification, dietary therapy or pharmacologic therapy may be warranted, if clinically indicated. 5. Additional incidental findings, as above. Electronically Signed   By: Vinnie Langton M.D.   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  PLAN:    1.  Hodgkin's lymphoma: No evidence of disease.   Previously noted pulmonary nodule consistent with lung cancer.  Continue to monitor routinely with CT scans. 2.  Left upper lobe lung cancer: CT scan results reviewed independently and reported as above. Patient completed XRT the left upper lobe lung nodule which was biopsied and confirmed squamous cell carcinoma.   Case was previously discussed at cancer conference and a recommendation was to continue to monitor with periodic CT scans. Previously, patient refused any further surgery or chemotherapy.  3. Right lower lobe nodule: CT scan results as above. Nodule has increased in size. Case was discussed at cancer conference and recommendation was to get PET scan to further evaluate. Patient will return to clinic on December 15, 2015 to discuss the results and any additional treatment planning if necessary.  4.  Pancytopenia: Patient's WBC count, hemoglobin, and platelet counts are all decreased but essentially unchanged and stable. She is also asymptomatic. No intervention  is needed at this time. This is possibly related to myelosuppression from previous chemotherapy from Hodgkin's. Continue to monitor.   5. Hypercalcemia: Patient's calcium is 10.8 today. Unclear etiology, but possibly related to underlying malignancy. Monitor.  Patient expressed understanding and was in agreement with this plan. She also understands that She can call clinic at any time with any questions, concerns, or complaints.   Lloyd Huger, MD   11/29/2015 6:32 AM

## 2015-12-03 ENCOUNTER — Ambulatory Visit: Payer: Medicare Other

## 2015-12-04 ENCOUNTER — Ambulatory Visit
Admission: RE | Admit: 2015-12-04 | Discharge: 2015-12-04 | Disposition: A | Discharge status: Home / Self Care | Payer: Medicare Other | Source: Ambulatory Visit | Attending: Radiation Oncology | Admitting: Radiation Oncology

## 2015-12-04 ENCOUNTER — Encounter: Payer: Self-pay | Admitting: Radiation Oncology

## 2015-12-04 VITALS — BP 122/62 | HR 91 | Temp 96.1°F | Resp 20 | Wt 145.5 lb

## 2015-12-04 DIAGNOSIS — C3412 Malignant neoplasm of upper lobe, left bronchus or lung: Secondary | ICD-10-CM

## 2015-12-04 DIAGNOSIS — Z923 Personal history of irradiation: Secondary | ICD-10-CM | POA: Diagnosis not present

## 2015-12-04 DIAGNOSIS — R918 Other nonspecific abnormal finding of lung field: Secondary | ICD-10-CM | POA: Diagnosis not present

## 2015-12-04 NOTE — Progress Notes (Signed)
Radiation Oncology Follow up Note  Name: Marie Lowe   Date:   12/04/2015 MRN:  [id number] DOB: 20-Apr-1941    This 75 y.o. 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   REFERRING PROVIDER: Marinda Elk, MD; Kathlene November. Grayland Ormond, M.D.  CHIEF COMPLAINT:  Chief Complaint  Patient presents with  . Lung Cancer    Pt is here for 1 year follow up of SBRT    DIAGNOSIS: The encounter diagnosis was Malignant neoplasm of upper lobe of left lung ([place]).   HPI: Marie Lowe returned today for routine follow up evaluation one year after completing a definitive course of SBRT directed to a left upper lobe squamous cell carcinoma and 2.5 years after completing a course of involved field radiation therapy for Hodgkin's lymphoma.  Since she was last evaluated in our clinic on 05/29/15 she has experienced some increase in her shortness of breath and dyspnea on exertion that enables her to walk up less than one flight of stairs before becoming acutely short of breath.  She also notes a persistent cough with white phlegm production but not evidence of hemoptysis.  She is currently using a Ventolin inhaler and an Advair inhaler.  Her weight and appetite have remained stable.  She reports moderate fatigue and reports that she gets tired quite easily.  On 11/16/15 she underwent a CT of the chest that revealed progression of disease in the right lower lobe with an increase in the right lower lobe pulmonary nodules.  She is scheduled to undergo a PET/CT scan on 7/17/17and follow-up with Dr. Grayland Ormond on 01/15/16.    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    PAST SURGICAL HISTORY:  Past Surgical History  Procedure Laterality Date  . Abdominal hysterectomy    . Appendectomy    . Right lower lobe wedge resection    . Nasal sinus surgery    . Colonoscopy with propofol N/A 10/01/2015    Procedure: COLONOSCOPY WITH PROPOFOL;  Surgeon: Manya Silvas, MD;  Location: [facility] ENDOSCOPY;  Service: Endoscopy;  Laterality: N/A;    FAMILY HISTORY: family history includes Breast cancer in her maternal aunt; CAD in her other; Cancer in her other; Diabetes in her other; Kidney disease in her other; Prostate cancer in her father. There is no history of Kidney cancer.  SOCIAL HISTORY:  reports that she has quit smoking. Her smoking use included Cigarettes. She has a 18 pack-year smoking history. She has never used smokeless tobacco. She reports that she drinks about 0.6 oz of alcohol per week. She reports that she does not use illicit drugs.  ALLERGIES: Hydrochlorothiazide  MEDICATIONS:  Current Outpatient Prescriptions  Medication Sig Dispense Refill  . albuterol (PROVENTIL HFA;VENTOLIN HFA) 108 (90 BASE) MCG/ACT inhaler Inhale 2 puffs into the lungs every 6 (six) hours as needed for wheezing or shortness of breath.    Marland Kitchen amLODipine-benazepril (LOTREL) 10-40 MG per capsule Take 1 capsule by mouth daily.  3  . antiseptic oral rinse (BIOTENE) LIQD 15 mLs by Mouth Rinse route as  needed for dry mouth.    Marland Kitchen atorvastatin (LIPITOR) 10 MG tablet     . calcium carbonate (OS-CAL - DOSED IN MG OF ELEMENTAL CALCIUM) 1250 (500 Ca) MG tablet Take by mouth.    . calcium carbonate (TUMS - DOSED IN MG ELEMENTAL CALCIUM) 500 MG chewable tablet Chew 1 tablet by mouth 2 (two) times daily.    . carvedilol (COREG) 6.25 MG tablet Take 6.25 mg by mouth 2 (two) times daily.  5   . Cyanocobalamin (VITAMIN B-12 CR PO) Take by mouth.    . Multiple Vitamins-Minerals (MULTIVITAMIN PO) Take 1 tablet by mouth.    . ranitidine (ZANTAC) 150 MG tablet     . SPIRIVA HANDIHALER 18 MCG inhalation capsule     . triamcinolone cream (KENALOG) 0.1 % Apply 1 application topically 2 (two) times daily.     No current facility-administered medications for this encounter.   Facility-Administered Medications Ordered in Other Encounters  Medication Dose Route Frequency Provider Last Rate Last Dose  . heparin lock flush 100 unit/mL  500 Units Intravenous Once Lloyd Huger, MD      . sodium chloride 0.9 % injection 10 mL  10 mL Intravenous PRN Lloyd Huger, MD   10 mL at 02/09/15 0844    REVIEW OF SYSTEMS:  Review of Systems  Constitutional: Positive for malaise/fatigue. Negative for fever, chills and weight loss.  HENT: Negative for nosebleeds.   Eyes: Negative for blurred vision, photophobia and redness.  Respiratory: Positive for cough, sputum production and shortness of breath. Negative for hemoptysis and wheezing.   Cardiovascular: Negative for chest pain and palpitations.  Gastrointestinal: Negative for nausea, vomiting, abdominal pain and blood in stool.  Genitourinary: Negative for dysuria and hematuria.  Musculoskeletal: Positive for myalgias, back pain and joint pain.  Skin: Negative for itching and rash.  Neurological: Positive for weakness. Negative for dizziness, speech change, seizures and headaches.  Endo/Heme/Allergies: Does not bruise/bleed easily.  Psychiatric/Behavioral: Negative for suicidal ideas and hallucinations.     PHYSICAL EXAM: BP 122/62 mmHg  Pulse 91  Temp(Src) 96.1 F (35.6 C)  Resp 20  Wt 145 lb 8.1 oz (66 kg)  Physical Exam  Constitutional: She is oriented to person, place, and time and well-developed, well-nourished, and in no distress.  HENT:  Head: Normocephalic and atraumatic.  Mouth/Throat: Oropharynx is clear and moist.   Eyes: Conjunctivae and EOM are normal. Pupils are equal, round, and reactive to light.  Neck: No thyromegaly present.  Cardiovascular: Normal rate.   Pulmonary/Chest: Breath sounds normal. No stridor. No respiratory distress. She has no wheezes. She has no rales.  Abdominal: She exhibits no distension and no mass. There is no tenderness.  Musculoskeletal: Normal range of motion. She exhibits no tenderness.  Lymphadenopathy:    She has no cervical adenopathy.  Neurological: She is alert and oriented to person, place, and time. No cranial nerve deficit. Gait normal.  Skin: Skin is warm and dry. No erythema.  Psychiatric: Memory and affect normal.  Marie Lowe is accompanied today by her loving and very supportive husband.  RADIOLOGY RESULTS:   Ct Chest W Contrast  11/16/2015  CLINICAL DATA:  75 year old female with history of lung cancer originally diagnosed in 2014 treated with chemotherapy and radiation therapy. Follow-up study. EXAM: CT CHEST WITH CONTRAST TECHNIQUE: Multidetector CT imaging of the chest was performed during intravenous contrast administration. CONTRAST:  58m ISOVUE-300 IOPAMIDOL (ISOVUE-300) INJECTION 61% COMPARISON:  Chest CT 05/13/2015. FINDINGS: Mediastinum/Lymph Nodes: Heart size is  borderline enlarged. There is no significant pericardial fluid, thickening or pericardial calcification. There is atherosclerosis of the thoracic aorta, the great vessels of the mediastinum and the coronary arteries, including calcified atherosclerotic plaque in the left anterior descending coronary artery. Mildly enlarged right hilar lymph node measuring 12 mm in short axis increased compared to the prior study. Borderline enlarged subcarinal lymph node measuring 9 mm in short axis also similar to the prior examination. Esophagus is unremarkable in appearance. No axillary lymphadenopathy. Right internal jugular single-lumen porta cath with tip terminating in the distal superior vena cava.  Lungs/Pleura: Again noted are multiple pulmonary nodules in the right lower lobe. These appear generally increased in size compared to the prior examination from 05/13/2015, including the largest of these nodules which currently measures 1.6 x 2.3 cm (previously 1.3 x 1.8 cm on 05/13/2015). There is a suture line in the right lower lobe, presumably from prior wedge resection. Extensive architectural distortion in the periphery of the left lung, most notably in the left upper lobe, around the site of the previously noted left upper lobe nodule, most compatible with evolving postradiation changes. Multiple new areas of nodular architectural distortion are noted in this region, best demonstrated on image 72 of series 3. Diffuse bronchial wall thickening with moderate to severe centrilobular and paraseptal emphysema. Multifocal mucous plugging is identified in the lungs bilaterally. No pleural effusions. Upper Abdomen: Multiple renal lesions are again noted in the visualized portions of the kidney, largest of which measures 3.1 cm in the medial interpolar region of the left kidney. The majority of these lesions are compatible with simple cysts. In addition, in the anterior aspect of the upper pole the right kidney there is an incompletely visualized lesion which measures 55 HU, previously characterized as a hemorrhagic/proteinaceous cyst. Atherosclerosis in the abdominal aorta. Musculoskeletal/Soft Tissues: There are no aggressive appearing lytic or blastic lesions noted in the visualized portions of the skeleton. IMPRESSION: 1. Today's study demonstrates progression of disease in the right lower lobe with interval increase in size of numerous previously noted right lower lobe pulmonary nodules, as discussed above. 2. In the left upper lobe and periphery of the left lower lobe there or what appear to be evolving postradiation changes of radiation pneumonitis and developing fibrosis at site of the previously treated left  upper lobe nodule. Continued attention on followup studies is recommended. 3. Diffuse bronchial wall thickening with moderate to severe centrilobular and paraseptal emphysema; imaging findings compatible with reported clinical history of COPD. 4. Atherosclerosis, including left anterior descending coronary artery disease. Assessment for potential risk factor modification, dietary therapy or pharmacologic therapy may be warranted, if clinically indicated. 5. Additional incidental findings, as above. Electronically Signed   By: Vinnie Langton M.D.   On: 11/16/2015 14:05     PLAN: On review of the Marie Lowe CT scan of the chest from 11/16/15 it appears that she has progressive disease within the right lower lobe with an increase in the size and number of suspicious nodules.  To further evaluate these lesions she will undergo a PET on 12/14/15 and follow-up with Dr. Grayland Ormond shortly thereafter.  Her case will most likely be presented once again at tumor board for formal evaluation and discussion in a multi-modality setting.  I have scheduled her to return for follow-up in radiation oncology in 6 months or sooner should problems arise.    As always, thank you very much for allowing me to participate in the care of this most pleasant and stoic  woman.    Consuello Bossier, MD

## 2015-12-07 ENCOUNTER — Ambulatory Visit: Payer: Medicare Other | Admitting: Oncology

## 2015-12-14 ENCOUNTER — Ambulatory Visit: Admission: RE | Admit: 2015-12-14 | Payer: Medicare Other | Source: Ambulatory Visit

## 2015-12-15 ENCOUNTER — Inpatient Hospital Stay: Payer: Medicare Other | Admitting: Oncology

## 2015-12-15 ENCOUNTER — Telehealth: Payer: Self-pay | Admitting: *Deleted

## 2015-12-15 NOTE — Telephone Encounter (Signed)
Attempted to call pt to discuss financial concerns regarding PET scan scheduled on 7/20. Pt did not answer. Will try to callback this afternoon.

## 2015-12-15 NOTE — Telephone Encounter (Signed)
Spoke with patient who wants our office to start appeals process to have secondary insurance covering remaining cost of PET scan. PET on Thursday and follow up on Monday will be rescheduled. Pt is aware and is in agreement with plan to reschedule scans. Margreta Journey will speak with scheduling to get appts rescheduled and notify us with process to appeal PET scan denial.

## 2015-12-15 NOTE — Telephone Encounter (Signed)
Thank you :)

## 2015-12-17 ENCOUNTER — Ambulatory Visit: Payer: Medicare Other

## 2015-12-21 ENCOUNTER — Ambulatory Visit: Payer: Medicare Other | Admitting: Oncology

## 2015-12-29 ENCOUNTER — Ambulatory Visit: Payer: Medicare Other

## 2015-12-31 ENCOUNTER — Other Ambulatory Visit: Payer: Self-pay | Admitting: Oncology

## 2015-12-31 ENCOUNTER — Ambulatory Visit: Payer: Medicare Other | Admitting: Oncology

## 2015-12-31 DIAGNOSIS — C3431 Malignant neoplasm of lower lobe, right bronchus or lung: Secondary | ICD-10-CM

## 2016-01-05 ENCOUNTER — Ambulatory Visit: Payer: Medicare Other

## 2016-01-11 ENCOUNTER — Ambulatory Visit: Payer: Medicare Other | Admitting: Oncology

## 2016-01-20 ENCOUNTER — Ambulatory Visit
Admission: RE | Admit: 2016-01-20 | Discharge: 2016-01-20 | Disposition: A | Discharge status: Home / Self Care | Payer: Medicare Other | Source: Ambulatory Visit | Attending: Oncology | Admitting: Oncology

## 2016-01-20 DIAGNOSIS — C3431 Malignant neoplasm of lower lobe, right bronchus or lung: Secondary | ICD-10-CM | POA: Insufficient documentation

## 2016-01-20 DIAGNOSIS — I700 Atherosclerosis of aorta: Secondary | ICD-10-CM | POA: Insufficient documentation

## 2016-01-20 DIAGNOSIS — I2510 Atherosclerotic heart disease of native coronary artery without angina pectoris: Secondary | ICD-10-CM | POA: Diagnosis not present

## 2016-01-20 LAB — POCT I-STAT CREATININE: CREATININE: 1.4 mg/dL — AB (ref 0.44–1.00)

## 2016-01-20 MED ORDER — IOPAMIDOL (ISOVUE-300) INJECTION 61%
75.0000 mL | Freq: Once | INTRAVENOUS | Status: AC | PRN
  Administered 2016-01-20: 60 mL via INTRAVENOUS

## 2016-01-24 DIAGNOSIS — R911 Solitary pulmonary nodule: Secondary | ICD-10-CM | POA: Insufficient documentation

## 2016-01-24 DIAGNOSIS — C8190 Hodgkin lymphoma, unspecified, unspecified site: Secondary | ICD-10-CM | POA: Insufficient documentation

## 2016-01-24 DIAGNOSIS — C3412 Malignant neoplasm of upper lobe, left bronchus or lung: Secondary | ICD-10-CM | POA: Insufficient documentation

## 2016-01-24 NOTE — Progress Notes (Signed)
[facility]  [phone]) [phone] Fax:(336) [phone]  ID: Marie Lowe OB: 05-27-1941  MR#: [id number]  ZHG#:[id number]  Patient Care Team: Marinda Elk, MD as PCP - General (Physician Assistant)  CHIEF COMPLAINT: Clinical stage II Hodgkin lymphoma, pathologic stage Ia squamous cell carcinoma of the lung, right pulmonary nodule.  INTERVAL HISTORY: Patient returns to clinic today for routine evaluation and discussion of her CT scan results. She continues to feel well and remains asymptomatic. She has occasional constipation. She has a good appetite and her weight is stable. She denies any fevers or night sweats. She has no neurologic complaints.  She denies any nausea, vomiting, or diarrhea. She has no chest pain, cough, or shortness of breath. She has no urinary complaints.  Patient offers no further specific complaints today.  REVIEW OF SYSTEMS:   Review of Systems  Constitutional: Negative.  Negative for fever and malaise/fatigue.  Respiratory: Negative for cough, hemoptysis and shortness of breath.   Cardiovascular: Negative.  Negative for chest pain.  Gastrointestinal: Positive for constipation.  Musculoskeletal: Negative.   Neurological: Negative.  Negative for weakness.  Endo/Heme/Allergies: Negative.   Psychiatric/Behavioral: Positive for memory loss.    As per HPI. Otherwise, a complete review of systems is negatve.  PAST MEDICAL HISTORY: Past Medical History:  Diagnosis Date  . Anemia   . Chest pain, unspecified   . COPD (chronic obstructive pulmonary disease) ([place])   . Epistaxis   . Erosive gastropathy   . GERD (gastroesophageal reflux disease)   . High cholesterol   . Hodgkin's lymphoma ([place])    chemo  . Hodgkin's lymphoma ([place])   . Hypercalcemia   . Hypertension   . Lung cancer ([place]) 2015  . Pneumonia   . 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 Shortness of breath dyspnea     PAST SURGICAL HISTORY: Past Surgical History:    Procedure Laterality Date  . ABDOMINAL HYSTERECTOMY    . APPENDECTOMY    . COLONOSCOPY WITH PROPOFOL N/A 10/01/2015   Procedure: COLONOSCOPY WITH PROPOFOL;  Surgeon: Manya Silvas, MD;  Location: [facility] ENDOSCOPY;  Service: Endoscopy;  Laterality: N/A;  . NASAL SINUS SURGERY    . right lower lobe wedge resection      FAMILY HISTORY Family History  Problem Relation Age of Onset  . Diabetes Other   . CAD Other   . Cancer Other     colon cancer, breast cancer  . Kidney disease Other   . Breast cancer Maternal Aunt   . Prostate cancer Father   . Kidney cancer Neg Hx        ADVANCED DIRECTIVES:    HEALTH MAINTENANCE: Social History  Substance Use Topics  . Smoking status: Former Smoker    Packs/day: 1.00    Years: 18.00    Types: Cigarettes  . Smokeless tobacco: Never Used  . Alcohol use 0.6 oz/week    1 Glasses of wine per week     Comment: Occassionaly     Colonoscopy:  PAP:  Bone density:  Lipid panel:  Allergies  Allergen Reactions  . Hydrochlorothiazide Other (See Comments)    Elevation of creatinine Patient doesn't remember what reaction she had to it.    Current Outpatient Prescriptions  Medication Sig Dispense Refill  . albuterol (PROVENTIL HFA;VENTOLIN HFA) 108 (90 BASE) MCG/ACT inhaler Inhale 2 puffs into the lungs every 6 (six) hours as needed for wheezing or shortness of breath.    Marland Kitchen amLODipine-benazepril (LOTREL) 10-40 MG per capsule Take  1 capsule by mouth daily.  3  . antiseptic oral rinse (BIOTENE) LIQD 15 mLs by Mouth Rinse route as needed for dry mouth.    Marland Kitchen atorvastatin (LIPITOR) 10 MG tablet     . calcium carbonate (OS-CAL - DOSED IN MG OF ELEMENTAL CALCIUM) 1250 (500 Ca) MG tablet Take by mouth.    . carvedilol (COREG) 6.25 MG tablet Take 6.25 mg by mouth 2 (two) times daily.  5  . Cyanocobalamin (VITAMIN B-12 CR PO) Take by mouth.    . Fluticasone-Salmeterol (ADVAIR DISKUS) 100-50 MCG/DOSE AEPB Inhale into the lungs.    . mirtazapine  (REMERON) 15 MG tablet     . Multiple Vitamins-Minerals (MULTIVITAMIN PO) Take 1 tablet by mouth.    . ranitidine (ZANTAC) 150 MG tablet     . triamcinolone cream (KENALOG) 0.1 % Apply 1 application topically 2 (two) times daily.     No current facility-administered medications for this visit.    Facility-Administered Medications Ordered in Other Visits  Medication Dose Route Frequency Provider Last Rate Last Dose  . heparin lock flush 100 unit/mL  500 Units Intravenous Once Lloyd Huger, MD      . sodium chloride 0.9 % injection 10 mL  10 mL Intravenous PRN Lloyd Huger, MD   10 mL at 02/09/15 0844    OBJECTIVE: Vitals:   01/25/16 1032  BP: 113/63  Pulse: 82  Resp: 17  Temp: (!) 95.3 F (35.2 C)     Body mass index is 23.36 kg/m.    ECOG FS:0 - Asymptomatic  General: Well-developed, well-nourished, no acute distress. Eyes: anicteric sclera. HEENT: No palpable lymphadenopathy.  Lungs: Clear to auscultation bilaterally. Heart: Regular rate and rhythm. No rubs, murmurs, or gallops. Abdomen: Soft, nontender, nondistended. No organomegaly noted, normoactive bowel sounds. Musculoskeletal: No edema, cyanosis, or clubbing. Neuro: Alert, answering all questions appropriately. Cranial nerves grossly intact. Skin: No rashes or petechiae noted. Psych: Normal affect.   LAB RESULTS:  Lab Results  Component Value Date   NA 138 11/16/2015   K 3.9 11/16/2015   CL 105 11/16/2015   CO2 28 11/16/2015   GLUCOSE 86 11/16/2015   BUN 23 (H) 11/16/2015   CREATININE 1.40 (H) 01/20/2016   CALCIUM 10.8 (H) 11/16/2015   PROT 7.4 02/09/2015   ALBUMIN 3.3 (L) 02/09/2015   AST 51 (H) 02/09/2015   ALT 39 02/09/2015   ALKPHOS 380 (H) 02/09/2015   BILITOT 1.5 (H) 02/09/2015   GFRNONAA 39 (L) 11/16/2015   GFRAA 46 (L) 11/16/2015    Lab Results  Component Value Date   WBC 2.1 (L) 11/16/2015   NEUTROABS 0.5 (L) 11/16/2015   HGB 12.1 11/16/2015   HCT 35.7 11/16/2015   MCV 90.7  11/16/2015   PLT 129 (L) 11/16/2015     STUDIES: Ct Chest W Contrast  Result Date: 01/20/2016 CLINICAL DATA:  Restaging right lung cancer. History of chemotherapy and radiation therapy approximately 3 years scalp. EXAM: CT CHEST WITH CONTRAST TECHNIQUE: Multidetector CT imaging of the chest was performed during intravenous contrast administration. CONTRAST:  3m ISOVUE-300 IOPAMIDOL (ISOVUE-300) INJECTION 61% COMPARISON:  CT scan 11/16/2015. FINDINGS: Chest wall: No breast masses, supraclavicular or axillary lymphadenopathy. A right-sided Port-A-Cath is noted. The thyroid gland is grossly normal. Cardiovascular: The heart is mildly enlarged but stable. No pericardial effusion. Stable atherosclerotic calcifications involving the aorta and coronary arteries. No aneurysm or dissection. Mediastinum/Nodes: Stable mediastinal and hilar lymph nodes. 13 mm right hilar node on image 67 is stable.  9 mm subcarinal lymph node on image 68 is stable. No new lymph nodes. Lungs/Pleura: Severe emphysematous changes and extensive pulmonary scarring. Persistent confluent patchy airspace disease in the left upper lobe along with peribronchial thickening. This could be chronic inflammation or possible radiation change. Right lower lobe nodular lesions appear relatively stable. The most cephalad lesion measures 11.5 x 8.5 mm on image number 72. This previously measured 15 x 9 mm. The lesion surrounding the seizures on image number 84 measures 23 x 15 mm and previously measured 23 x 15 mm. The more inferior lesion on image number 107 measures 16.5 x 11.5 mm and previously measured 16 x 12 mm. No new pulmonary lesions.  No pleural effusion. Upper Abdomen: No significant upper abdominal findings. No evidence of hepatic or adrenal gland metastasis. Subdiaphragmatic lymph node on image number 127 measures 11 mm and is stable. Stable renal cysts. Advanced atherosclerotic calcifications involving the abdominal aorta. Musculoskeletal:  No significant bony findings. IMPRESSION: 1. Overall stable CT appearance of the chest. 2. Stable severe emphysematous changes and pulmonary scarring. 3. Stable irregular nodular densities in the right lower lobe as described above. No new or progressive findings. 4. Stable patchy confluent airspace disease and peribronchial thickening in the left upper lobe and to a lesser extent in the left lower lobe. This could be chronic inflammation or possible radiation change. 5. Stable mediastinal and hilar lymph nodes. 6. Stable advanced atherosclerotic calcifications involving the thoracic aorta, upper abdominal aorta and coronary arteries. Electronically Signed   By: Marijo Sanes M.D.   On: 01/20/2016 13:21    ASSESSMENT: Clinical stage II Hodgkin lymphoma, pathologic stage Ia squamous cell carcinoma of the lung, right pulmonary nodule.  PLAN:    1.  Hodgkin's lymphoma: No evidence of disease.  Previously noted pulmonary nodule consistent with lung cancer.  Continue to monitor routinely with CT scans. 2.  Left upper lobe lung cancer: CT scan results reviewed independently and reported as above. Patient completed XRT the left upper lobe lung nodule which was biopsied and confirmed squamous cell carcinoma.   Case was previously discussed at cancer conference and a recommendation was to continue to monitor with periodic CT scans. Previously, patient refused any further surgery or chemotherapy.  3. Right lower lobe nodule: CT scan results as above. Nodular densities are essentially stable. No further intervention is needed at this time. Repeat CT scan at the end of January to coincide with her husband's restaging CT to minimize appointments.  4.  Pancytopenia: Patient's WBC count and platelet counts are all decreased but essentially unchanged and stable. She is no longer anemic. She is also asymptomatic. No intervention is needed at this time. This is possibly related to myelosuppression from previous chemotherapy  from Hodgkin's. Continue to monitor.   5. Hypercalcemia: Unclear etiology, Monitor.  Patient expressed understanding and was in agreement with this plan. She also understands that She can call clinic at any time with any questions, concerns, or complaints.   Lloyd Huger, MD   01/28/2016 9:27 AM

## 2016-01-25 ENCOUNTER — Inpatient Hospital Stay: Payer: Medicare Other | Attending: Oncology | Admitting: Oncology

## 2016-01-25 ENCOUNTER — Encounter: Payer: Self-pay | Admitting: Oncology

## 2016-01-25 VITALS — BP 113/63 | HR 82 | Temp 95.3°F | Resp 17 | Ht 66.0 in | Wt 144.7 lb

## 2016-01-25 DIAGNOSIS — K5900 Constipation, unspecified: Secondary | ICD-10-CM | POA: Insufficient documentation

## 2016-01-25 DIAGNOSIS — J449 Chronic obstructive pulmonary disease, unspecified: Secondary | ICD-10-CM | POA: Diagnosis not present

## 2016-01-25 DIAGNOSIS — E7800 Pure hypercholesterolemia, unspecified: Secondary | ICD-10-CM | POA: Diagnosis not present

## 2016-01-25 DIAGNOSIS — Z8571 Personal history of Hodgkin lymphoma: Secondary | ICD-10-CM

## 2016-01-25 DIAGNOSIS — C3412 Malignant neoplasm of upper lobe, left bronchus or lung: Secondary | ICD-10-CM | POA: Diagnosis not present

## 2016-01-25 DIAGNOSIS — Z87891 Personal history of nicotine dependence: Secondary | ICD-10-CM | POA: Diagnosis not present

## 2016-01-25 DIAGNOSIS — Z79899 Other long term (current) drug therapy: Secondary | ICD-10-CM

## 2016-01-25 DIAGNOSIS — R413 Other amnesia: Secondary | ICD-10-CM | POA: Diagnosis not present

## 2016-01-25 DIAGNOSIS — C8111 Nodular sclerosis classical Hodgkin lymphoma, lymph nodes of head, face, and neck: Secondary | ICD-10-CM

## 2016-01-25 DIAGNOSIS — Z803 Family history of malignant neoplasm of breast: Secondary | ICD-10-CM | POA: Insufficient documentation

## 2016-01-25 DIAGNOSIS — Z8051 Family history of malignant neoplasm of kidney: Secondary | ICD-10-CM | POA: Insufficient documentation

## 2016-01-25 DIAGNOSIS — Z8042 Family history of malignant neoplasm of prostate: Secondary | ICD-10-CM | POA: Insufficient documentation

## 2016-01-25 DIAGNOSIS — R911 Solitary pulmonary nodule: Secondary | ICD-10-CM | POA: Insufficient documentation

## 2016-01-25 DIAGNOSIS — I10 Essential (primary) hypertension: Secondary | ICD-10-CM | POA: Diagnosis not present

## 2016-01-25 DIAGNOSIS — K219 Gastro-esophageal reflux disease without esophagitis: Secondary | ICD-10-CM | POA: Insufficient documentation

## 2016-01-25 DIAGNOSIS — Z923 Personal history of irradiation: Secondary | ICD-10-CM | POA: Diagnosis not present

## 2016-03-25 ENCOUNTER — Encounter: Payer: Self-pay | Admitting: *Deleted

## 2016-05-12 ENCOUNTER — Other Ambulatory Visit: Payer: Self-pay | Admitting: Physician Assistant

## 2016-05-12 DIAGNOSIS — Z1231 Encounter for screening mammogram for malignant neoplasm of breast: Secondary | ICD-10-CM

## 2016-06-10 ENCOUNTER — Ambulatory Visit: Payer: Medicare Other | Admitting: Radiation Oncology

## 2016-06-15 ENCOUNTER — Ambulatory Visit: Payer: BC Managed Care – PPO | Admitting: Radiation Oncology

## 2016-06-17 ENCOUNTER — Ambulatory Visit
Admission: RE | Admit: 2016-06-17 | Discharge: 2016-06-17 | Disposition: A | Discharge status: Home / Self Care | Payer: Medicare Other | Source: Ambulatory Visit | Attending: Physician Assistant | Admitting: Physician Assistant

## 2016-06-17 DIAGNOSIS — Z1231 Encounter for screening mammogram for malignant neoplasm of breast: Secondary | ICD-10-CM

## 2016-06-23 ENCOUNTER — Ambulatory Visit
Admission: RE | Admit: 2016-06-23 | Discharge: 2016-06-23 | Disposition: A | Discharge status: Home / Self Care | Payer: Medicare Other | Source: Ambulatory Visit | Attending: Oncology | Admitting: Oncology

## 2016-06-23 DIAGNOSIS — M4802 Spinal stenosis, cervical region: Secondary | ICD-10-CM | POA: Diagnosis not present

## 2016-06-23 DIAGNOSIS — M47892 Other spondylosis, cervical region: Secondary | ICD-10-CM | POA: Diagnosis not present

## 2016-06-23 DIAGNOSIS — Z923 Personal history of irradiation: Secondary | ICD-10-CM | POA: Insufficient documentation

## 2016-06-23 DIAGNOSIS — R911 Solitary pulmonary nodule: Secondary | ICD-10-CM | POA: Insufficient documentation

## 2016-06-23 DIAGNOSIS — J439 Emphysema, unspecified: Secondary | ICD-10-CM | POA: Diagnosis not present

## 2016-06-23 DIAGNOSIS — Z8572 Personal history of non-Hodgkin lymphomas: Secondary | ICD-10-CM | POA: Insufficient documentation

## 2016-06-23 DIAGNOSIS — I700 Atherosclerosis of aorta: Secondary | ICD-10-CM | POA: Insufficient documentation

## 2016-06-23 DIAGNOSIS — Z85118 Personal history of other malignant neoplasm of bronchus and lung: Secondary | ICD-10-CM | POA: Diagnosis not present

## 2016-06-23 DIAGNOSIS — C8111 Nodular sclerosis classical Hodgkin lymphoma, lymph nodes of head, face, and neck: Secondary | ICD-10-CM

## 2016-06-23 DIAGNOSIS — I6529 Occlusion and stenosis of unspecified carotid artery: Secondary | ICD-10-CM | POA: Diagnosis not present

## 2016-06-23 DIAGNOSIS — R590 Localized enlarged lymph nodes: Secondary | ICD-10-CM | POA: Insufficient documentation

## 2016-06-23 DIAGNOSIS — I2510 Atherosclerotic heart disease of native coronary artery without angina pectoris: Secondary | ICD-10-CM | POA: Diagnosis not present

## 2016-06-23 DIAGNOSIS — Z9221 Personal history of antineoplastic chemotherapy: Secondary | ICD-10-CM | POA: Insufficient documentation

## 2016-06-23 DIAGNOSIS — Z08 Encounter for follow-up examination after completed treatment for malignant neoplasm: Secondary | ICD-10-CM | POA: Insufficient documentation

## 2016-06-23 DIAGNOSIS — C3412 Malignant neoplasm of upper lobe, left bronchus or lung: Secondary | ICD-10-CM

## 2016-06-23 MED ORDER — IOPAMIDOL (ISOVUE-300) INJECTION 61%
75.0000 mL | Freq: Once | INTRAVENOUS | Status: AC | PRN
  Administered 2016-06-23: 75 mL via INTRAVENOUS

## 2016-06-26 NOTE — Progress Notes (Signed)
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  INTERVAL HISTORY: Patient returns to clinic today for routine evaluation and discussion of her CT scan results. She continues to feel well and remains asymptomatic. She has occasional constipation. She has a good appetite and her weight is stable. She denies any fevers or night sweats. She has no neurologic complaints.  She denies any nausea, vomiting, or diarrhea. She has no chest pain, cough, or shortness of breath. She has no urinary complaints.  Patient offers no further specific complaints today.  REVIEW OF SYSTEMS:   Review of Systems  Constitutional: Negative.  Negative for fever and malaise/fatigue.  Respiratory: Negative for cough, hemoptysis and shortness of breath.   Cardiovascular: Negative.  Negative for chest pain and leg swelling.  Gastrointestinal: Positive for constipation. Negative for abdominal pain.  Musculoskeletal: Negative.   Neurological: Negative.  Negative for weakness.  Endo/Heme/Allergies: Negative.   Psychiatric/Behavioral: Negative for memory loss. The patient is not nervous/anxious.     As per HPI. Otherwise, a complete review of systems is negative.  PAST MEDICAL HISTORY: Past Medical History:  Diagnosis Date  . Anemia   . Chest pain, unspecified   . COPD (chronic obstructive pulmonary disease) ([place])   . Epistaxis   . Erosive gastropathy   . GERD (gastroesophageal reflux disease)   . High cholesterol   . Hodgkin's lymphoma ([place]) 2014   chemo  . Hodgkin's lymphoma ([place])   . Hypercalcemia   . Hypertension   . Lung cancer ([place]) 2015  . Pneumonia   . 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  Shortness of breath dyspnea     PAST SURGICAL HISTORY: Past Surgical History:  Procedure Laterality Date  . ABDOMINAL HYSTERECTOMY    . APPENDECTOMY    . COLONOSCOPY WITH PROPOFOL N/A 10/01/2015   Procedure: COLONOSCOPY WITH PROPOFOL;  Surgeon: Manya Silvas, MD;  Location: [facility] ENDOSCOPY;  Service: Endoscopy;  Laterality: N/A;  . NASAL SINUS SURGERY    . right lower lobe wedge resection      FAMILY HISTORY Family History  Problem Relation Age of Onset  . Diabetes Other   . CAD Other   . Cancer Other     colon cancer, breast cancer  . Kidney disease Other   . Breast cancer Maternal Aunt   . Prostate cancer Father   . Kidney cancer Neg Hx        ADVANCED DIRECTIVES:    HEALTH MAINTENANCE: Social History  Substance Use Topics  . Smoking status: Former Smoker    Packs/day: 1.00    Years: 18.00    Types: Cigarettes  . Smokeless tobacco: Never Used  . Alcohol use 0.6 oz/week    1 Glasses of wine per week     Comment: Occassionaly     Colonoscopy:  PAP:  Bone density:  Lipid panel:  Allergies  Allergen Reactions  . Hydrochlorothiazide Other (See Comments)    Elevation of creatinine Patient doesn't remember what reaction she had to it.    Current Outpatient Prescriptions  Medication Sig Dispense Refill  . albuterol (PROVENTIL HFA;VENTOLIN HFA) 108 (90 BASE) MCG/ACT inhaler Inhale 2 puffs into the lungs every 6 (six) hours as needed for wheezing or shortness of  breath.    Marland Kitchen amLODipine-benazepril (LOTREL) 10-40 MG per capsule Take 1 capsule by mouth daily.  3  . antiseptic oral rinse (BIOTENE) LIQD 15 mLs by Mouth Rinse route as needed for dry mouth.    Marland Kitchen atorvastatin (LIPITOR) 10 MG tablet     . calcium carbonate (OS-CAL - DOSED IN MG OF ELEMENTAL CALCIUM) 1250 (500 Ca) MG tablet Take by mouth.    . carvedilol (COREG) 6.25 MG tablet Take 6.25 mg by mouth 2 (two) times daily.  5  . Cyanocobalamin (VITAMIN B-12 CR PO) Take by mouth.    . Fluticasone-Salmeterol  (ADVAIR DISKUS) 100-50 MCG/DOSE AEPB Inhale into the lungs.    . mirtazapine (REMERON) 15 MG tablet     . Multiple Vitamins-Minerals (MULTIVITAMIN PO) Take 1 tablet by mouth.    . ranitidine (ZANTAC) 150 MG tablet     . triamcinolone cream (KENALOG) 0.1 % Apply 1 application topically 2 (two) times daily.     No current facility-administered medications for this visit.    Facility-Administered Medications Ordered in Other Visits  Medication Dose Route Frequency Provider Last Rate Last Dose  . heparin lock flush 100 unit/mL  500 Units Intravenous Once Lloyd Huger, MD      . sodium chloride 0.9 % injection 10 mL  10 mL Intravenous PRN Lloyd Huger, MD   10 mL at 02/09/15 0844    OBJECTIVE: There were no vitals filed for this visit.   There is no height or weight on file to calculate BMI.    ECOG FS:0 - Asymptomatic  General: Well-developed, well-nourished, no acute distress. Eyes: anicteric sclera. HEENT: No palpable lymphadenopathy.  Lungs: Clear to auscultation bilaterally. Heart: Regular rate and rhythm. No rubs, murmurs, or gallops. Abdomen: Soft, nontender, nondistended. No organomegaly noted, normoactive bowel sounds. Musculoskeletal: No edema, cyanosis, or clubbing. Neuro: Alert, answering all questions appropriately. Cranial nerves grossly intact. Skin: No rashes or petechiae noted. Psych: Normal affect.   LAB RESULTS:  Lab Results  Component Value Date   NA 138 11/16/2015   K 3.9 11/16/2015   CL 105 11/16/2015   CO2 28 11/16/2015   GLUCOSE 86 11/16/2015   BUN 23 (H) 11/16/2015   CREATININE 1.40 (H) 01/20/2016   CALCIUM 10.8 (H) 11/16/2015   PROT 7.4 02/09/2015   ALBUMIN 3.3 (L) 02/09/2015   AST 51 (H) 02/09/2015   ALT 39 02/09/2015   ALKPHOS 380 (H) 02/09/2015   BILITOT 1.5 (H) 02/09/2015   GFRNONAA 39 (L) 11/16/2015   GFRAA 46 (L) 11/16/2015    Lab Results  Component Value Date   WBC 2.1 (L) 11/16/2015   NEUTROABS 0.5 (L) 11/16/2015   HGB  12.1 11/16/2015   HCT 35.7 11/16/2015   MCV 90.7 11/16/2015   PLT 129 (L) 11/16/2015     STUDIES: Ct Soft Tissue Neck W Contrast  Result Date: 06/23/2016 CLINICAL DATA:  76 y/o F; history of Hodgkin's lymphoma in 2014 with chemotherapy and radiation and right lung cancer with chemotherapy and radiation in 2015, 4 restaging. EXAM: CT NECK WITH CONTRAST TECHNIQUE: Multidetector CT imaging of the neck was performed using the standard protocol following the bolus administration of intravenous contrast. CONTRAST:  47m ISOVUE-300 IOPAMIDOL (ISOVUE-300) INJECTION 61% COMPARISON:  09/19/2014 CT of the neck. FINDINGS: Pharynx and larynx: Normal. No mass or swelling. Salivary glands: No inflammation, mass, or stone. Thyroid: Normal. Lymph nodes: None enlarged or abnormal density. Vascular: Moderate calcific atherosclerosis of carotid bifurcations without high-grade stenosis. Right IJ catheter  extends below the field of view into the SVC. Limited intracranial: Negative. Visualized orbits: Negative. Mastoids and visualized paranasal sinuses: Extensive paranasal sinus disease involving maxillary, ethmoid, and sphenoid sinuses, greatest in the maxillary sinuses, with chronic inflammatory changes and small fluid levels. Normally pneumatized mastoid air cells. Skeleton: No acute or aggressive process. Moderate cervical spondylosis and mild facet arthropathy. A disc protrusion at the C3-4 level results in at least moderate canal stenosis. Upper chest: Aortic atherosclerosis and severe emphysema of lung apices. Please refer to concurrent CT of the chest for further characterization of the lungs and mediastinum. Other: None. IMPRESSION: 1. No mass or lymphadenopathy of the neck. 2. Acute on chronic paranasal sinus disease. 3. Please refer to concurrent CT of the chest for further evaluation of lungs and mediastinum. Electronically Signed   By: Kristine Garbe M.D.   On: 06/23/2016 13:57   Ct Chest W  Contrast  Result Date: 06/23/2016 CLINICAL DATA:  Followup left upper lobe squamous cell carcinoma and Hodgkin lymphoma. Previous surgery, chemotherapy, and radiation therapy. EXAM: CT CHEST WITH CONTRAST TECHNIQUE: Multidetector CT imaging of the chest was performed during intravenous contrast administration. CONTRAST:  95m ISOVUE-300 IOPAMIDOL (ISOVUE-300) INJECTION 61% COMPARISON:  01/20/2016 FINDINGS: Cardiovascular: No acute findings. Aortic and coronary artery atherosclerosis. Mediastinum/Nodes: Stable 9 mm subcarinal lymph node on image 71/3. 12 mm right hilar lymph node is also stable on image 67/3. No new or increased lymphadenopathy identified. Lungs/Pleura: Moderate to severe emphysema is again demonstrated. Pleural-parenchymal scarring remains stable, most severe in the lingula. Irregular pulmonary nodule in the superior right lower lobe measuring 1.6 x 1.0 cm on image 75/4 has increased in size since previous study, when it measured 1.2 x 0.8 cm. Adjacent subpleural pulmonary nodule in the superior right lower lobe measures 2.3 x 1.7 cm on image 86/4. This is stable since most recent study, but shows gradual increase in size compared to 1.8 x 1.3 cm on earlier exam of 05/13/2015. These nodules are suspicious for bronchogenic carcinoma. A third pleural-based nodule in the posterior right lower lobe measures 9 x 9 cm on image 110/4 and shows no significant change compared with previous studies. No evidence of pleural effusion. Upper Abdomen: Small renal cystic lesions again seen in visualized portions of upper poles. No evidence of adrenal mass. Musculoskeletal:  No suspicious bone lesions. IMPRESSION: Mild increase in size of 2 adjacent irregular pulmonary nodules in superior right lower lobe, suspicious for bronchogenic carcinoma. Consider PET-CT scan for further evaluation. Stable mild right hilar and mediastinal lymphadenopathy. Stable moderate to severe emphysema and pleural-parenchymal scarring.  Aortic and coronary artery atherosclerosis. Electronically Signed   By: JEarle GellM.D.   On: 06/23/2016 16:19   Mm Digital Screening Bilateral  Result Date: 06/17/2016 CLINICAL DATA:  Screening. EXAM: DIGITAL SCREENING BILATERAL MAMMOGRAM WITH CAD COMPARISON:  Previous exam(s). ACR Breast Density Category d: The breast tissue is extremely dense, which lowers the sensitivity of mammography. FINDINGS: There are no findings suspicious for malignancy. Images were processed with CAD. IMPRESSION: No mammographic evidence of malignancy. A result letter of this screening mammogram will be mailed directly to the patient. RECOMMENDATION: Screening mammogram in one year. (Code:SM-B-01Y) BI-RADS CATEGORY  1: Negative. Electronically Signed   By: SCurlene DolphinM.D.   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  PLAN:    1.  Hodgkin's lymphoma: No evidence of disease.  Patient was initially  diagnosed in February 2013. Previously noted pulmonary nodule consistent with lung cancer.  Continue to monitor routinely with CT scans. 2.  Left upper lobe lung cancer: CT scan results reviewed independently and reported as above. Patient completed XRT the left upper lobe lung nodule which was biopsied and confirmed squamous cell carcinoma.   Case was previously discussed at cancer conference and a recommendation was to continue to monitor with periodic CT scans. Previously, patient refused any further surgery or chemotherapy.  3. Right lower lobe nodule: CT scan results as above. Nodular densities have mildly progressed. These are likely malignancy, but patient is hesitant to pursue treatment. Will continue to monitor closely and repeat CT scans in 6 months to coincide with her husband's restaging CT to minimize appointments. Patient will return to clinic one to 2 days after imaging. 4.  Pancytopenia: Patient's WBC  count and platelet counts are all decreased but essentially unchanged and stable. She is no longer anemic. She is also asymptomatic. No intervention is needed at this time. This is possibly related to myelosuppression from previous chemotherapy from Hodgkin's. Continue to monitor.   5. Hypercalcemia: Unclear etiology, Monitor. 6. Port removal: Patient has requested port removal and a referral has been sent to vascular surgery.  Patient expressed understanding and was in agreement with this plan. She also understands that She can call clinic at any time with any questions, concerns, or complaints.   Lloyd Huger, MD   06/29/2016 2:16 PM

## 2016-06-27 ENCOUNTER — Encounter: Payer: Self-pay | Admitting: Radiation Oncology

## 2016-06-27 ENCOUNTER — Inpatient Hospital Stay: Payer: Medicare Other | Attending: Oncology | Admitting: Oncology

## 2016-06-27 ENCOUNTER — Ambulatory Visit
Admission: RE | Admit: 2016-06-27 | Discharge: 2016-06-27 | Disposition: A | Discharge status: Home / Self Care | Payer: Medicare Other | Source: Ambulatory Visit | Attending: Radiation Oncology | Admitting: Radiation Oncology

## 2016-06-27 VITALS — BP 137/69 | HR 92 | Temp 97.0°F | Wt 146.5 lb

## 2016-06-27 DIAGNOSIS — Z923 Personal history of irradiation: Secondary | ICD-10-CM | POA: Diagnosis not present

## 2016-06-27 DIAGNOSIS — C3412 Malignant neoplasm of upper lobe, left bronchus or lung: Secondary | ICD-10-CM | POA: Diagnosis present

## 2016-06-27 DIAGNOSIS — I10 Essential (primary) hypertension: Secondary | ICD-10-CM | POA: Diagnosis not present

## 2016-06-27 DIAGNOSIS — Z79899 Other long term (current) drug therapy: Secondary | ICD-10-CM | POA: Insufficient documentation

## 2016-06-27 DIAGNOSIS — Z8042 Family history of malignant neoplasm of prostate: Secondary | ICD-10-CM | POA: Insufficient documentation

## 2016-06-27 DIAGNOSIS — D61818 Other pancytopenia: Secondary | ICD-10-CM | POA: Diagnosis not present

## 2016-06-27 DIAGNOSIS — Z9221 Personal history of antineoplastic chemotherapy: Secondary | ICD-10-CM | POA: Insufficient documentation

## 2016-06-27 DIAGNOSIS — J449 Chronic obstructive pulmonary disease, unspecified: Secondary | ICD-10-CM | POA: Diagnosis not present

## 2016-06-27 DIAGNOSIS — Z8571 Personal history of Hodgkin lymphoma: Secondary | ICD-10-CM | POA: Diagnosis not present

## 2016-06-27 DIAGNOSIS — Z803 Family history of malignant neoplasm of breast: Secondary | ICD-10-CM | POA: Insufficient documentation

## 2016-06-27 DIAGNOSIS — K5900 Constipation, unspecified: Secondary | ICD-10-CM | POA: Insufficient documentation

## 2016-06-27 DIAGNOSIS — E7800 Pure hypercholesterolemia, unspecified: Secondary | ICD-10-CM | POA: Diagnosis not present

## 2016-06-27 DIAGNOSIS — Z87891 Personal history of nicotine dependence: Secondary | ICD-10-CM | POA: Diagnosis not present

## 2016-06-27 DIAGNOSIS — R911 Solitary pulmonary nodule: Secondary | ICD-10-CM

## 2016-06-27 DIAGNOSIS — K219 Gastro-esophageal reflux disease without esophagitis: Secondary | ICD-10-CM | POA: Insufficient documentation

## 2016-06-27 DIAGNOSIS — C8111 Nodular sclerosis classical Hodgkin lymphoma, lymph nodes of head, face, and neck: Secondary | ICD-10-CM

## 2016-06-27 NOTE — Progress Notes (Signed)
Radiation Oncology Follow up Note  Name: Marie Lowe   Date:   06/27/2016 MRN:  [id number] DOB: [birth year]    This 76 y.o. female presents to the clinic today for 18 month follow-up status post SB RT to left upper lobe.  REFERRING PROVIDER: Marinda Elk, MD  HPI: Patient is a 76 year old female now out a year and a half having completed SB RT to her left upper lobe for stage I squamous cell carcinoma. Seen today in routine follow-up she is doing well. She specifically denies cough hemoptysis or chest tightness. Recently had a CT scan. Showing mild increase in size of 2 adjacent nodules in the superior segment of the right lower lobe. She's having a PET CT scan ordered by medical oncology.  COMPLICATIONS OF TREATMENT: none  FOLLOW UP COMPLIANCE: keeps appointments   PHYSICAL EXAM:  BP 137/69   Pulse 92   Temp 97 F (36.1 C)   Wt 146 lb 7.9 oz (66.4 kg)   BMI 23.65 kg/m  Well-developed well-nourished patient in NAD. HEENT reveals PERLA, EOMI, discs not visualized.  Oral cavity is clear. No oral mucosal lesions are identified. Neck is clear without evidence of cervical or supraclavicular adenopathy. Lungs are clear to A&P. Cardiac examination is essentially unremarkable with regular rate and rhythm without murmur rub or thrill. Abdomen is benign with no organomegaly or masses noted. Motor sensory and DTR levels are equal and symmetric in the upper and lower extremities. Cranial nerves II through XII are grossly intact. Proprioception is intact. No peripheral adenopathy or edema is identified. No motor or sensory levels are noted. Crude visual fields are within normal range.  RADIOLOGY RESULTS: CT scans are reviewed and compared to prior studies  PLAN: Present time she is an excellent response in the left upper lobe. Will have a PET CT scan to determine what we are dealing with in the right lower lobe. Should this be hypermetabolic she may warrant a CT-guided biopsy and we can  always treat with again SB RT treatment to that region. I've made the patient aware that there is some regression in her right lower lobe although it is minimal. We'll see her back accordingly sure PET scan be positive otherwise we'll see her back in 6 months for follow-up. Patient knows to call sooner with any concerns.  I would like to take this opportunity to thank you for allowing me to participate in the care of your patient.Armstead Peaks., MD

## 2016-06-27 NOTE — Progress Notes (Signed)
Patient states she does not have any taste.  She does not eat as much because of her taste.  She sometimes has problems sleeping.  C/o dry mouth and chapped lips.  Patient here today for CT results.

## 2016-07-12 ENCOUNTER — Ambulatory Visit (INDEPENDENT_AMBULATORY_CARE_PROVIDER_SITE_OTHER): Payer: Medicare Other | Admitting: Vascular Surgery

## 2016-07-12 ENCOUNTER — Encounter (INDEPENDENT_AMBULATORY_CARE_PROVIDER_SITE_OTHER): Payer: Self-pay | Admitting: Vascular Surgery

## 2016-07-12 VITALS — BP 136/62 | HR 89 | Resp 16 | Ht 65.5 in | Wt 144.6 lb

## 2016-07-12 DIAGNOSIS — C8111 Nodular sclerosis classical Hodgkin lymphoma, lymph nodes of head, face, and neck: Secondary | ICD-10-CM | POA: Diagnosis not present

## 2016-07-12 DIAGNOSIS — C3412 Malignant neoplasm of upper lobe, left bronchus or lung: Secondary | ICD-10-CM

## 2016-07-12 DIAGNOSIS — J441 Chronic obstructive pulmonary disease with (acute) exacerbation: Secondary | ICD-10-CM | POA: Insufficient documentation

## 2016-07-12 DIAGNOSIS — E785 Hyperlipidemia, unspecified: Secondary | ICD-10-CM | POA: Diagnosis not present

## 2016-07-12 NOTE — Progress Notes (Signed)
Patient ID: Marie Lowe, female   DOB: 11/04/1940, 76 y.o.   MRN: [id number]  Chief Complaint  Patient presents with  . New Patient (Initial Visit)    HPI Marie Lowe is a 75 y.o. female.  I am asked to see the patient by Dr. Grayland Ormond for evaluation of removal of their Port-A-Cath.  The patient reports Hodgkin's lymphoma and lung cancer as a history for which she received chemotherapy. The port was placed by she believes Dr. Genevive Bi about 3-4 years ago. The port has not been used now in about a year. She has not had it flushed and she does not think it works anymore. She does not have fever or chills. It is mildly uncomfortable under her shoulder, but not overtly painful. She does not have chest pain or shortness of breath.   Past Medical History:  Diagnosis Date  . Anemia   . Chest pain, unspecified   . COPD (chronic obstructive pulmonary disease) ([place])   . Epistaxis   . Erosive gastropathy   . GERD (gastroesophageal reflux disease)   . High cholesterol   . Hodgkin's lymphoma ([place]) 2014   chemo  . Hodgkin's lymphoma ([place])   . Hypercalcemia   . Hypertension   . Lung cancer ([place]) 2015  . Pneumonia   . 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 Shortness of breath dyspnea     Past Surgical History:  Procedure Laterality Date  . ABDOMINAL HYSTERECTOMY    . APPENDECTOMY    . COLONOSCOPY WITH PROPOFOL N/A 10/01/2015   Procedure: COLONOSCOPY WITH PROPOFOL;  Surgeon: Manya Silvas, MD;  Location: [facility] ENDOSCOPY;  Service: Endoscopy;  Laterality: N/A;  . NASAL SINUS SURGERY    . right lower lobe wedge resection      Family History  Problem Relation Age of Onset  . Diabetes Other   . CAD Other   . Cancer Other     colon cancer, breast cancer  . Kidney disease Other   . Breast cancer Maternal Aunt   . Prostate cancer Father   . Kidney cancer Neg Hx      Social History Social History  Substance Use Topics  . Smoking status: Former Smoker    Packs/day: 1.00   Years: 18.00    Types: Cigarettes  . Smokeless tobacco: Never Used  . Alcohol use 0.6 oz/week    1 Glasses of wine per week     Comment: Occassionaly  Married. Husband accompanies her today  Allergies  Allergen Reactions  . Hydrochlorothiazide Other (See Comments)    Elevation of creatinine Patient doesn't remember what reaction she had to it.    Current Outpatient Prescriptions  Medication Sig Dispense Refill  . albuterol (PROVENTIL HFA;VENTOLIN HFA) 108 (90 BASE) MCG/ACT inhaler Inhale 2 puffs into the lungs every 6 (six) hours as needed for wheezing or shortness of breath.    Marland Kitchen amLODipine-benazepril (LOTREL) 10-40 MG per capsule Take 1 capsule by mouth daily.  3  . antiseptic oral rinse (BIOTENE) LIQD 15 mLs by Mouth Rinse route as needed for dry mouth.    Marland Kitchen atorvastatin (LIPITOR) 10 MG tablet     . calcium carbonate (OS-CAL - DOSED IN MG OF ELEMENTAL CALCIUM) 1250 (500 Ca) MG tablet Take by mouth.    . carvedilol (COREG) 6.25 MG tablet Take 6.25 mg by mouth 2 (two) times daily.  5  . Cyanocobalamin (VITAMIN B-12 CR PO) Take by mouth.    . Fluticasone-Salmeterol (ADVAIR DISKUS) 100-50  MCG/DOSE AEPB Inhale into the lungs.    . mirtazapine (REMERON) 15 MG tablet     . Multiple Vitamins-Minerals (MULTIVITAMIN PO) Take 1 tablet by mouth.    . tiotropium (SPIRIVA) 18 MCG inhalation capsule Place 18 mcg into inhaler and inhale daily.    Marland Kitchen triamcinolone cream (KENALOG) 0.1 % Apply 1 application topically 2 (two) times daily.    . ranitidine (ZANTAC) 150 MG tablet      No current facility-administered medications for this visit.    Facility-Administered Medications Ordered in Other Visits  Medication Dose Route Frequency Provider Last Rate Last Dose  . heparin lock flush 100 unit/mL  500 Units Intravenous Once Lloyd Huger, MD      . sodium chloride 0.9 % injection 10 mL  10 mL Intravenous PRN Lloyd Huger, MD   10 mL at 02/09/15 0844      REVIEW OF SYSTEMS (Negative  unless checked)  Constitutional: '[]'$ Weight loss  '[]'$ Fever  '[]'$ Chills Cardiac: '[]'$ Chest pain   '[]'$ Chest pressure   '[]'$ Palpitations   '[]'$ Shortness of breath when laying flat   '[]'$ Shortness of breath at rest   '[x]'$ Shortness of breath with exertion. Vascular:  '[]'$ Pain in legs with walking   '[]'$ Pain in legs at rest   '[]'$ Pain in legs when laying flat   '[]'$ Claudication   '[]'$ Pain in feet when walking  '[]'$ Pain in feet at rest  '[]'$ Pain in feet when laying flat   '[]'$ History of DVT   '[]'$ Phlebitis   '[]'$ Swelling in legs   '[]'$ Varicose veins   '[]'$ Non-healing ulcers Pulmonary:   '[]'$ Uses home oxygen   '[]'$ Productive cough   '[]'$ Hemoptysis   '[]'$ Wheeze  '[x]'$ COPD   '[]'$ Asthma Neurologic:  '[]'$ Dizziness  '[]'$ Blackouts   '[]'$ Seizures   '[]'$ History of stroke   '[]'$ History of TIA  '[]'$ Aphasia   '[]'$ Temporary blindness   '[]'$ Dysphagia   '[]'$ Weakness or numbness in arms   '[]'$ Weakness or numbness in legs Musculoskeletal:  '[x]'$ Arthritis   '[]'$ Joint swelling   '[]'$ Joint pain   '[]'$ Low back pain Hematologic:  '[]'$ Easy bruising  '[]'$ Easy bleeding   '[]'$ Hypercoagulable state   '[]'$ Anemic  '[]'$ Hepatitis Gastrointestinal:  '[]'$ Blood in stool   '[]'$ Vomiting blood  '[]'$ Gastroesophageal reflux/heartburn   '[]'$ Abdominal pain Genitourinary:  '[]'$ Chronic kidney disease   '[]'$ Difficult urination  '[]'$ Frequent urination  '[]'$ Burning with urination   '[]'$ Hematuria Skin:  '[]'$ Rashes   '[]'$ Ulcers   '[]'$ Wounds Psychological:  '[]'$ History of anxiety   '[]'$  History of major depression.    Physical Exam BP 136/62   Pulse 89   Resp 16   Ht 5' 5.5" (1.664 m)   Wt 144 lb 9.6 oz (65.6 kg)   BMI 23.70 kg/m  Gen:  WD/WN, NAD Head: [place]/AT, No temporalis wasting. Prominent temp pulse not noted. Ear/Nose/Throat: Hearing grossly intact, nares w/o erythema or drainage, oropharynx w/o Erythema/Exudate Eyes: Conjunctiva clear, sclera non-icteric  Neck: trachea midline.  No JVD.  Pulmonary:  Good air movement, respirations not labored  Cardiac: RRR, normal S1, S2 Vascular: Port present in the subclavicular location Vessel Right Left    Radial Palpable Palpable                                   Gastrointestinal: soft, non-tender/non-distended. No guarding/reflex. No masses, surgical incisions, or scars. Musculoskeletal: M/S 5/5 throughout.  Extremities without ischemic changes.  No deformity or atrophy.  Neurologic: Sensation grossly intact in extremities.  Symmetrical.  Speech is fluent. Motor exam as listed above.  Psychiatric: Judgment intact, Mood & affect appropriate for pt's clinical situation. Dermatologic: No rashes or ulcers noted.  No cellulitis or open wounds. Lymph : No Cervical, Axillary, or Inguinal lymphadenopathy.   Radiology Ct Soft Tissue Neck W Contrast  Result Date: 06/23/2016 CLINICAL DATA:  76 y/o F; history of Hodgkin's lymphoma in 2014 with chemotherapy and radiation and right lung cancer with chemotherapy and radiation in 2015, 4 restaging. EXAM: CT NECK WITH CONTRAST TECHNIQUE: Multidetector CT imaging of the neck was performed using the standard protocol following the bolus administration of intravenous contrast. CONTRAST:  59m ISOVUE-300 IOPAMIDOL (ISOVUE-300) INJECTION 61% COMPARISON:  09/19/2014 CT of the neck. FINDINGS: Pharynx and larynx: Normal. No mass or swelling. Salivary glands: No inflammation, mass, or stone. Thyroid: Normal. Lymph nodes: None enlarged or abnormal density. Vascular: Moderate calcific atherosclerosis of carotid bifurcations without high-grade stenosis. Right IJ catheter extends below the field of view into the SVC. Limited intracranial: Negative. Visualized orbits: Negative. Mastoids and visualized paranasal sinuses: Extensive paranasal sinus disease involving maxillary, ethmoid, and sphenoid sinuses, greatest in the maxillary sinuses, with chronic inflammatory changes and small fluid levels. Normally pneumatized mastoid air cells. Skeleton: No acute or aggressive process. Moderate cervical spondylosis and mild facet arthropathy. A disc protrusion at the C3-4 level  results in at least moderate canal stenosis. Upper chest: Aortic atherosclerosis and severe emphysema of lung apices. Please refer to concurrent CT of the chest for further characterization of the lungs and mediastinum. Other: None. IMPRESSION: 1. No mass or lymphadenopathy of the neck. 2. Acute on chronic paranasal sinus disease. 3. Please refer to concurrent CT of the chest for further evaluation of lungs and mediastinum. Electronically Signed   By: LKristine GarbeM.D.   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 Previous surgery, chemotherapy, and radiation therapy. EXAM: CT CHEST WITH CONTRAST TECHNIQUE: Multidetector CT imaging of the chest was performed during intravenous contrast administration. CONTRAST:  778mISOVUE-300 IOPAMIDOL (ISOVUE-300) INJECTION 61% COMPARISON:  01/20/2016 FINDINGS: Cardiovascular: No acute findings. Aortic and coronary artery atherosclerosis. Mediastinum/Nodes: Stable 9 mm subcarinal lymph node on image 71/3. 12 mm right hilar lymph node is also stable on image 67/3. No new or increased lymphadenopathy identified. Lungs/Pleura: Moderate to severe emphysema is again demonstrated. Pleural-parenchymal scarring remains stable, most severe in the lingula. Irregular pulmonary nodule in the superior right lower lobe measuring 1.6 x 1.0 cm on image 75/4 has increased in size since previous study, when it measured 1.2 x 0.8 cm. Adjacent subpleural pulmonary nodule in the superior right lower lobe measures 2.3 x 1.7 cm on image 86/4. This is stable since most recent study, but shows gradual increase in size compared to 1.8 x 1.3 cm on earlier exam of 05/13/2015. These nodules are suspicious for bronchogenic carcinoma. A third pleural-based nodule in the posterior right lower lobe measures 9 x 9 cm on image 110/4 and shows no significant change compared with previous  studies. No evidence of pleural effusion. Upper Abdomen: Small renal cystic lesions again seen in visualized portions of upper poles. No evidence of adrenal mass. Musculoskeletal:  No suspicious bone lesions. IMPRESSION: Mild increase in size of 2 adjacent irregular pulmonary nodules in superior right lower lobe, suspicious for bronchogenic carcinoma. Consider PET-CT scan for further evaluation. Stable mild right hilar and mediastinal lymphadenopathy. Stable moderate to severe emphysema and pleural-parenchymal scarring. Aortic and coronary artery atherosclerosis. Electronically Signed  By: Earle Gell M.D.   On: 06/23/2016 16:19   Mm Digital Screening Bilateral  Result Date: 06/17/2016 CLINICAL DATA:  Screening. EXAM: DIGITAL SCREENING BILATERAL MAMMOGRAM WITH CAD COMPARISON:  Previous exam(s). ACR Breast Density Category d: The breast tissue is extremely dense, which lowers the sensitivity of mammography. FINDINGS: There are no findings suspicious for malignancy. Images were processed with CAD. IMPRESSION: No mammographic evidence of malignancy. A result letter of this screening mammogram will be mailed directly to the patient. RECOMMENDATION: Screening mammogram in one year. (Code:SM-B-01Y) BI-RADS CATEGORY  1: Negative. Electronically Signed   By: Curlene Dolphin M.D.   On: 06/17/2016 16:40    Labs No results found for this or any previous visit (from the past 2160 hour(s)).  Assessment/Plan:  Primary cancer of left upper lobe of lung ([place]) Completed therapy and no longer using her port. Would like to have this removed. Risks and benefits of port removal were discussed and she is agreeable to proceed.  Hodgkin's lymphoma ([place]) Completed therapy and no longer using her port. Would like to have this removed. Risks and benefits of port removal were discussed and she is agreeable to proceed.  COPD exacerbation ([place]) stable. On appropriate medications. Her risk would be low for moderate conscious  sedation.  Hyperlipidemia lipid control important in reducing the progression of atherosclerotic disease. Continue statin therapy       Leotis Pain 07/12/2016, 10:14 AM   This note was created with Dragon medical transcription system.  Any errors from dictation are unintentional.

## 2016-07-12 NOTE — Assessment & Plan Note (Signed)
Completed therapy and no longer using her port. Would like to have this removed. Risks and benefits of port removal were discussed and she is agreeable to proceed.

## 2016-07-12 NOTE — Assessment & Plan Note (Signed)
stable. On appropriate medications. Her risk would be low for moderate conscious sedation.

## 2016-07-12 NOTE — Assessment & Plan Note (Signed)
lipid control important in reducing the progression of atherosclerotic disease. Continue statin therapy  

## 2016-07-14 ENCOUNTER — Encounter (INDEPENDENT_AMBULATORY_CARE_PROVIDER_SITE_OTHER): Payer: Self-pay

## 2016-07-14 ENCOUNTER — Other Ambulatory Visit (INDEPENDENT_AMBULATORY_CARE_PROVIDER_SITE_OTHER): Payer: Self-pay

## 2016-07-20 MED ORDER — CEFAZOLIN IN D5W 1 GM/50ML IV SOLN: 1.0000 g | Freq: Once | INTRAVENOUS | Status: DC

## 2016-07-21 ENCOUNTER — Encounter: Payer: Self-pay | Admitting: *Deleted

## 2016-07-21 ENCOUNTER — Ambulatory Visit
Admission: RE | Admit: 2016-07-21 | Discharge: 2016-07-21 | Disposition: A | Discharge status: Home / Self Care | Payer: Medicare Other | Source: Ambulatory Visit | Attending: Vascular Surgery | Admitting: Vascular Surgery

## 2016-07-21 ENCOUNTER — Encounter
Admission: RE | Disposition: A | Discharge status: Home / Self Care | Payer: Self-pay | Source: Ambulatory Visit | Attending: Vascular Surgery

## 2016-07-21 DIAGNOSIS — Z800 Family history of malignant neoplasm of digestive organs: Secondary | ICD-10-CM | POA: Diagnosis not present

## 2016-07-21 DIAGNOSIS — C8111 Nodular sclerosis classical Hodgkin lymphoma, lymph nodes of head, face, and neck: Secondary | ICD-10-CM | POA: Diagnosis not present

## 2016-07-21 DIAGNOSIS — Z8249 Family history of ischemic heart disease and other diseases of the circulatory system: Secondary | ICD-10-CM | POA: Insufficient documentation

## 2016-07-21 DIAGNOSIS — Z833 Family history of diabetes mellitus: Secondary | ICD-10-CM | POA: Diagnosis not present

## 2016-07-21 DIAGNOSIS — K219 Gastro-esophageal reflux disease without esophagitis: Secondary | ICD-10-CM | POA: Diagnosis not present

## 2016-07-21 DIAGNOSIS — Z803 Family history of malignant neoplasm of breast: Secondary | ICD-10-CM | POA: Insufficient documentation

## 2016-07-21 DIAGNOSIS — Z87891 Personal history of nicotine dependence: Secondary | ICD-10-CM | POA: Insufficient documentation

## 2016-07-21 DIAGNOSIS — E785 Hyperlipidemia, unspecified: Secondary | ICD-10-CM | POA: Diagnosis not present

## 2016-07-21 DIAGNOSIS — Z452 Encounter for adjustment and management of vascular access device: Secondary | ICD-10-CM | POA: Insufficient documentation

## 2016-07-21 DIAGNOSIS — J441 Chronic obstructive pulmonary disease with (acute) exacerbation: Secondary | ICD-10-CM | POA: Insufficient documentation

## 2016-07-21 DIAGNOSIS — D649 Anemia, unspecified: Secondary | ICD-10-CM | POA: Diagnosis not present

## 2016-07-21 DIAGNOSIS — Z8042 Family history of malignant neoplasm of prostate: Secondary | ICD-10-CM | POA: Diagnosis not present

## 2016-07-21 DIAGNOSIS — Z888 Allergy status to other drugs, medicaments and biological substances status: Secondary | ICD-10-CM | POA: Diagnosis not present

## 2016-07-21 DIAGNOSIS — C3412 Malignant neoplasm of upper lobe, left bronchus or lung: Secondary | ICD-10-CM | POA: Diagnosis not present

## 2016-07-21 DIAGNOSIS — I10 Essential (primary) hypertension: Secondary | ICD-10-CM | POA: Diagnosis not present

## 2016-07-21 DIAGNOSIS — Z90710 Acquired absence of both cervix and uterus: Secondary | ICD-10-CM | POA: Insufficient documentation

## 2016-07-21 DIAGNOSIS — I780 Hereditary hemorrhagic telangiectasia: Secondary | ICD-10-CM | POA: Diagnosis not present

## 2016-07-21 HISTORY — PX: PORTA CATH REMOVAL: CATH118286

## 2016-07-21 SURGERY — PORTA CATH REMOVAL
Anesthesia: Moderate Sedation

## 2016-07-21 MED ORDER — MIDAZOLAM HCL 2 MG/2ML IJ SOLN
INTRAMUSCULAR | Status: DC | PRN
  Administered 2016-07-21: 1 mg via INTRAVENOUS

## 2016-07-21 MED ORDER — MIDAZOLAM HCL 2 MG/2ML IJ SOLN
INTRAMUSCULAR | Status: AC
  Filled 2016-07-21: qty 4

## 2016-07-21 MED ORDER — FENTANYL CITRATE (PF) 100 MCG/2ML IJ SOLN
INTRAMUSCULAR | Status: DC | PRN
Start: 2016-07-21 — End: 2016-07-21
  Administered 2016-07-21 (×2): 50 ug via INTRAVENOUS

## 2016-07-21 MED ORDER — HEPARIN (PORCINE) IN NACL 2-0.9 UNIT/ML-% IJ SOLN
INTRAMUSCULAR | Status: AC
  Filled 2016-07-21: qty 500

## 2016-07-21 MED ORDER — FENTANYL CITRATE (PF) 100 MCG/2ML IJ SOLN
INTRAMUSCULAR | Status: AC
  Filled 2016-07-21: qty 2

## 2016-07-21 MED ORDER — SODIUM CHLORIDE 0.9 % IV SOLN
INTRAVENOUS | Status: DC
  Administered 2016-07-21: 07:00:00 via INTRAVENOUS

## 2016-07-21 MED ORDER — LIDOCAINE-EPINEPHRINE (PF) 2 %-1:200000 IJ SOLN
INTRAMUSCULAR | Status: AC
  Filled 2016-07-21: qty 20

## 2016-07-21 SURGICAL SUPPLY — 9 items
DERMABOND ADVANCED (GAUZE/BANDAGES/DRESSINGS) ×2
DERMABOND ADVANCED .7 DNX12 (GAUZE/BANDAGES/DRESSINGS) ×1 IMPLANT
ELECT REM PT RETURN 9FT ADLT (ELECTROSURGICAL) ×3
ELECTRODE REM PT RTRN 9FT ADLT (ELECTROSURGICAL) ×1 IMPLANT
PACK ANGIOGRAPHY (CUSTOM PROCEDURE TRAY) ×3 IMPLANT
PENCIL ELECTRO HAND CTR (MISCELLANEOUS) ×3 IMPLANT
SUT MNCRL 4-0 (SUTURE) ×2
SUT MNCRL 4-0 27XMFL (SUTURE) ×1
SUTURE MNCRL 4-0 27XMF (SUTURE) ×1 IMPLANT

## 2016-07-21 NOTE — Discharge Instructions (Signed)
Implanted Port Removal Introduction Implanted port removal is a procedure to remove the port and catheter (port-a-cath) that is implanted under your skin. The port is a small disc under your skin that can be punctured with a needle. It is connected to a vein in your chest or neck by a small flexible tube (catheter). The port-a-cath is used for treatment through an IV tube and for taking blood samples. Your health care provider will remove the port-a-cath if:  You no longer need it for treatment.  It is not working properly.  The area around it gets infected. Tell a health care provider about:  Any allergies you have.  All medicines you are taking, including vitamins, herbs, eye drops, creams, and over-the-counter medicines.  Any problems you or family members have had with anesthetic medicines.  Any blood disorders you have.  Any surgeries you have had.  Any medical conditions you have.  Whether you are pregnant or may be pregnant. What are the risks? Generally, this is a safe procedure. However, problems may occur, including:  Infection.  Bleeding.  Allergic reactions to anesthetic medicines.  Damage to nerves or blood vessels. What happens before the procedure?  You will have:  A physical exam.  Blood tests.  Imaging tests, including a chest X-ray.  Follow instructions from your health care provider about eating or drinking restrictions.  Ask your health care provider about:  Changing or stopping your regular medicines. This is especially important if you are taking diabetes medicines or blood thinners.  Taking medicines such as aspirin and ibuprofen. These medicines can thin your blood. Do not take these medicines before your procedure if your surgeon instructs you not to.  Ask your health care provider how your surgical site will be marked or identified.  You may be given antibiotic medicine to help prevent infection.  Plan to have someone take you home  after the procedure.  If you will be going home right after the procedure, plan to have someone stay with you for 24 hours. What happens during the procedure?  To reduce your risk of infection:  Your health care team will wash or sanitize their hands.  Your skin will be washed with soap.  You may be given one or more of the following:  A medicine to help you relax (sedative).  A medicine to numb the area (local anesthetic).  A small cut (incision) will be made at the site of your port-a-cath.  The port-a-cath and the catheter that has been inside your vein will gently be removed.  The incision will be closed with stitches (sutures), adhesive strips, or skin glue.  A bandage (dressing) will be placed over the incision. The procedure may vary among health care providers and hospitals. What happens after the procedure?  Your blood pressure, heart rate, breathing rate, and blood oxygen level will be monitored often until the medicines you were given have worn off.  Do not drive for 24 hours if you received a sedative. This information is not intended to replace advice given to you by your health care provider. Make sure you discuss any questions you have with your health care provider. Document Released: 04/27/2015 Document Revised: 10/22/2015 Document Reviewed: 02/18/2015  2017 [employer]vier

## 2016-07-21 NOTE — H&P (Signed)
 [facility] History & Physical Update  The patient was interviewed and re-examined.  The patient's previous History and Physical has been reviewed and is unchanged.  There is no change in the plan of care. We plan to proceed with the scheduled procedure.  Leotis Pain, MD  07/21/2016, 8:17 AM

## 2016-07-21 NOTE — Op Note (Signed)
[facility]       Operative Note  Date: 07/21/2016  Preoperative diagnosis:  1. Lung cancer, completed therapy and no longer using port  Postoperative diagnosis:  Same as above  Procedures: #1. Removal of right jugular jugular port a cath   Surgeon: Leotis Pain, MD  Anesthesia: Local with moderate conscious sedation for 15 minutes using 1 mg of Versed and 100 mcg of Fentanyl  Fluoroscopy time: none  Contrast used: 0  Estimated blood loss: Minimal  Indication for the procedure:  The patient is a 76 y.o. female who has completed therapy for lung cancer and no longer needs their Port-A-Cath. The patient desires to have this removed. Risks and benefits including need for potential replacement with recurrent disease were discussed and patient is agreeable to proceed.  Description of procedure: The patient was brought to the vascular and interventional radiology suite. Moderate conscious sedation was administered during a face to face encounter with the patient throughout the procedure with my supervision of the RN administering medicines and monitoring the patient's vital signs, pulse oximetry, telemetry and mental status throughout from the start of the procedure until the patient was taken to the recovery room.  The right neck chest and shoulder were sterilely prepped and draped, and a sterile surgical field was created. The area was then anesthetized with 1% lidocaine copiously. The previous incision was reopened and electrocautery used to dissected down to the port and the catheter. These were dissected free and the catheter was gently removed from the vein in its entirety. The port was dissected out from the fibrous connective tissue and the Prolene sutures were removed. The port was then removed in its entirety including the catheter. The wound was then closed with a 3-0 Vicryl and a 4-0 Monocryl and Dermabond was placed as a  dressing. The patient was then taken to the recovery room in stable condition having tolerated the procedure well.  Complications: none  Condition: stable   Leotis Pain, MD 07/21/2016 8:42 AM   This note was created with Dragon Medical transcription system. Any errors in dictation are purely unintentional.

## 2016-07-22 ENCOUNTER — Encounter: Payer: Self-pay | Admitting: Vascular Surgery

## 2016-08-26 ENCOUNTER — Encounter: Payer: Self-pay | Admitting: *Deleted

## 2016-11-18 ENCOUNTER — Ambulatory Visit: Payer: BC Managed Care – PPO | Admitting: Urology

## 2016-11-24 ENCOUNTER — Ambulatory Visit: Payer: BC Managed Care – PPO | Admitting: Urology

## 2016-11-29 ENCOUNTER — Ambulatory Visit
Admission: RE | Admit: 2016-11-29 | Discharge: 2016-11-29 | Disposition: A | Discharge status: Home / Self Care | Payer: Medicare Other | Source: Ambulatory Visit | Attending: Urology | Admitting: Urology

## 2016-11-29 DIAGNOSIS — N281 Cyst of kidney, acquired: Secondary | ICD-10-CM | POA: Insufficient documentation

## 2016-12-08 ENCOUNTER — Ambulatory Visit (INDEPENDENT_AMBULATORY_CARE_PROVIDER_SITE_OTHER): Payer: Medicare Other | Admitting: Urology

## 2016-12-08 VITALS — BP 132/67 | HR 56 | Ht 65.5 in | Wt 144.0 lb

## 2016-12-08 DIAGNOSIS — N281 Cyst of kidney, acquired: Secondary | ICD-10-CM | POA: Diagnosis not present

## 2016-12-08 NOTE — Progress Notes (Signed)
12/08/2016 2:38 PM   Marie Lowe 13-Feb-1941 [id number]  Referring provider: Marinda Elk, MD [facility] [place], [place] [address]  Chief Complaint  Patient presents with  . Follow-up    1year RUS    HPI:  76 yo F with history of Hodkin's lymphoma and lung cancer followed for numerous bilateral renal cysts, Bosniak I, II, and IIF.  She returns today for annual follow-up renal ultrasound.  Clinically, she is doing very well. She is no longer on chemotherapy for her lung cancer. She is regaining her energy and is quite positive today.  Previous imaging CT abdomen and pelvis with contrast on 10/07/2015, numerous bilateral cysts were identified measuring up to 2.2 cm on the interpolar right kidney.  In addition, there is a more complex left lower pole lesion which has been stable in size since 03/2013. A few of these are more complex likely representing proteinaceous cyst with debris or hemorrhage. There is no obvious solid-appearing or enhancing tumors.   Follow-up renal ultrasound today shows essentially stable renal ultrasounds from previous CT scan. No change over the past year.  She does have baseline renal dysfunction dating back for at least 5 years or more. Baseline creatinine around 1.3 and has remained stable.    She denies any urinary complaints. No flank pain, no gross hematuria. No urinary tract infections. No history of kidney stones.    PMH: Past Medical History:  Diagnosis Date  . Anemia   . Chest pain, unspecified   . COPD (chronic obstructive pulmonary disease) ([place])   . Epistaxis   . Erosive gastropathy   . GERD (gastroesophageal reflux disease)   . High cholesterol   . Hodgkin's lymphoma ([place]) 2014   chemo  . Hodgkin's lymphoma ([place])   . Hypercalcemia   . Hypertension   . Lung cancer ([place]) 2015  . Pneumonia   . 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 Shortness of breath dyspnea     Surgical History: Past Surgical  History:  Procedure Laterality Date  . ABDOMINAL HYSTERECTOMY    . APPENDECTOMY    . COLONOSCOPY WITH PROPOFOL N/A 10/01/2015   Procedure: COLONOSCOPY WITH PROPOFOL;  Surgeon: Manya Silvas, MD;  [facility];  Service: Endoscopy;  Laterality: N/A;  . NASAL SINUS SURGERY    . PORTA CATH REMOVAL N/A 07/21/2016   Procedure: Glori Luis Cath Removal;  Surgeon: Algernon Huxley, MD;  Location: [place] CV LAB;  Service: Cardiovascular;  Laterality: N/A;  . right lower lobe wedge resection      Home Medications:  Allergies as of 12/08/2016      Reactions   Hydrochlorothiazide Other (See Comments)   Elevation of creatinine Patient doesn't remember what reaction she had to it.      Medication List       Accurate as of 12/08/16 11:59 PM. Always use your most recent med list.          ADVAIR DISKUS 100-50 MCG/DOSE Aepb Generic drug:  Fluticasone-Salmeterol Inhale 1 puff into the lungs daily.   albuterol 108 (90 Base) MCG/ACT inhaler Commonly known as:  PROVENTIL HFA;VENTOLIN HFA Inhale 2 puffs into the lungs every 6 (six) hours as needed for wheezing or shortness of breath.   amLODipine-benazepril 10-40 MG capsule Commonly known as:  LOTREL Take 1 capsule by mouth daily.   antiseptic oral rinse Liqd 15 mLs by Mouth Rinse route as needed for dry mouth.   atorvastatin 10 MG tablet Commonly  known as:  LIPITOR Take 10 mg by mouth every morning.   calcium carbonate 1250 (500 Ca) MG tablet Commonly known as:  OS-CAL - dosed in mg of elemental calcium Take 1 tablet by mouth 2 (two) times daily with a meal.   carvedilol 6.25 MG tablet Commonly known as:  COREG Take 6.25 mg by mouth 2 (two) times daily.   MULTIVITAMIN PO Take 1 tablet by mouth daily.   tiotropium 18 MCG inhalation capsule Commonly known as:  SPIRIVA Place 18 mcg into inhaler and inhale daily.   vitamin B-12 1000 MCG tablet Commonly known as:  CYANOCOBALAMIN Take 1,000 mcg by mouth daily.        Allergies:  Allergies  Allergen Reactions  . Hydrochlorothiazide Other (See Comments)    Elevation of creatinine Patient doesn't remember what reaction she had to it.    Family History: Family History  Problem Relation Age of Onset  . Diabetes Other   . CAD Other   . Cancer Other        colon cancer, breast cancer  . Kidney disease Other   . Breast cancer Maternal Aunt   . Prostate cancer Father   . Kidney cancer Neg Hx     Social History:  reports that she has quit smoking. Her smoking use included Cigarettes. She has a 18.00 pack-year smoking history. She has never used smokeless tobacco. She reports that she drinks about 0.6 oz of alcohol per week . She reports that she does not use drugs.  ROS: UROLOGY Frequent Urination?: No Hard to postpone urination?: No Burning/pain with urination?: No Get up at night to urinate?: Yes Leakage of urine?: No Urine stream starts and stops?: Yes Trouble starting stream?: No Do you have to strain to urinate?: No Blood in urine?: No Urinary tract infection?: No Sexually transmitted disease?: No Injury to kidneys or bladder?: No Painful intercourse?: No Weak stream?: No Currently pregnant?: No Vaginal bleeding?: No Last menstrual period?: n  Gastrointestinal Nausea?: No Vomiting?: No Indigestion/heartburn?: No Diarrhea?: No Constipation?: Yes  Constitutional Fever: No Night sweats?: No Weight loss?: No Fatigue?: No  Skin Skin rash/lesions?: No Itching?: No  Eyes Blurred vision?: No Double vision?: No  Ears/Nose/Throat Sore throat?: No Sinus problems?: Yes  Hematologic/Lymphatic Swollen glands?: No Easy bruising?: No  Cardiovascular Leg swelling?: No Chest pain?: No  Respiratory Cough?: Yes Shortness of breath?: Yes  Endocrine Excessive thirst?: No  Musculoskeletal Back pain?: No Joint pain?: No  Neurological Headaches?: No Dizziness?: No  Psychologic Depression?: No Anxiety?:  No  Physical Exam: BP 132/67 (BP Location: Left Arm, Patient Position: Sitting, Cuff Size: Normal)   Pulse (!) 56   Ht 5' 5.5" (1.664 m)   Wt 144 lb (65.3 kg)   BMI 23.60 kg/m   Constitutional:  Alert and oriented, No acute distress.  Well dressed.   HEENT: [place] AT, moist mucus membranes.  Trachea midline, no masses. Cardiovascular: No clubbing, cyanosis, or edema. Respiratory: Normal respiratory effort, no increased work of breathing. GI: Abdomen is soft, nontender, nondistended, no abdominal masses GU: No CVA tenderness.  Skin: No rashes, bruises or suspicious lesions. Neurologic: Grossly intact, no focal deficits, moving all 4 extremities. Psychiatric: Normal mood and affect.  Laboratory Data: Lab Results  Component Value Date   WBC 2.1 (L) 11/16/2015   HGB 12.1 11/16/2015   HCT 35.7 11/16/2015   MCV 90.7 11/16/2015   PLT 129 (L) 11/16/2015    Lab Results  Component Value Date   CREATININE  1.40 (H) 01/20/2016     Pertinent Imaging: CLINICAL DATA:  Follow-up renal cysts  EXAM: RENAL / URINARY TRACT ULTRASOUND COMPLETE  COMPARISON:  10/07/2015  FINDINGS: Right Kidney:  Length: 10.2 cm. Multiple cysts are noted similar to that seen on prior CT examination. Largest of these lies in the lower pole measuring 2.6 cm. Some internal septations are noted similar to that noted on prior CT examination. The other visualized cysts are simple in nature.  Left Kidney:  Length: 11.3 cm. Dominant 3.6 cm cyst is noted similar to that seen on prior CT examination. This lies along the medial aspect of the lower pole. Smaller simple cysts are noted.  Bladder:  Appears normal for degree of bladder distention.  IMPRESSION: Bilateral cysts overall stable in appearance from the prior CT.   Electronically Signed   By: Inez Catalina M.D.   On: 11/29/2016 15:58  Renal ultrasound today personally. This was personally compared to previous CT abdomen pelvis from  09/2015.  Assessment & Plan:    1. Renal cyst Multiple bilateral cysts with varying complexity stable since at least 2014  For cystic renal masses, we reviewed the Bosniak classification and discussed that Bosniak 3 lesions harbor a 50% chance of malignancy whereas Bosniak 4 cysts have a solid and 90-95% are malignant in nature.   These lesions are consistent with Bosniak I, II, and IIF and stable from 1 year ago.    Patient is interested in having repeat renal ultrasound in one year.      2. CKD (chronic kidney disease), stage 3 (moderate) Stable renal fx for ~6 years Not followed by nephrology but Cr stable, most recent 1.2  Return in about 1 year (around 12/08/2017) for RUS.  Hollice Espy, MD  [facility] [address] [place], [place] [address] [phone]

## 2016-12-23 ENCOUNTER — Ambulatory Visit
Admission: RE | Admit: 2016-12-23 | Discharge: 2016-12-23 | Disposition: A | Discharge status: Home / Self Care | Payer: Medicare Other | Source: Ambulatory Visit | Attending: Oncology | Admitting: Oncology

## 2016-12-23 DIAGNOSIS — C3412 Malignant neoplasm of upper lobe, left bronchus or lung: Secondary | ICD-10-CM | POA: Insufficient documentation

## 2016-12-23 DIAGNOSIS — C8111 Nodular sclerosis classical Hodgkin lymphoma, lymph nodes of head, face, and neck: Secondary | ICD-10-CM | POA: Diagnosis not present

## 2016-12-23 DIAGNOSIS — I700 Atherosclerosis of aorta: Secondary | ICD-10-CM | POA: Diagnosis not present

## 2016-12-23 DIAGNOSIS — R911 Solitary pulmonary nodule: Secondary | ICD-10-CM | POA: Insufficient documentation

## 2016-12-23 DIAGNOSIS — J320 Chronic maxillary sinusitis: Secondary | ICD-10-CM | POA: Diagnosis not present

## 2016-12-23 DIAGNOSIS — J439 Emphysema, unspecified: Secondary | ICD-10-CM | POA: Diagnosis not present

## 2016-12-23 DIAGNOSIS — N289 Disorder of kidney and ureter, unspecified: Secondary | ICD-10-CM | POA: Insufficient documentation

## 2016-12-23 MED ORDER — IOPAMIDOL (ISOVUE-300) INJECTION 61%
75.0000 mL | Freq: Once | INTRAVENOUS | Status: AC | PRN
  Administered 2016-12-23: 75 mL via INTRAVENOUS

## 2016-12-26 ENCOUNTER — Ambulatory Visit: Payer: BC Managed Care – PPO | Admitting: Oncology

## 2016-12-30 ENCOUNTER — Ambulatory Visit: Payer: BC Managed Care – PPO | Admitting: Radiation Oncology

## 2017-01-02 ENCOUNTER — Ambulatory Visit
Admission: RE | Admit: 2017-01-02 | Discharge: 2017-01-02 | Disposition: A | Discharge status: Home / Self Care | Payer: Medicare Other | Source: Ambulatory Visit | Attending: Radiation Oncology | Admitting: Radiation Oncology

## 2017-01-02 ENCOUNTER — Ambulatory Visit: Payer: BC Managed Care – PPO | Admitting: Radiation Oncology

## 2017-01-02 ENCOUNTER — Telehealth: Payer: Self-pay | Admitting: *Deleted

## 2017-01-02 ENCOUNTER — Inpatient Hospital Stay: Payer: Medicare Other | Attending: Oncology | Admitting: Hematology and Oncology

## 2017-01-02 ENCOUNTER — Ambulatory Visit: Payer: BC Managed Care – PPO | Admitting: Oncology

## 2017-01-02 ENCOUNTER — Encounter: Payer: Self-pay | Admitting: Hematology and Oncology

## 2017-01-02 ENCOUNTER — Encounter: Payer: Self-pay | Admitting: Radiation Oncology

## 2017-01-02 ENCOUNTER — Other Ambulatory Visit: Payer: Self-pay | Admitting: *Deleted

## 2017-01-02 ENCOUNTER — Inpatient Hospital Stay: Payer: Medicare Other

## 2017-01-02 VITALS — BP 151/74 | HR 86 | Temp 96.7°F | Resp 18 | Wt 146.4 lb

## 2017-01-02 VITALS — BP 149/76 | HR 90 | Temp 97.4°F | Wt 146.6 lb

## 2017-01-02 DIAGNOSIS — Z803 Family history of malignant neoplasm of breast: Secondary | ICD-10-CM | POA: Diagnosis not present

## 2017-01-02 DIAGNOSIS — E7800 Pure hypercholesterolemia, unspecified: Secondary | ICD-10-CM | POA: Insufficient documentation

## 2017-01-02 DIAGNOSIS — J449 Chronic obstructive pulmonary disease, unspecified: Secondary | ICD-10-CM | POA: Diagnosis not present

## 2017-01-02 DIAGNOSIS — Z9221 Personal history of antineoplastic chemotherapy: Secondary | ICD-10-CM | POA: Insufficient documentation

## 2017-01-02 DIAGNOSIS — Z87891 Personal history of nicotine dependence: Secondary | ICD-10-CM | POA: Insufficient documentation

## 2017-01-02 DIAGNOSIS — Z923 Personal history of irradiation: Secondary | ICD-10-CM | POA: Insufficient documentation

## 2017-01-02 DIAGNOSIS — D709 Neutropenia, unspecified: Secondary | ICD-10-CM | POA: Diagnosis not present

## 2017-01-02 DIAGNOSIS — D61818 Other pancytopenia: Secondary | ICD-10-CM | POA: Diagnosis not present

## 2017-01-02 DIAGNOSIS — C3412 Malignant neoplasm of upper lobe, left bronchus or lung: Secondary | ICD-10-CM

## 2017-01-02 DIAGNOSIS — Z8042 Family history of malignant neoplasm of prostate: Secondary | ICD-10-CM | POA: Insufficient documentation

## 2017-01-02 DIAGNOSIS — K219 Gastro-esophageal reflux disease without esophagitis: Secondary | ICD-10-CM | POA: Insufficient documentation

## 2017-01-02 DIAGNOSIS — R918 Other nonspecific abnormal finding of lung field: Secondary | ICD-10-CM | POA: Diagnosis not present

## 2017-01-02 DIAGNOSIS — Z8571 Personal history of Hodgkin lymphoma: Secondary | ICD-10-CM | POA: Diagnosis not present

## 2017-01-02 DIAGNOSIS — Z800 Family history of malignant neoplasm of digestive organs: Secondary | ICD-10-CM | POA: Diagnosis not present

## 2017-01-02 DIAGNOSIS — R748 Abnormal levels of other serum enzymes: Secondary | ICD-10-CM | POA: Diagnosis not present

## 2017-01-02 DIAGNOSIS — C8111 Nodular sclerosis classical Hodgkin lymphoma, lymph nodes of head, face, and neck: Secondary | ICD-10-CM

## 2017-01-02 DIAGNOSIS — I10 Essential (primary) hypertension: Secondary | ICD-10-CM | POA: Insufficient documentation

## 2017-01-02 DIAGNOSIS — Z79899 Other long term (current) drug therapy: Secondary | ICD-10-CM | POA: Diagnosis not present

## 2017-01-02 LAB — COMPREHENSIVE METABOLIC PANEL
ALT: 34 U/L (ref 14–54)
AST: 53 U/L — ABNORMAL HIGH (ref 15–41)
Albumin: 4 g/dL (ref 3.5–5.0)
Alkaline Phosphatase: 322 U/L — ABNORMAL HIGH (ref 38–126)
Anion gap: 7 (ref 5–15)
BUN: 24 mg/dL — ABNORMAL HIGH (ref 6–20)
CO2: 27 mmol/L (ref 22–32)
Calcium: 10 mg/dL (ref 8.9–10.3)
Chloride: 104 mmol/L (ref 101–111)
Creatinine, Ser: 1.1 mg/dL — ABNORMAL HIGH (ref 0.44–1.00)
GFR calc Af Amer: 55 mL/min — ABNORMAL LOW (ref >60)
GFR calc non Af Amer: 48 mL/min — ABNORMAL LOW (ref >60)
Glucose, Bld: 89 mg/dL (ref 65–99)
Potassium: 4.2 mmol/L (ref 3.5–5.1)
Sodium: 138 mmol/L (ref 135–145)
Total Bilirubin: 1 mg/dL (ref 0.3–1.2)
Total Protein: 7.7 g/dL (ref 6.5–8.1)

## 2017-01-02 LAB — CBC WITH DIFFERENTIAL/PLATELET
Basophils Absolute: 0 10*3/uL (ref 0–0.1)
Basophils Relative: 1 %
Eosinophils Absolute: 0.2 10*3/uL (ref 0–0.7)
Eosinophils Relative: 6 %
HCT: 36.6 % (ref 35.0–47.0)
Hemoglobin: 12.4 g/dL (ref 12.0–16.0)
Lymphocytes Relative: 46 %
Lymphs Abs: 1.3 10*3/uL (ref 1.0–3.6)
MCH: 32.1 pg (ref 26.0–34.0)
MCHC: 33.9 g/dL (ref 32.0–36.0)
MCV: 94.6 fL (ref 80.0–100.0)
Monocytes Absolute: 0.6 10*3/uL (ref 0.2–0.9)
Monocytes Relative: 24 %
Neutro Abs: 0.6 10*3/uL — ABNORMAL LOW (ref 1.4–6.5)
Neutrophils Relative %: 23 %
Platelets: 133 10*3/uL — ABNORMAL LOW (ref 150–440)
RBC: 3.87 MIL/uL (ref 3.80–5.20)
RDW: 17 % — ABNORMAL HIGH (ref 11.5–14.5)
WBC: 2.7 10*3/uL — ABNORMAL LOW (ref 3.6–11.0)

## 2017-01-02 NOTE — Telephone Encounter (Signed)
Called patient and left message about neutropenic precautions and to call with questions.

## 2017-01-02 NOTE — Progress Notes (Signed)
Radiation Oncology Follow up Note  Name: Marie Lowe   Date:   01/02/2017 MRN:  [id number] DOB: Nov 03, 1940    This 76 y.o. 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  REFERRING PROVIDER: Marinda Elk, MD  HPI: patient is a 76 year old female now seen out 2 years having completed SB RT to her left upper lobe for stage I squamous cell carcinoma. We have been following 2 adjacent nodules in the superior segment of the right lower lobe. PET CT scan was ordered although patient did not have that test done..recent CT scandoes show mild progression of 2 spiculated masses in the right lower lobe worrisome for bronchogenic carcinoma. Left lung scarring is stable with no evidence of progressive adenopathy in her chest. She does have a slight nonproductive cough.she also has a history of Hodgkin's lymphoma which is in remission.  COMPLICATIONS OF TREATMENT: none  FOLLOW UP COMPLIANCE: keeps appointments   PHYSICAL EXAM:  BP (!) 149/76   Pulse 90   Temp (!) 97.4 F (36.3 C)   Wt 146 lb 9.7 oz (66.5 kg)   BMI 24.03 kg/m  Well-developed well-nourished patient in NAD. HEENT reveals PERLA, EOMI, discs not visualized.  Oral cavity is clear. No oral mucosal lesions are identified. Neck is clear without evidence of cervical or supraclavicular adenopathy. Lungs are clear to A&P. Cardiac examination is essentially unremarkable with regular rate and rhythm without murmur rub or thrill. Abdomen is benign with no organomegaly or masses noted. Motor sensory and DTR levels are equal and symmetric in the upper and lower extremities. Cranial nerves II through XII are grossly intact. Proprioception is intact. No peripheral adenopathy or edema is identified. No motor or sensory levels are noted. Crude visual fields are within normal range.  RADIOLOGY RESULTS: CT scans are reviewed compatible above-stated findings. I  have reordered's PET CT scan  PLAN: present time I like to review her PET/CT scan have set up a follow-up appointment shortly after her PET/CT is completed. She does be my hypermetabolic lesions in her right lower lobe female order CT-guided biopsy. Patient is aware of my recommendation she is not to encouraged to seek treatment at this time although will make further recommendations upon review of her PET/CT scan. She has an appointment with medical oncology this afternoon.  I would like to take this opportunity to thank you for allowing me to participate in the care of your patient.Armstead Peaks., MD

## 2017-01-02 NOTE — Progress Notes (Signed)
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  INTERVAL HISTORY: The patient was last seen by Dr. Grayland Ormond on 06/27/2016.  At that time, she felt well.  She denied any respiratory symptoms or B symptoms.  Decision was made for follow-up imaging.  Neck and chest CT on 12/23/2016 revealed progressive enlargement of two spiculated masses in the right lower lobe (2.4 x 2 cm compared to 1.0 x 1.6 cm; 2.6 x 3.0 cm compared to 1.7 x 2.3 cm), highly worrisome for bronchogenic carcinoma. Tissue sampling was recommended if not previously performed. Follow-up PET-CT may be helpful for further staging.  There was no evidence of left lung recurrence.  There was stable small right hilar and subcarinal lymph nodes and no progressive adenopathy.  There was stable appearance of the visualized upper abdomen.   The patient was seen by Dr. Baruch Gouty today. She has been set up for a PET scan.  Symptomatically, she feels "ok".  She denies any symptoms except for shortness of breath with exertion.   REVIEW OF SYSTEMS:   Review of Systems  Constitutional: Negative.  Negative for fever, malaise/fatigue and weight loss.  HENT: Negative.   Eyes: Negative.   Respiratory: Positive for shortness of breath. Negative for cough and hemoptysis.        Shortness of breath with exertion.  Cardiovascular: Negative.  Negative for chest pain and leg swelling.  Gastrointestinal: Negative for abdominal pain, blood in stool, constipation, diarrhea, melena, nausea and vomiting.  Genitourinary: Negative.   Musculoskeletal: Negative.   Skin: Negative.   Neurological: Negative.  Negative for weakness.    Endo/Heme/Allergies: Negative.   Psychiatric/Behavioral: Negative.     As per HPI. Otherwise, a complete review of systems is negative.  PAST MEDICAL HISTORY: Past Medical History:  Diagnosis Date  . Anemia   . Chest pain, unspecified   . COPD (chronic obstructive pulmonary disease) ([place])   . Epistaxis   . Erosive gastropathy   . GERD (gastroesophageal reflux disease)   . High cholesterol   . Hodgkin's lymphoma ([place]) 2014   chemo  . Hodgkin's lymphoma ([place])   . Hypercalcemia   . Hypertension   . Lung cancer ([place]) 2015  . Pneumonia   . 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 Shortness of breath dyspnea     PAST SURGICAL HISTORY: Past Surgical History:  Procedure Laterality Date  . ABDOMINAL HYSTERECTOMY    . APPENDECTOMY    . COLONOSCOPY WITH PROPOFOL N/A 10/01/2015   Procedure: COLONOSCOPY WITH PROPOFOL;  Surgeon: Manya Silvas, MD;  Location: [facility] ENDOSCOPY;  Service: Endoscopy;  Laterality: N/A;  . NASAL SINUS SURGERY    . PORTA CATH REMOVAL N/A 07/21/2016   Procedure: Glori Luis Cath Removal;  Surgeon: Algernon Huxley, MD;  Location: [facility] CV LAB;  Service: Cardiovascular;  Laterality: N/A;  . right lower lobe wedge resection      FAMILY HISTORY Family History  Problem Relation Age of Onset  . Diabetes Other   . CAD Other   . Cancer Other        colon cancer, breast cancer  . Kidney disease Other   . Breast cancer Maternal Aunt   . Prostate cancer Father   . Kidney cancer  Neg Hx        ADVANCED DIRECTIVES:    HEALTH MAINTENANCE: Social History  Substance Use Topics  . Smoking status: Former Smoker    Packs/day: 1.00    Years: 18.00    Types: Cigarettes  . Smokeless tobacco: Never Used  . Alcohol use 0.6 oz/week    1 Glasses of wine per week     Comment: Occassionaly     Colonoscopy:  PAP:  Bone density:  Lipid panel:  Allergies  Allergen Reactions  . Hydrochlorothiazide Other (See Comments)    Elevation of creatinine Patient doesn't  remember what reaction she had to it.    Current Outpatient Prescriptions  Medication Sig Dispense Refill  . albuterol (PROVENTIL HFA;VENTOLIN HFA) 108 (90 BASE) MCG/ACT inhaler Inhale 2 puffs into the lungs every 6 (six) hours as needed for wheezing or shortness of breath.    Marland Kitchen amLODipine-benazepril (LOTREL) 10-40 MG per capsule Take 1 capsule by mouth daily.  3  . antiseptic oral rinse (BIOTENE) LIQD 15 mLs by Mouth Rinse route as needed for dry mouth.    Marland Kitchen atorvastatin (LIPITOR) 10 MG tablet Take 10 mg by mouth every morning.     . calcium carbonate (OS-CAL - DOSED IN MG OF ELEMENTAL CALCIUM) 1250 (500 Ca) MG tablet Take 1 tablet by mouth 2 (two) times daily with a meal.     . carvedilol (COREG) 6.25 MG tablet Take 6.25 mg by mouth 2 (two) times daily.  5  . Fluticasone-Salmeterol (ADVAIR DISKUS) 100-50 MCG/DOSE AEPB Inhale 1 puff into the lungs daily.     . Multiple Vitamins-Minerals (MULTIVITAMIN PO) Take 1 tablet by mouth daily.     Marland Kitchen tiotropium (SPIRIVA) 18 MCG inhalation capsule Place 18 mcg into inhaler and inhale daily.    . vitamin B-12 (CYANOCOBALAMIN) 1000 MCG tablet Take 1,000 mcg by mouth daily.     Current Facility-Administered Medications  Medication Dose Route Frequency Provider Last Rate Last Dose  . ceFAZolin (ANCEF) IVPB 1 g/50 mL premix  1 g Intravenous Once Algernon Huxley, MD       Facility-Administered Medications Ordered in Other Visits  Medication Dose Route Frequency Provider Last Rate Last Dose  . heparin lock flush 100 unit/mL  500 Units Intravenous Once Lloyd Huger, MD      . sodium chloride 0.9 % injection 10 mL  10 mL Intravenous PRN Lloyd Huger, MD   10 mL at 02/09/15 0844    OBJECTIVE: Vitals:   01/02/17 1130  BP: (!) 151/74  Pulse: 86  Resp: 18  Temp: (!) 96.7 F (35.9 C)     Body mass index is 24 kg/m.    ECOG FS:0 - Asymptomatic  GENERAL:  Well developed, well nourished, woman sitting comfortably in the exam room in no acute  distress.  She is accompanied by her husband. MENTAL STATUS:  Alert and oriented to person, place and time. HEAD:  Curly gray hair.  Normocephalic, atraumatic, face symmetric, no Cushingoid features. EYES:  Glasses.  Brown eyes.  Pupils equal round and reactive to light and accomodation.  No conjunctivitis or scleral icterus. ENT:  Oropharynx clear without lesion.  Tongue normal. Mucous membranes moist.  RESPIRATORY:  Clear to auscultation without rales, wheezes or rhonchi. CARDIOVASCULAR:  Regular rate and rhythm without murmur, rub or gallop. ABDOMEN:  Soft, non-tender, with active bowel sounds, and no hepatosplenomegaly.  No masses. SKIN:  No rashes, ulcers or lesions. EXTREMITIES: No edema, no skin discoloration or tenderness.  No palpable cords. LYMPH NODES: No palpable cervical, supraclavicular, axillary or inguinal adenopathy  NEUROLOGICAL: Unremarkable. PSYCH:  Appropriate.   LAB RESULTS:  Lab Results  Component Value Date   NA 138 11/16/2015   K 3.9 11/16/2015   CL 105 11/16/2015   CO2 28 11/16/2015   GLUCOSE 86 11/16/2015   BUN 23 (H) 11/16/2015   CREATININE 1.40 (H) 01/20/2016   CALCIUM 10.8 (H) 11/16/2015   PROT 7.4 02/09/2015   ALBUMIN 3.3 (L) 02/09/2015   AST 51 (H) 02/09/2015   ALT 39 02/09/2015   ALKPHOS 380 (H) 02/09/2015   BILITOT 1.5 (H) 02/09/2015   GFRNONAA 39 (L) 11/16/2015   GFRAA 46 (L) 11/16/2015    Lab Results  Component Value Date   WBC 2.1 (L) 11/16/2015   NEUTROABS 0.5 (L) 11/16/2015   HGB 12.1 11/16/2015   HCT 35.7 11/16/2015   MCV 90.7 11/16/2015   PLT 129 (L) 11/16/2015     STUDIES: Ct Soft Tissue Neck W Contrast  Result Date: 12/23/2016 CLINICAL DATA:  Personal history of Hodgkin's lymphoma. Status post radiation and chemotherapy. Subsequent treatment strategy. EXAM: CT NECK WITH CONTRAST TECHNIQUE: Multidetector CT imaging of the neck was performed using the standard protocol following the bolus administration of intravenous  contrast. CONTRAST:  74mL ISOVUE-300 IOPAMIDOL (ISOVUE-300) INJECTION 61% COMPARISON:  CT the neck with contrast 06/23/2016 FINDINGS: Pharynx and larynx: No focal mucosal or submucosal lesions are present. The nasopharynx, oropharynx, and hypopharynx are clear. The vocal cords are midline and symmetric. The larynx is unremarkable. Salivary glands: The salivary glands are mildly hyperdense, compatible with prior radiation. No focal lesions are present. Thyroid: Normal Lymph nodes: No significant residual or recurrent cervical adenopathy is present. Vascular: Atherosclerotic calcifications are present at the origin the great vessels at the aortic arch. Bilateral carotid bifurcation calcifications are present. There is no significant stenosis. Limited intracranial: Within normal limits. Visualized orbits: Negative. Mastoids and visualized paranasal sinuses: Extensive mucosal disease is present in the maxillary sinuses bilaterally with marked wall thickening. The paranasal sinuses are otherwise clear. A fluid level is present in the left maxillary sinus. Skeleton: Degenerative changes of the cervical spine are similar to the prior study. Osseous foraminal narrowing is greatest at C3-4, left greater than right. Solid anterior fusion is present at C6-7. Upper chest: Severe emphysematous changes are again noted. IMPRESSION: 1. No residual recurrent adenopathy in the neck to suggest recurrent lymphoma. 2. Acute on chronic maxillary sinus disease. 3. No significant interval change. 4. Aortic Atherosclerosis (ICD10-I70.0) and Emphysema (ICD10-J43.9). Electronically Signed   By: San Morelle M.D.   On: 12/23/2016 10:39   Ct Chest W Contrast  Result Date: 12/23/2016 CLINICAL DATA:  Follow up left upper lobe squamous cell lung cancer and Hodgkin's lymphoma. Radiation and chemotherapy completed. No current complaints. EXAM: CT CHEST WITH CONTRAST TECHNIQUE: Multidetector CT imaging of the chest was performed during  intravenous contrast administration. CONTRAST:  82mL ISOVUE-300 IOPAMIDOL (ISOVUE-300) INJECTION 61% COMPARISON:  Chest CT 06/23/2016 and 01/20/2016. Abdominal CT 10/07/2015. FINDINGS: Cardiovascular: There is aortic, great vessel and coronary artery atherosclerosis. No acute vascular findings are demonstrated the heart size is stable at the upper limits of normal. There is a small pericardial effusion which is unchanged. Mediastinum/Nodes: Stable 9 mm subcarinal node on image 31 and 12 mm right hilar node on image 28. No new or enlarging mediastinal, hilar or axillary lymph nodes. The thyroid gland, trachea and esophagus demonstrate no significant findings. Lungs/Pleura: There is no pleural effusion. Again demonstrated  is severe emphysema with scattered subpleural reticulation. Spiculated masses in the superior segment of the right lower lobe demonstrate progressive enlargement. A component measuring 2.4 x 2.0 cm on image 31 previously measured 1.0 x 1.6 cm. Partially calcified subpleural component measuring 2.6 x 3.0 cm on image 35 previously measured 1.7 x 2.3 cm. Nodularity more inferiorly in the right lower lobe appears stable, probably scarring. There is also probable scarring in the lingula and anterior left lower lobe which appears unchanged. This cyst at the site of previously demonstrated tumor. No tumor recurrence identified in this area. Upper abdomen: The visualized upper abdomen appears grossly stable without highly suspicious findings. There is a probable transient hepatic attenuation difference peripherally in the right hepatic (image 58). No adrenal mass. There are multiple simple and complex renal cysts bilaterally, grossly stable. Musculoskeletal/Chest wall: There is no chest wall mass or suspicious osseous finding. IMPRESSION: 1. Progressive enlargement of two spiculated masses in the right lower lobe, highly worrisome for bronchogenic carcinoma. Tissue sampling recommended if not previously  performed. Follow-up PET-CT may be helpful for further staging. 2. No evidence of left lung recurrence. 3. Stable small right hilar and subcarinal lymph nodes. No progressive adenopathy. 4. Stable appearance of the visualized upper abdomen. There are complex renal lesions which are grossly unchanged. Electronically Signed   By: Richardean Sale M.D.   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  PLAN:    1.  Hodgkin's lymphoma: No evidence of disease.  Patient was initially diagnosed in February 2013. Previously noted pulmonary nodule consistent with lung cancer.  Continue to monitor routinely with CT scans.  2.  Left upper lobe lung cancer:  Patient completed XRT and chemotherapy to the left upper lobe lung nodule.  Biopsy confirmed squamous cell carcinoma.   Case was previously discussed at cancer conference and a recommendation was to continue to monitor with periodic CT scans. Left lung lesion stable without evidence of recurrence.  Previously, patient refused any further surgery or chemotherapy.   3. Right lower lobe nodule:  Chest CT on 12/23/2016 revealed progressive enlargement of two spiculated masses in the right lower lobe.  Discuss the plan for follow-up PET scan and consideration of biopsy.    4.  Pancytopenia: Patient's WBC count and platelet counts were all decreased. This was possibly related to myelosuppression from previous chemotherapy from Hodgkin's.  CBC today reveals slight improvement.  She remains neutropenic.  Patient contacted regarding neutropenic precautions.   5. Hypercalcemia: Unclear etiology, Monitor.  Calcium today is 10.0.  6. Increased alkaline phosphatase:  Etiology unclear.  Review of labs dating back to 03/13/2014 reveal persistent elevation.  AST minimally elevated.  Assess bone and liver with upcoming PET scan.  Patient expressed understanding and was  in agreement with this plan. She also understands that She can call clinic at any time with any questions, concerns, or complaints.    Lequita Asal, MD   01/02/2017 12:09 PM

## 2017-01-02 NOTE — Progress Notes (Signed)
Patient here today for CT results.  Patient of Dr. Grayland Ormond.

## 2017-01-08 DIAGNOSIS — D709 Neutropenia, unspecified: Secondary | ICD-10-CM | POA: Insufficient documentation

## 2017-01-08 DIAGNOSIS — R748 Abnormal levels of other serum enzymes: Secondary | ICD-10-CM | POA: Insufficient documentation

## 2017-01-23 ENCOUNTER — Ambulatory Visit
Admission: RE | Admit: 2017-01-23 | Discharge: 2017-01-23 | Disposition: A | Discharge status: Home / Self Care | Payer: Medicare Other | Source: Ambulatory Visit | Attending: Radiation Oncology | Admitting: Radiation Oncology

## 2017-01-23 DIAGNOSIS — I700 Atherosclerosis of aorta: Secondary | ICD-10-CM | POA: Diagnosis not present

## 2017-01-23 DIAGNOSIS — R918 Other nonspecific abnormal finding of lung field: Secondary | ICD-10-CM | POA: Insufficient documentation

## 2017-01-23 DIAGNOSIS — R590 Localized enlarged lymph nodes: Secondary | ICD-10-CM | POA: Insufficient documentation

## 2017-01-23 DIAGNOSIS — I2510 Atherosclerotic heart disease of native coronary artery without angina pectoris: Secondary | ICD-10-CM | POA: Diagnosis not present

## 2017-01-23 DIAGNOSIS — C3412 Malignant neoplasm of upper lobe, left bronchus or lung: Secondary | ICD-10-CM | POA: Diagnosis not present

## 2017-01-23 DIAGNOSIS — N289 Disorder of kidney and ureter, unspecified: Secondary | ICD-10-CM | POA: Diagnosis not present

## 2017-01-23 LAB — GLUCOSE, CAPILLARY: GLUCOSE-CAPILLARY: 74 mg/dL (ref 65–99)

## 2017-01-23 MED ORDER — FLUDEOXYGLUCOSE F - 18 (FDG) INJECTION
13.5200 | Freq: Once | INTRAVENOUS | Status: AC | PRN
  Administered 2017-01-23: 13.52 via INTRAVENOUS

## 2017-02-03 NOTE — Progress Notes (Signed)
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  INTERVAL HISTORY: Patient returns to clinic today for routine evaluation and discussion of her PET scan results. She continues to feel well and remains asymptomatic. She has occasional constipation. She has a good appetite and her weight is stable. She denies any fevers or night sweats. She has no neurologic complaints.  She denies any nausea, vomiting, or diarrhea. She has no chest pain, cough, or shortness of breath. She has no urinary complaints.  Patient offers no further specific complaints today.  REVIEW OF SYSTEMS:   Review of Systems  Constitutional: Negative.  Negative for fever and malaise/fatigue.  Respiratory: Negative for cough, hemoptysis and shortness of breath.   Cardiovascular: Negative.  Negative for chest pain and leg swelling.  Gastrointestinal: Positive for constipation. Negative for abdominal pain.  Genitourinary: Negative.   Musculoskeletal: Negative.   Skin: Negative.  Negative for rash.  Neurological: Negative.  Negative for sensory change, focal weakness and weakness.  Endo/Heme/Allergies: Negative.   Psychiatric/Behavioral: Negative.  Negative for memory loss. The patient is not nervous/anxious.     As per HPI. Otherwise, a complete review of systems is negative.  PAST MEDICAL HISTORY: Past Medical History:  Diagnosis Date  . Anemia   . Chest pain, unspecified   . COPD (chronic obstructive pulmonary disease) ([place])   . Epistaxis   . Erosive gastropathy   . GERD (gastroesophageal reflux disease)   . High cholesterol   . Hodgkin's lymphoma ([place]) 2014   chemo  . Hodgkin's lymphoma ([place])   .  Hypercalcemia   . Hypertension   . Lung cancer ([place]) 2015  . Pneumonia   . 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 Shortness of breath dyspnea     PAST SURGICAL HISTORY: Past Surgical History:  Procedure Laterality Date  . ABDOMINAL HYSTERECTOMY    . APPENDECTOMY    . COLONOSCOPY WITH PROPOFOL N/A 10/01/2015   Procedure: COLONOSCOPY WITH PROPOFOL;  Surgeon: Manya Silvas, MD;  Location: [facility];  Service: Endoscopy;  Laterality: N/A;  . NASAL SINUS SURGERY    . PORTA CATH REMOVAL N/A 07/21/2016   Procedure: Glori Luis Cath Removal;  Surgeon: Algernon Huxley, MD;  Location: [facility];  Service: Cardiovascular;  Laterality: N/A;  . right lower lobe wedge resection      FAMILY HISTORY Family History  Problem Relation Age of Onset  . Diabetes Other   . CAD Other   . Cancer Other        colon cancer, breast cancer  . Kidney disease Other   . Breast cancer Maternal Aunt   . Prostate cancer Father   . Kidney cancer Neg Hx        ADVANCED DIRECTIVES:    HEALTH MAINTENANCE: Social History  Substance Use Topics  . Smoking status: Former Smoker    Packs/day: 1.00    Years: 18.00    Types: Cigarettes  . Smokeless tobacco: Never Used  . Alcohol use 0.6 oz/week    1 Glasses of wine per week     Comment: Occassionaly     Colonoscopy:  PAP:  Bone density:  Lipid panel:  Allergies  Allergen Reactions  . Hydrochlorothiazide Other (See Comments)  Elevation of creatinine Patient doesn't remember what reaction she had to it.    Current Outpatient Prescriptions  Medication Sig Dispense Refill  . ADVAIR HFA 115-21 MCG/ACT inhaler     . albuterol (PROVENTIL HFA;VENTOLIN HFA) 108 (90 BASE) MCG/ACT inhaler Inhale 2 puffs into the lungs every 6 (six) hours as needed for wheezing or shortness of breath.    Marland Kitchen amLODipine-benazepril (LOTREL) 5-20 MG capsule Take 1 capsule by mouth daily.  3  . antiseptic oral rinse (BIOTENE) LIQD 15 mLs by Mouth Rinse route as needed for  dry mouth.    Marland Kitchen atorvastatin (LIPITOR) 10 MG tablet Take 10 mg by mouth every morning.     . calcium carbonate (OS-CAL - DOSED IN MG OF ELEMENTAL CALCIUM) 1250 (500 Ca) MG tablet Take 1 tablet by mouth 2 (two) times daily with a meal.     . carvedilol (COREG) 6.25 MG tablet Take 6.25 mg by mouth 2 (two) times daily.  5  . mirtazapine (REMERON) 15 MG tablet Take by mouth.    . Multiple Vitamins-Minerals (MULTIVITAMIN PO) Take 1 tablet by mouth daily.     Marland Kitchen tiotropium (SPIRIVA) 18 MCG inhalation capsule Place 18 mcg into inhaler and inhale daily.    . vitamin B-12 (CYANOCOBALAMIN) 1000 MCG tablet Take 1,000 mcg by mouth daily.     Current Facility-Administered Medications  Medication Dose Route Frequency Provider Last Rate Last Dose  . ceFAZolin (ANCEF) IVPB 1 g/50 mL premix  1 g Intravenous Once Algernon Huxley, MD       Facility-Administered Medications Ordered in Other Visits  Medication Dose Route Frequency Provider Last Rate Last Dose  . heparin lock flush 100 unit/mL  500 Units Intravenous Once Lloyd Huger, MD      . sodium chloride 0.9 % injection 10 mL  10 mL Intravenous PRN Lloyd Huger, MD   10 mL at 02/09/15 0844    OBJECTIVE: Vitals:   02/06/17 1126  BP: (!) 152/78  Pulse: 78  Resp: 20  Temp: (!) 97.1 F (36.2 C)     Body mass index is 24.39 kg/m.    ECOG FS:0 - Asymptomatic  General: Well-developed, well-nourished, no acute distress. Eyes: anicteric sclera. HEENT: No palpable lymphadenopathy.  Lungs: Clear to auscultation bilaterally. Heart: Regular rate and rhythm. No rubs, murmurs, or gallops. Abdomen: Soft, nontender, nondistended. No organomegaly noted, normoactive bowel sounds. Musculoskeletal: No edema, cyanosis, or clubbing. Neuro: Alert, answering all questions appropriately. Cranial nerves grossly intact. Skin: No rashes or petechiae noted. Psych: Normal affect.   LAB RESULTS:  Lab Results  Component Value Date   NA 138 01/02/2017   K 4.2  01/02/2017   CL 104 01/02/2017   CO2 27 01/02/2017   GLUCOSE 89 01/02/2017   BUN 24 (H) 01/02/2017   CREATININE 1.10 (H) 01/02/2017   CALCIUM 10.0 01/02/2017   PROT 7.7 01/02/2017   ALBUMIN 4.0 01/02/2017   AST 53 (H) 01/02/2017   ALT 34 01/02/2017   ALKPHOS 322 (H) 01/02/2017   BILITOT 1.0 01/02/2017   GFRNONAA 48 (L) 01/02/2017   GFRAA 55 (L) 01/02/2017    Lab Results  Component Value Date   WBC 2.7 (L) 01/02/2017   NEUTROABS 0.6 (L) 01/02/2017   HGB 12.4 01/02/2017   HCT 36.6 01/02/2017   MCV 94.6 01/02/2017   PLT 133 (L) 01/02/2017     STUDIES: Nm Pet Image Restag (ps) Skull Base To Thigh  Result Date: 01/23/2017 CLINICAL DATA:  Initial treatment strategy  for left upper lobe squamous cell carcinoma and Hodgkin's lymphoma. Recent CT demonstrating enlargement of 2 spiculated right lower lobe lung masses. EXAM: NUCLEAR MEDICINE PET SKULL BASE TO THIGH TECHNIQUE: 13.5 mCi F-18 FDG was injected intravenously. Full-ring PET imaging was performed from the skull base to thigh after the radiotracer. CT data was obtained and used for attenuation correction and anatomic localization. FASTING BLOOD GLUCOSE:  Value: 70 for mg/dl COMPARISON:  Chest CT of 12/23/2016. Abdominopelvic CT of 10/07/2015. PET of 10/01/2014 FINDINGS: NECK: No areas of abnormal hypermetabolism. No cervical adenopathy. Bilateral carotid atherosclerosis. Mucosal thickening of bilateral maxillary sinuses. CHEST: Left upper lobe presumed radiation induced fibrosis with low-level mild hypermetabolism. Example image 94/series 3. 2 adjacent right lower lobe lung lesions. The more cephalad measures 2.2 cm and a S.U.V. max of 15.4 on image 96/series 3. The more inferior measures 2.8 cm and a S.U.V. max of 17.5 on image 102/series 3. Dependent left lower lobe hypermetabolism is without well-defined pulmonary nodule or mass. There is minimal dependent opacity which corresponds to a S.U.V. max of 6.1 on image 105/ series 3. No  thoracic nodal hypermetabolism. Advanced bullous type emphysema. Mild cardiomegaly with LAD coronary artery atherosclerosis. Focus of hyper attenuation within the right internal jugular and brachiocephalic veins is favored to be related to a fibrin sheath at the site of prior Port-A-Cath. Example image 68/series 3 ABDOMEN/PELVIS: Retroperitoneal nodal hypermetabolism is new since the prior PET. Example node measures 8 mm and a S.U.V. max of 4.6 on image 171/series 3. This is enlarged compared to 10/07/15. A node anterior to the right common iliac vasculature measures 6 mm and a S.U.V. max of 4.0 on image 184/series 3. This is similar in size to 10/07/2015. Pelvic sidewall hypermetabolic nodes, including index left inguinal node which measures 9 mm and a S.U.V. max of 3.1 on image 224/series 3. This node is increased in size compared to 10/07/15. Innumerable hyperattenuating renal lesions bilaterally. These are incompletely characterized. Abdominal aortic atherosclerosis. Hysterectomy. Pelvic floor laxity. SKELETON: No abnormal marrow activity. No focal osseous lesion. IMPRESSION: 1. Right lower lobe hypermetabolic pulmonary lesions are suspicious for metastatic disease. A left lower lobe focus of hypermetabolism is without well-defined pulmonary nodule and may relate to minimal atelectasis or inflammation. 2. Hypermetabolic abdominopelvic nodes which are favored to represent recurrent lymphoma. These are new since the prior PET and similar to slightly increased compared to 10/07/15 CT. Metastatic disease from lung primary felt less likely. 3. Bilateral renal lesions which are hyperattenuating and indeterminate on this noncontrast exam. Most likely complex cysts. If definitive characterization is desired, pre and post contrast abdominal MRI at the test of choice 4. Coronary artery atherosclerosis. Aortic Atherosclerosis (ICD10-I70.0). Electronically Signed   By: Abigail Miyamoto M.D.   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  PLAN:    1.  Hodgkin's lymphoma: Hypermetabolic abdominal pelvic lymph nodes seen on January 23, 2017 PET scan are new. Unclear etiology. Given patient's initial diagnosis was in February 2013, the timing and location would be unusual. Plan to discuss case at cancer conference later this week and then repeat PET scan in 3 months.  2.  Left upper lobe lung cancer: PET scan results reviewed independently and reported as above. Patient completed XRT the left upper lobe lung nodule which was biopsied and confirmed squamous cell carcinoma.   Case was previously discussed at cancer conference and a  recommendation was to continue to monitor with periodic CT scans. Previously, patient refused any further surgery or chemotherapy.  3. Right lower lobe nodule: PET scan results as above. Nodular densities have mildly progressed. Radiation oncology plans to biopsy and if positive to treat with SBRT. We will repeat PET scan as above. Return to clinic in 3 months for further evaluation.  4.  Pancytopenia: Patient's WBC count and platelet counts are all decreased but essentially unchanged and stable. She is no longer anemic. She is also asymptomatic. No intervention is needed at this time. This is possibly related to myelosuppression from previous chemotherapy from Hodgkin's. Continue to monitor.   5. Hypercalcemia: Resolved.  Approximately 30 minutes was spent in discussion of which greater than 50% was consultation.  Patient expressed understanding and was in agreement with this plan. She also understands that She can call clinic at any time with any questions, concerns, or complaints.   Lloyd Huger, MD   02/06/2017 12:33 PM

## 2017-02-06 ENCOUNTER — Encounter: Payer: Self-pay | Admitting: Radiation Oncology

## 2017-02-06 ENCOUNTER — Ambulatory Visit
Admission: RE | Admit: 2017-02-06 | Discharge: 2017-02-06 | Disposition: A | Discharge status: Home / Self Care | Payer: Medicare Other | Source: Ambulatory Visit | Attending: Radiation Oncology | Admitting: Radiation Oncology

## 2017-02-06 ENCOUNTER — Inpatient Hospital Stay: Payer: Medicare Other | Attending: Oncology | Admitting: Oncology

## 2017-02-06 ENCOUNTER — Other Ambulatory Visit: Payer: Self-pay | Admitting: *Deleted

## 2017-02-06 VITALS — BP 138/73 | HR 82 | Temp 96.1°F | Resp 20 | Wt 148.7 lb

## 2017-02-06 VITALS — BP 152/78 | HR 78 | Temp 97.1°F | Resp 20 | Wt 148.8 lb

## 2017-02-06 DIAGNOSIS — R05 Cough: Secondary | ICD-10-CM | POA: Insufficient documentation

## 2017-02-06 DIAGNOSIS — K219 Gastro-esophageal reflux disease without esophagitis: Secondary | ICD-10-CM | POA: Diagnosis not present

## 2017-02-06 DIAGNOSIS — C3412 Malignant neoplasm of upper lobe, left bronchus or lung: Secondary | ICD-10-CM

## 2017-02-06 DIAGNOSIS — Z923 Personal history of irradiation: Secondary | ICD-10-CM | POA: Diagnosis not present

## 2017-02-06 DIAGNOSIS — I10 Essential (primary) hypertension: Secondary | ICD-10-CM | POA: Insufficient documentation

## 2017-02-06 DIAGNOSIS — R918 Other nonspecific abnormal finding of lung field: Secondary | ICD-10-CM | POA: Diagnosis not present

## 2017-02-06 DIAGNOSIS — Z8571 Personal history of Hodgkin lymphoma: Secondary | ICD-10-CM | POA: Diagnosis not present

## 2017-02-06 DIAGNOSIS — Z9221 Personal history of antineoplastic chemotherapy: Secondary | ICD-10-CM | POA: Diagnosis not present

## 2017-02-06 DIAGNOSIS — R911 Solitary pulmonary nodule: Secondary | ICD-10-CM

## 2017-02-06 DIAGNOSIS — K5900 Constipation, unspecified: Secondary | ICD-10-CM | POA: Diagnosis not present

## 2017-02-06 DIAGNOSIS — D61818 Other pancytopenia: Secondary | ICD-10-CM | POA: Diagnosis not present

## 2017-02-06 DIAGNOSIS — Z79899 Other long term (current) drug therapy: Secondary | ICD-10-CM | POA: Insufficient documentation

## 2017-02-06 DIAGNOSIS — Z85118 Personal history of other malignant neoplasm of bronchus and lung: Secondary | ICD-10-CM | POA: Insufficient documentation

## 2017-02-06 DIAGNOSIS — Z87891 Personal history of nicotine dependence: Secondary | ICD-10-CM | POA: Insufficient documentation

## 2017-02-06 DIAGNOSIS — C8111 Nodular sclerosis classical Hodgkin lymphoma, lymph nodes of head, face, and neck: Secondary | ICD-10-CM

## 2017-02-06 NOTE — Progress Notes (Signed)
Radiation Oncology Follow up Note  Name: Marie Lowe   Date:   02/06/2017 MRN:  [id number] DOB: 1940-11-20    This 76 y.o. female presents to the clinic today for follow-up for right lower lobe lung lesions status post PET CT scan.  REFERRING PROVIDER: Marinda Elk, MD  HPI: Patient is a 76 year old female well-known to department now out 2 years having completed SB RT to her left upper lobe for stage I squamous cell carcinoma. We have been following some 2 nodules in the superior segment of the right lower lobe. I ordered a PET CT scan showing hypermetabolic activity in that area consistent with malignancy.. She does have some hypermetabolic activity in the abdominal pelvic lymph nodes which may represent recurrent lymphoma. She does have a focus of hypermetabolic activity in the left lower lobe although no pulmonary nodule is related to this lesion. This may be inflammation. She otherwise is doing well she states her allergies been bothering her she has a mild nonproductive cough.  COMPLICATIONS OF TREATMENT: none  FOLLOW UP COMPLIANCE: keeps appointments   PHYSICAL EXAM:  BP 138/73   Pulse 82   Temp (!) 96.1 F (35.6 C)   Resp 20   Wt 148 lb 11.2 oz (67.4 kg)   BMI 24.37 kg/m  Well-developed well-nourished patient in NAD. HEENT reveals PERLA, EOMI, discs not visualized.  Oral cavity is clear. No oral mucosal lesions are identified. Neck is clear without evidence of cervical or supraclavicular adenopathy. Lungs are clear to A&P. Cardiac examination is essentially unremarkable with regular rate and rhythm without murmur rub or thrill. Abdomen is benign with no organomegaly or masses noted. Motor sensory and DTR levels are equal and symmetric in the upper and lower extremities. Cranial nerves II through XII are grossly intact. Proprioception is intact. No peripheral adenopathy or edema is identified. No motor or sensory levels are noted. Crude visual fields are within normal  range.  RADIOLOGY RESULTS: PET CT scan is reviewed and compatible above-stated findings  PLAN: At this time of ordered a CT-guided biopsy of the right lower lobe lesion. Should this be malignancy which I believe will be I believe she would be a candidate for SB RT treatment to this area 5000 cGy in 5 fractions. I have set up a follow-up appointment with the patient after her biopsy. She continues close follow-up care with medical oncology.  I would like to take this opportunity to thank you for allowing me to participate in the care of your patient.Armstead Peaks., MD

## 2017-02-06 NOTE — Progress Notes (Signed)
Patient here today for results, denies any other concerns today.

## 2017-02-08 ENCOUNTER — Telehealth: Payer: Self-pay | Admitting: *Deleted

## 2017-02-08 NOTE — Telephone Encounter (Signed)
Marie Lowe notified of her CT biopsy appointment and her follow up with Dr. Baruch Gouty.  She read back appt. Dates and times and a copy of her AVS will also be mailed.

## 2017-02-15 ENCOUNTER — Other Ambulatory Visit: Payer: Self-pay | Admitting: Radiology

## 2017-02-16 ENCOUNTER — Ambulatory Visit
Admission: RE | Admit: 2017-02-16 | Discharge: 2017-02-16 | Disposition: A | Discharge status: Home / Self Care | Payer: Medicare Other | Source: Ambulatory Visit | Attending: Radiation Oncology | Admitting: Radiation Oncology

## 2017-02-16 DIAGNOSIS — C3431 Malignant neoplasm of lower lobe, right bronchus or lung: Secondary | ICD-10-CM | POA: Diagnosis not present

## 2017-02-16 DIAGNOSIS — Z8042 Family history of malignant neoplasm of prostate: Secondary | ICD-10-CM | POA: Diagnosis not present

## 2017-02-16 DIAGNOSIS — Z79899 Other long term (current) drug therapy: Secondary | ICD-10-CM | POA: Diagnosis not present

## 2017-02-16 DIAGNOSIS — Z8571 Personal history of Hodgkin lymphoma: Secondary | ICD-10-CM | POA: Diagnosis not present

## 2017-02-16 DIAGNOSIS — I780 Hereditary hemorrhagic telangiectasia: Secondary | ICD-10-CM | POA: Insufficient documentation

## 2017-02-16 DIAGNOSIS — K219 Gastro-esophageal reflux disease without esophagitis: Secondary | ICD-10-CM | POA: Insufficient documentation

## 2017-02-16 DIAGNOSIS — N189 Chronic kidney disease, unspecified: Secondary | ICD-10-CM | POA: Insufficient documentation

## 2017-02-16 DIAGNOSIS — Z87891 Personal history of nicotine dependence: Secondary | ICD-10-CM | POA: Diagnosis not present

## 2017-02-16 DIAGNOSIS — Z888 Allergy status to other drugs, medicaments and biological substances status: Secondary | ICD-10-CM | POA: Insufficient documentation

## 2017-02-16 DIAGNOSIS — I129 Hypertensive chronic kidney disease with stage 1 through stage 4 chronic kidney disease, or unspecified chronic kidney disease: Secondary | ICD-10-CM | POA: Diagnosis not present

## 2017-02-16 DIAGNOSIS — E7800 Pure hypercholesterolemia, unspecified: Secondary | ICD-10-CM | POA: Insufficient documentation

## 2017-02-16 DIAGNOSIS — R918 Other nonspecific abnormal finding of lung field: Secondary | ICD-10-CM | POA: Diagnosis present

## 2017-02-16 DIAGNOSIS — R911 Solitary pulmonary nodule: Secondary | ICD-10-CM

## 2017-02-16 DIAGNOSIS — Z9221 Personal history of antineoplastic chemotherapy: Secondary | ICD-10-CM | POA: Diagnosis not present

## 2017-02-16 DIAGNOSIS — J449 Chronic obstructive pulmonary disease, unspecified: Secondary | ICD-10-CM | POA: Insufficient documentation

## 2017-02-16 HISTORY — DX: Chronic kidney disease, unspecified: N18.9

## 2017-02-16 LAB — CBC
HEMATOCRIT: 40.2 % (ref 35.0–47.0)
Hemoglobin: 13.6 g/dL (ref 12.0–16.0)
MCH: 31.6 pg (ref 26.0–34.0)
MCHC: 33.8 g/dL (ref 32.0–36.0)
MCV: 93.6 fL (ref 80.0–100.0)
PLATELETS: 103 10*3/uL — AB (ref 150–440)
RBC: 4.3 MIL/uL (ref 3.80–5.20)
RDW: 16.6 % — AB (ref 11.5–14.5)
WBC: 2.7 10*3/uL — AB (ref 3.6–11.0)

## 2017-02-16 LAB — PROTIME-INR
INR: 0.99
Prothrombin Time: 13 seconds (ref 11.4–15.2)

## 2017-02-16 LAB — APTT: aPTT: 38 seconds — ABNORMAL HIGH (ref 24–36)

## 2017-02-16 MED ORDER — FENTANYL CITRATE (PF) 100 MCG/2ML IJ SOLN
INTRAMUSCULAR | Status: AC | PRN
  Administered 2017-02-16 (×2): 25 ug via INTRAVENOUS

## 2017-02-16 MED ORDER — SODIUM CHLORIDE 0.9 % IV SOLN
INTRAVENOUS | Status: DC
  Administered 2017-02-16: 09:00:00 via INTRAVENOUS

## 2017-02-16 MED ORDER — MIDAZOLAM HCL 5 MG/5ML IJ SOLN
INTRAMUSCULAR | Status: AC
  Filled 2017-02-16: qty 5

## 2017-02-16 MED ORDER — FENTANYL CITRATE (PF) 100 MCG/2ML IJ SOLN
INTRAMUSCULAR | Status: AC
  Filled 2017-02-16: qty 2

## 2017-02-16 MED ORDER — MIDAZOLAM HCL 5 MG/5ML IJ SOLN
INTRAMUSCULAR | Status: AC | PRN
  Administered 2017-02-16 (×2): 0.5 mg via INTRAVENOUS

## 2017-02-16 NOTE — H&P (Signed)
Chief Complaint: Patient was seen in consultation today for No chief complaint on file.  at the request of [place]  Referring Physician(s): Chrystal,Maryjane Benedict  Patient Status: [facility] - Out-pt  History of Present Illness: Marie Lowe is a 76 y.o. female with a history of LUL squamous lung carcinoma now with enlarging adjacent RLL masses demonstrating increased metabolic activity by PET.  Presents for CT guided biopsy prior to potential SBRT. May also have recurrent lymphoma in the abdomen and pelvis.  Some chronic dyspnea at home from chronic lung disease.  No current acute symptoms or complaints.  Past Medical History:  Diagnosis Date  . Anemia   . Chest pain, unspecified   . Chronic kidney disease   . COPD (chronic obstructive pulmonary disease) ([place])   . Epistaxis   . Erosive gastropathy   . GERD (gastroesophageal reflux disease)   . High cholesterol   . Hodgkin's lymphoma ([place]) 2014   chemo  . Hodgkin's lymphoma ([place])   . Hypercalcemia   . Hypertension   . Lung cancer ([place]) 2015  . Pneumonia   . 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 Shortness of breath dyspnea     Past Surgical History:  Procedure Laterality Date  . ABDOMINAL HYSTERECTOMY    . APPENDECTOMY    . COLONOSCOPY WITH PROPOFOL N/A 10/01/2015   Procedure: COLONOSCOPY WITH PROPOFOL;  Surgeon: Manya Silvas, MD;  Location: [facility] ENDOSCOPY;  Service: Endoscopy;  Laterality: N/A;  . NASAL SINUS SURGERY    . PORTA CATH REMOVAL N/A 07/21/2016   Procedure: Glori Luis Cath Removal;  Surgeon: Algernon Huxley, MD;  Location: [place] CV LAB;  Service: Cardiovascular;  Laterality: N/A;  . right lower lobe wedge resection      Allergies: Hydrochlorothiazide  Medications: Prior to Admission medications   Medication Sig Start Date End Date Taking? Authorizing Provider  ADVAIR [facility] 115-21 MCG/ACT inhaler  01/24/17  Yes [provider]  albuterol (PROVENTIL HFA;VENTOLIN HFA) 108 (90 BASE) MCG/ACT inhaler  Inhale 2 puffs into the lungs every 6 (six) hours as needed for wheezing or shortness of breath.   Yes [provider]  amLODipine-benazepril (LOTREL) 5-20 MG capsule Take 1 capsule by mouth daily. 11/01/14  Yes [provider]  antiseptic oral rinse (BIOTENE) LIQD 15 mLs by Mouth Rinse route as needed for dry mouth.   Yes [provider]  atorvastatin (LIPITOR) 10 MG tablet Take 10 mg by mouth every morning.  10/23/14  Yes [provider]  calcium carbonate (OS-CAL - DOSED IN MG OF ELEMENTAL CALCIUM) 1250 (500 Ca) MG tablet Take 1 tablet by mouth 2 (two) times daily with a meal.    Yes [provider]  carvedilol (COREG) 6.25 MG tablet Take 6.25 mg by mouth 2 (two) times daily. 11/02/14  Yes [provider]  mirtazapine (REMERON) 15 MG tablet Take by mouth. 01/04/17  Yes [provider]  Multiple Vitamins-Minerals (MULTIVITAMIN PO) Take 1 tablet by mouth daily.    Yes [provider]  tiotropium (SPIRIVA) 18 MCG inhalation capsule Place 18 mcg into inhaler and inhale daily.   Yes [provider]  vitamin B-12 (CYANOCOBALAMIN) 1000 MCG tablet Take 1,000 mcg by mouth daily.   Yes [provider]     Family History  Problem Relation Age of Onset  . Diabetes Other   . CAD Other   . Cancer Other        colon cancer, breast cancer  . Kidney disease Other   .  Breast cancer Maternal Aunt   . Prostate cancer Father   . Kidney cancer Neg Hx     Social History   Social History  . Marital status: Married    Spouse name: N/A  . Number of children: N/A  . Years of education: N/A   Social History Main Topics  . Smoking status: Former Smoker    Packs/day: 1.00    Years: 18.00    Types: Cigarettes    Quit date: 2013  . Smokeless tobacco: Never Used  . Alcohol use 0.6 oz/week    1 Glasses of wine per week     Comment: Occassionaly  . Drug use: No  . Sexual activity: Not Asked   Other Topics Concern  . None    Social History Narrative  . None    ECOG Status: 0 - Asymptomatic  Review of Systems: A 12 point ROS discussed and pertinent positives are indicated in the HPI above.  All other systems are negative.  Review of Systems  Constitutional: Negative.   HENT: Negative.   Respiratory:       Chronic dyspnea.  Currently not SOB.  Uses inhalers at home.   Cardiovascular: Negative.   Gastrointestinal: Negative.   Genitourinary: Negative.   Musculoskeletal: Negative.   Neurological: Negative.     Vital Signs: BP (!) 151/78   Pulse 93   Temp 97.7 F (36.5 C) (Oral)   Resp (!) 22   Ht 5\' 6"  (1.676 m)   SpO2 94%   Physical Exam  Constitutional: She is oriented to person, place, and time. She appears well-developed. No distress.  HENT:  Head: Normocephalic and atraumatic.  Neck: Neck supple. No JVD present.  Cardiovascular: Normal rate, regular rhythm and normal heart sounds.  Exam reveals no gallop and no friction rub.   No murmur heard. Pulmonary/Chest: Effort normal and breath sounds normal. No stridor. No respiratory distress. She has no wheezes. She has no rales.  Abdominal: Soft. Bowel sounds are normal. She exhibits no distension and no mass. There is no tenderness. There is no rebound and no guarding.  Musculoskeletal: She exhibits no edema.  Lymphadenopathy:    She has no cervical adenopathy.  Neurological: She is alert and oriented to person, place, and time.  Skin: She is not diaphoretic.  Vitals reviewed.   Imaging: Nm Pet Image Restag (ps) Skull Base To Thigh  Result Date: 01/23/2017 CLINICAL DATA:  Initial treatment strategy for left upper lobe squamous cell carcinoma and Hodgkin's lymphoma. Recent CT demonstrating enlargement of 2 spiculated right lower lobe lung masses. EXAM: NUCLEAR MEDICINE PET SKULL BASE TO THIGH TECHNIQUE: 13.5 mCi F-18 FDG was injected intravenously. Full-ring PET imaging was performed from the skull base to thigh after the radiotracer. CT  data was obtained and used for attenuation correction and anatomic localization. FASTING BLOOD GLUCOSE:  Value: 70 for mg/dl COMPARISON:  Chest CT of 12/23/2016. Abdominopelvic CT of 10/07/2015. PET of 10/01/2014 FINDINGS: NECK: No areas of abnormal hypermetabolism. No cervical adenopathy. Bilateral carotid atherosclerosis. Mucosal thickening of bilateral maxillary sinuses. CHEST: Left upper lobe presumed radiation induced fibrosis with low-level mild hypermetabolism. Example image 94/series 3. 2 adjacent right lower lobe lung lesions. The more cephalad measures 2.2 cm and a S.U.V. max of 15.4 on image 96/series 3. The more inferior measures 2.8 cm and a S.U.V. max of 17.5 on image 102/series 3. Dependent left lower lobe hypermetabolism is without well-defined pulmonary nodule or mass. There is minimal dependent opacity which corresponds to a  S.U.V. max of 6.1 on image 105/ series 3. No thoracic nodal hypermetabolism. Advanced bullous type emphysema. Mild cardiomegaly with LAD coronary artery atherosclerosis. Focus of hyper attenuation within the right internal jugular and brachiocephalic veins is favored to be related to a fibrin sheath at the site of prior Port-A-Cath. Example image 68/series 3 ABDOMEN/PELVIS: Retroperitoneal nodal hypermetabolism is new since the prior PET. Example node measures 8 mm and a S.U.V. max of 4.6 on image 171/series 3. This is enlarged compared to 10/07/15. A node anterior to the right common iliac vasculature measures 6 mm and a S.U.V. max of 4.0 on image 184/series 3. This is similar in size to 10/07/2015. Pelvic sidewall hypermetabolic nodes, including index left inguinal node which measures 9 mm and a S.U.V. max of 3.1 on image 224/series 3. This node is increased in size compared to 10/07/15. Innumerable hyperattenuating renal lesions bilaterally. These are incompletely characterized. Abdominal aortic atherosclerosis. Hysterectomy. Pelvic floor laxity. SKELETON: No abnormal marrow  activity. No focal osseous lesion. IMPRESSION: 1. Right lower lobe hypermetabolic pulmonary lesions are suspicious for metastatic disease. A left lower lobe focus of hypermetabolism is without well-defined pulmonary nodule and may relate to minimal atelectasis or inflammation. 2. Hypermetabolic abdominopelvic nodes which are favored to represent recurrent lymphoma. These are new since the prior PET and similar to slightly increased compared to 10/07/15 CT. Metastatic disease from lung primary felt less likely. 3. Bilateral renal lesions which are hyperattenuating and indeterminate on this noncontrast exam. Most likely complex cysts. If definitive characterization is desired, pre and post contrast abdominal MRI at the test of choice 4. Coronary artery atherosclerosis. Aortic Atherosclerosis (ICD10-I70.0). Electronically Signed   By: Abigail Miyamoto M.D.   On: 01/23/2017 11:24    Labs:  CBC:  Recent Labs  01/02/17 1229 02/16/17 0731  WBC 2.7* 2.7*  HGB 12.4 13.6  HCT 36.6 40.2  PLT 133* 103*    COAGS:  Recent Labs  02/16/17 0731  INR 0.99  APTT 38*    BMP:  Recent Labs  01/02/17 1229  NA 138  K 4.2  CL 104  CO2 27  GLUCOSE 89  BUN 24*  CALCIUM 10.0  CREATININE 1.10*  GFRNONAA 48*  GFRAA 55*    LIVER FUNCTION TESTS:  Recent Labs  01/02/17 1229  BILITOT 1.0  AST 53*  ALT 34  ALKPHOS 322*  PROT 7.7  ALBUMIN 4.0    Assessment and Plan:  For CT guided biopsy of RLL lung mass today.  Risks and benefits discussed with the patient including, but not limited to bleeding, hemoptysis, respiratory failure requiring intubation, infection, pneumothorax requiring chest tube placement, stroke from air embolism or even death. All of the patient's questions were answered, patient is agreeable to proceed. Consent signed and in chart.  Thank you for this interesting consult.  I greatly enjoyed meeting Marie Lowe and look forward to participating in their care.  A copy of this  report was sent to the requesting provider on this date.  Electronically Signed: Azzie Roup, MD 02/16/2017, 8:25 AM   I spent a total of 30 Minutes in face to face in clinical consultation, greater than 50% of which was counseling/coordinating care for right lung masses and CT guided biopsy.

## 2017-02-16 NOTE — Procedures (Signed)
Interventional Radiology Procedure Note  Procedure: CT guided RLL lung mass biopsy  Complications: None  Estimated Blood Loss: None  Via 17 G needle, 18 G core biopsy performed x 2 at level of posterior RLL lung mass, measuring 2.9 cm in greatest diameter. Post biopsy CT shows no PTX or hemorrhage.  Venetia Night. Kathlene Cote, M.D Pager:  [phone]

## 2017-02-23 ENCOUNTER — Ambulatory Visit
Admission: RE | Admit: 2017-02-23 | Discharge: 2017-02-23 | Disposition: A | Discharge status: Home / Self Care | Payer: Medicare Other | Source: Ambulatory Visit | Attending: Radiation Oncology | Admitting: Radiation Oncology

## 2017-02-23 ENCOUNTER — Encounter: Payer: Self-pay | Admitting: Radiation Oncology

## 2017-02-23 VITALS — BP 130/76 | HR 90 | Temp 96.2°F | Resp 18 | Wt 142.2 lb

## 2017-02-23 DIAGNOSIS — C3431 Malignant neoplasm of lower lobe, right bronchus or lung: Secondary | ICD-10-CM

## 2017-02-23 DIAGNOSIS — R918 Other nonspecific abnormal finding of lung field: Secondary | ICD-10-CM | POA: Diagnosis not present

## 2017-02-24 NOTE — Progress Notes (Signed)
Radiation Oncology Follow up Note  Name: Marie Lowe   Date:   02/23/2017 MRN:  [id number] DOB: [birth year]    This 76 y.o. female presents to the clinic today for follow up and to discuss recent biopsy of right lower lobe.Marland Kitchen  REFERRING PROVIDER: Marinda Elk, MD  HPI: patient is a 76 year old female having completed SB RT to her left upper lobe for stage I [place] cell carcinoma. We have been following 2 nodules in the superior segment of the right low lower lobe. I ordered a CT-guided biopsy which was positive for squamous cell carcinoma. She seen here today to discuss treatment planning options. She continues to do well and is asymptomatic.Marland Kitchen  COMPLICATIONS OF TREATMENT: none  FOLLOW UP COMPLIANCE: keeps appointments   PHYSICAL EXAM:  BP 130/76   Pulse 90   Temp (!) 96.2 F (35.7 C)   Resp 18   Wt 142 lb 3.2 oz (64.5 kg)   BMI 22.95 kg/m  Well-developed well-nourished patient in NAD. HEENT reveals PERLA, EOMI, discs not visualized.  Oral cavity is clear. No oral mucosal lesions are identified. Neck is clear without evidence of cervical or supraclavicular adenopathy. Lungs are clear to A&P. Cardiac examination is essentially unremarkable with regular rate and rhythm without murmur rub or thrill. Abdomen is benign with no organomegaly or masses noted. Motor sensory and DTR levels are equal and symmetric in the upper and lower extremities. Cranial nerves II through XII are grossly intact. Proprioception is intact. No peripheral adenopathy or edema is identified. No motor or sensory levels are noted. Crude visual fields are within normal range.  RADIOLOGY RESULTS: CT-guided biopsy films are reviewed  PLAN: at this time I have recommended SB RT to her right lower lobe lesions. I will try to incorporate both lesions in a single treatment field and treat to 5000 cGy in 5 fractions. Risks and benefits of treatment including possible cough possible skin reaction fatigue destruction of  normal lung volume all were discussed in detail with the patient. I personally ordered CT simulation with4 D study. Patient comprehend my treatment plan well. CT simulation was ordered.  I would like to take this opportunity to thank you for allowing me to participate in the care of your patient.Armstead Peaks., MD

## 2017-02-28 LAB — SURGICAL PATHOLOGY

## 2017-03-02 ENCOUNTER — Encounter: Payer: Self-pay | Admitting: Oncology

## 2017-03-02 ENCOUNTER — Ambulatory Visit
Admission: RE | Admit: 2017-03-02 | Discharge: 2017-03-02 | Disposition: A | Discharge status: Home / Self Care | Payer: Medicare Other | Source: Ambulatory Visit | Attending: Radiation Oncology | Admitting: Radiation Oncology

## 2017-03-02 DIAGNOSIS — R918 Other nonspecific abnormal finding of lung field: Secondary | ICD-10-CM | POA: Diagnosis not present

## 2017-03-06 DIAGNOSIS — R918 Other nonspecific abnormal finding of lung field: Secondary | ICD-10-CM | POA: Diagnosis not present

## 2017-03-10 ENCOUNTER — Other Ambulatory Visit: Payer: Self-pay | Admitting: *Deleted

## 2017-03-10 DIAGNOSIS — C7801 Secondary malignant neoplasm of right lung: Secondary | ICD-10-CM

## 2017-03-13 ENCOUNTER — Ambulatory Visit
Admission: RE | Admit: 2017-03-13 | Discharge: 2017-03-13 | Disposition: A | Discharge status: Home / Self Care | Payer: Medicare Other | Source: Ambulatory Visit | Attending: Radiation Oncology | Admitting: Radiation Oncology

## 2017-03-13 ENCOUNTER — Ambulatory Visit: Payer: Medicare Other

## 2017-03-14 ENCOUNTER — Ambulatory Visit: Payer: Medicare Other

## 2017-03-14 ENCOUNTER — Ambulatory Visit
Admission: RE | Admit: 2017-03-14 | Discharge: 2017-03-14 | Disposition: A | Discharge status: Home / Self Care | Payer: Medicare Other | Source: Ambulatory Visit | Attending: Radiation Oncology | Admitting: Radiation Oncology

## 2017-03-14 DIAGNOSIS — R918 Other nonspecific abnormal finding of lung field: Secondary | ICD-10-CM | POA: Diagnosis not present

## 2017-03-15 ENCOUNTER — Ambulatory Visit: Payer: Medicare Other

## 2017-03-15 ENCOUNTER — Ambulatory Visit
Admission: RE | Admit: 2017-03-15 | Discharge: 2017-03-15 | Disposition: A | Discharge status: Home / Self Care | Payer: Medicare Other | Source: Ambulatory Visit | Attending: Radiation Oncology | Admitting: Radiation Oncology

## 2017-03-15 DIAGNOSIS — R918 Other nonspecific abnormal finding of lung field: Secondary | ICD-10-CM | POA: Diagnosis not present

## 2017-03-16 ENCOUNTER — Ambulatory Visit
Admission: RE | Admit: 2017-03-16 | Discharge: 2017-03-16 | Disposition: A | Discharge status: Home / Self Care | Payer: Medicare Other | Source: Ambulatory Visit | Attending: Radiation Oncology | Admitting: Radiation Oncology

## 2017-03-16 ENCOUNTER — Ambulatory Visit: Payer: Medicare Other

## 2017-03-16 DIAGNOSIS — R918 Other nonspecific abnormal finding of lung field: Secondary | ICD-10-CM | POA: Diagnosis not present

## 2017-03-17 ENCOUNTER — Ambulatory Visit
Admission: RE | Admit: 2017-03-17 | Discharge: 2017-03-17 | Disposition: A | Discharge status: Home / Self Care | Payer: Medicare Other | Source: Ambulatory Visit | Attending: Radiation Oncology | Admitting: Radiation Oncology

## 2017-03-17 ENCOUNTER — Ambulatory Visit: Payer: Medicare Other

## 2017-03-17 DIAGNOSIS — R918 Other nonspecific abnormal finding of lung field: Secondary | ICD-10-CM | POA: Diagnosis not present

## 2017-03-20 ENCOUNTER — Ambulatory Visit: Payer: Medicare Other

## 2017-03-20 ENCOUNTER — Ambulatory Visit: Admission: RE | Admit: 2017-03-20 | Payer: Medicare Other | Source: Ambulatory Visit

## 2017-03-21 ENCOUNTER — Ambulatory Visit: Payer: Medicare Other

## 2017-03-21 ENCOUNTER — Inpatient Hospital Stay: Payer: BC Managed Care – PPO | Attending: Oncology

## 2017-03-21 ENCOUNTER — Ambulatory Visit
Admission: RE | Admit: 2017-03-21 | Discharge: 2017-03-21 | Disposition: A | Discharge status: Home / Self Care | Payer: Medicare Other | Source: Ambulatory Visit | Attending: Radiation Oncology | Admitting: Radiation Oncology

## 2017-03-21 DIAGNOSIS — R918 Other nonspecific abnormal finding of lung field: Secondary | ICD-10-CM | POA: Diagnosis not present

## 2017-03-22 ENCOUNTER — Ambulatory Visit
Admission: RE | Admit: 2017-03-22 | Discharge: 2017-03-22 | Disposition: A | Discharge status: Home / Self Care | Payer: Medicare Other | Source: Ambulatory Visit | Attending: Radiation Oncology | Admitting: Radiation Oncology

## 2017-03-22 ENCOUNTER — Ambulatory Visit: Payer: Medicare Other

## 2017-03-22 DIAGNOSIS — R918 Other nonspecific abnormal finding of lung field: Secondary | ICD-10-CM | POA: Diagnosis not present

## 2017-03-23 ENCOUNTER — Ambulatory Visit: Payer: Medicare Other

## 2017-03-23 ENCOUNTER — Ambulatory Visit
Admission: RE | Admit: 2017-03-23 | Discharge: 2017-03-23 | Disposition: A | Discharge status: Home / Self Care | Payer: Medicare Other | Source: Ambulatory Visit | Attending: Radiation Oncology | Admitting: Radiation Oncology

## 2017-03-23 DIAGNOSIS — R918 Other nonspecific abnormal finding of lung field: Secondary | ICD-10-CM | POA: Diagnosis not present

## 2017-03-24 ENCOUNTER — Ambulatory Visit
Admission: RE | Admit: 2017-03-24 | Discharge: 2017-03-24 | Disposition: A | Discharge status: Home / Self Care | Payer: Medicare Other | Source: Ambulatory Visit | Attending: Radiation Oncology | Admitting: Radiation Oncology

## 2017-03-24 ENCOUNTER — Ambulatory Visit: Payer: Medicare Other

## 2017-03-24 DIAGNOSIS — R918 Other nonspecific abnormal finding of lung field: Secondary | ICD-10-CM | POA: Diagnosis not present

## 2017-03-27 ENCOUNTER — Ambulatory Visit: Payer: Medicare Other

## 2017-03-27 ENCOUNTER — Ambulatory Visit
Admission: RE | Admit: 2017-03-27 | Discharge: 2017-03-27 | Disposition: A | Discharge status: Home / Self Care | Payer: Medicare Other | Source: Ambulatory Visit | Attending: Radiation Oncology | Admitting: Radiation Oncology

## 2017-03-27 DIAGNOSIS — R918 Other nonspecific abnormal finding of lung field: Secondary | ICD-10-CM | POA: Diagnosis not present

## 2017-03-28 ENCOUNTER — Ambulatory Visit
Admission: RE | Admit: 2017-03-28 | Discharge: 2017-03-28 | Disposition: A | Discharge status: Home / Self Care | Payer: Medicare Other | Source: Ambulatory Visit | Attending: Radiation Oncology | Admitting: Radiation Oncology

## 2017-03-28 DIAGNOSIS — R918 Other nonspecific abnormal finding of lung field: Secondary | ICD-10-CM | POA: Diagnosis not present

## 2017-04-10 IMAGING — CT CT CHEST-ABD-PELV W/ CM
1 of 3 series · 13 of 32 positions shown, 18 images · IV contrast (omnipaque)
Comparison: CT CAP 03/13/2014

CLINICAL DATA: Restaging evaluation for history of Hodgkin's
lymphoma.

EXAM:
CT CHEST, ABDOMEN, AND PELVIS WITH CONTRAST
TECHNIQUE: Multidetector CT imaging of the chest, abdomen and pelvis was
performed following the standard protocol during bolus
administration of intravenous contrast.
CONTRAST:  80 mL Omnipaque 300

[Series 2: cap with · axial · 0.64mm/px · z∈[-1060,-504]mm · 13 of 125 slices shown, 18 images]
[im 7/125  soft-tissue]
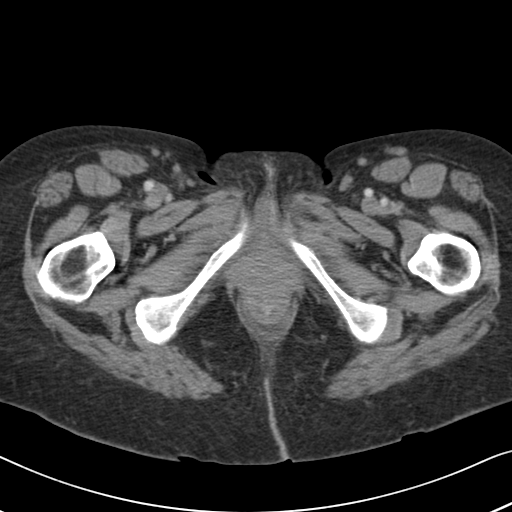
[im 7/125  bone]
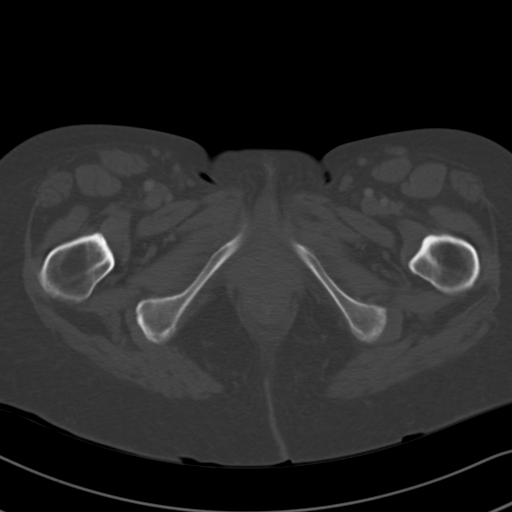
[im 21/125  soft-tissue]
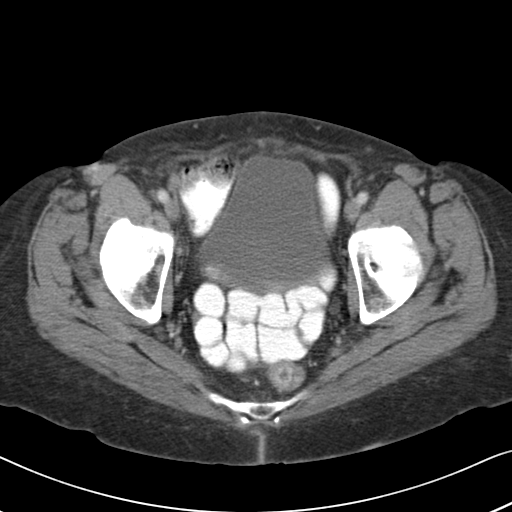
[im 28/125  soft-tissue]
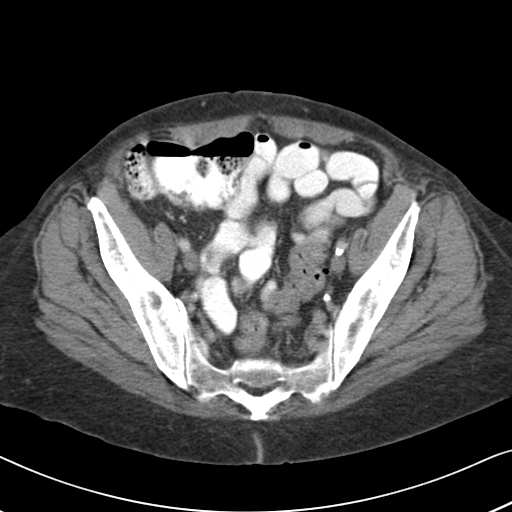
[im 35/125  soft-tissue]
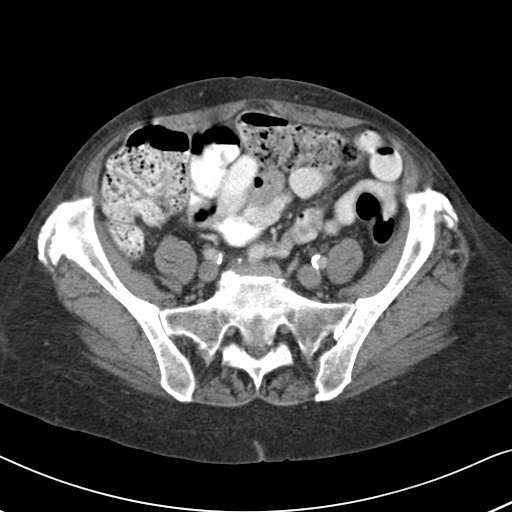
[im 49/125  soft-tissue]
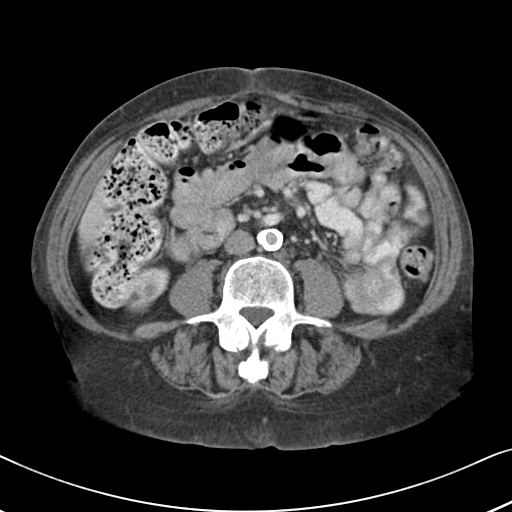
[im 56/125  soft-tissue]
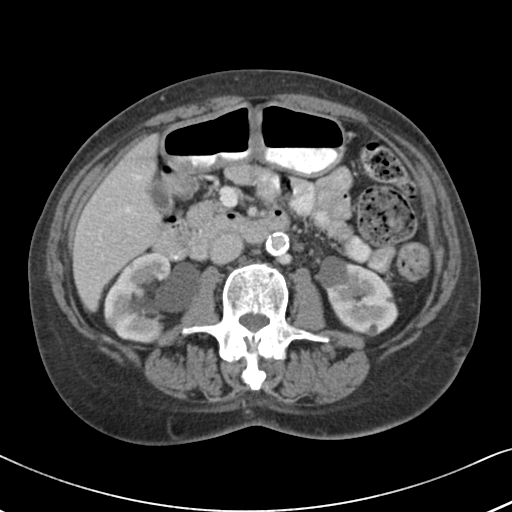
[im 69/125  soft-tissue]
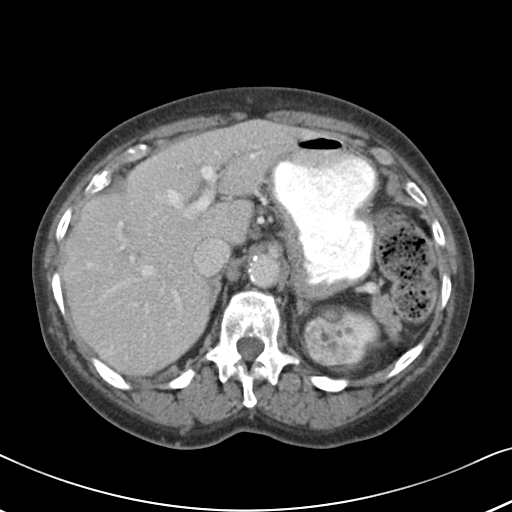
[im 76/125  soft-tissue]
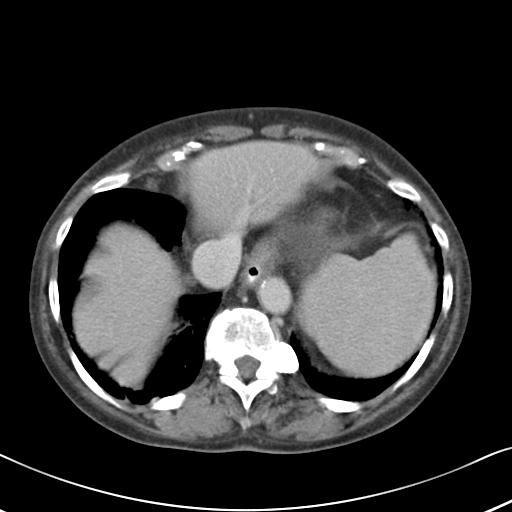
[im 90/125  soft-tissue]
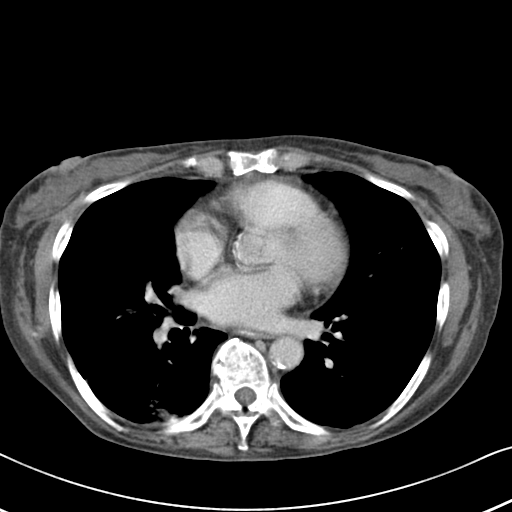
[im 90/125  bone]
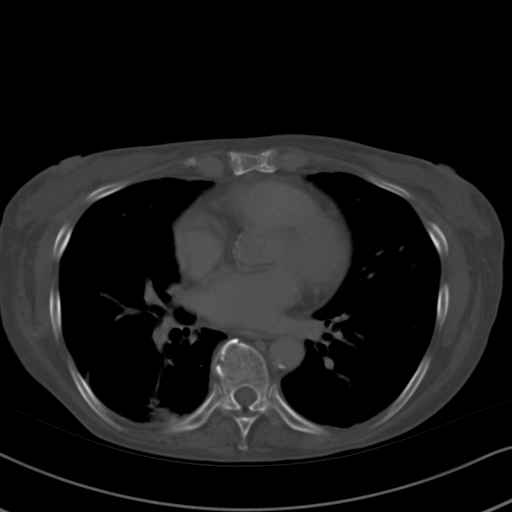
[im 97/125  soft-tissue]
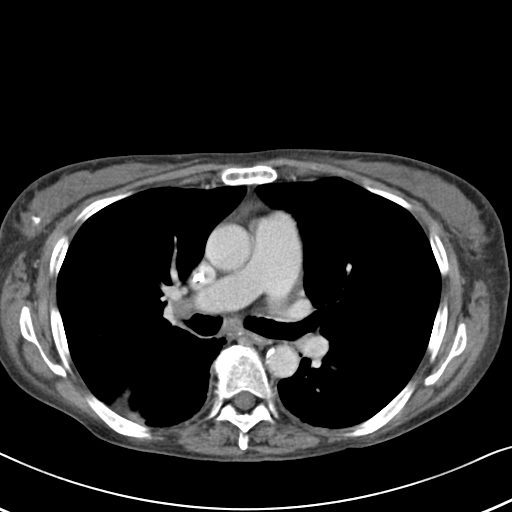
[im 97/125  lung]
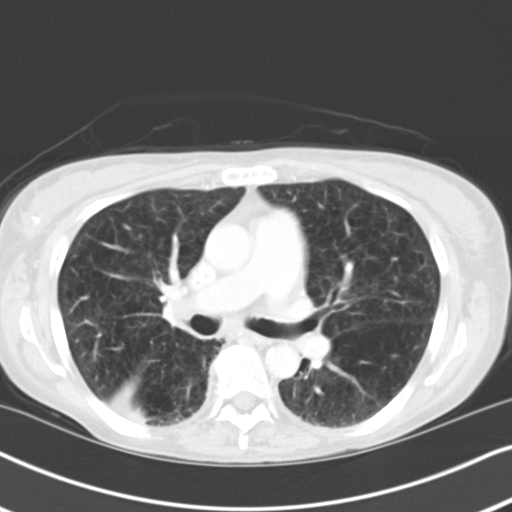
[im 104/125  soft-tissue]
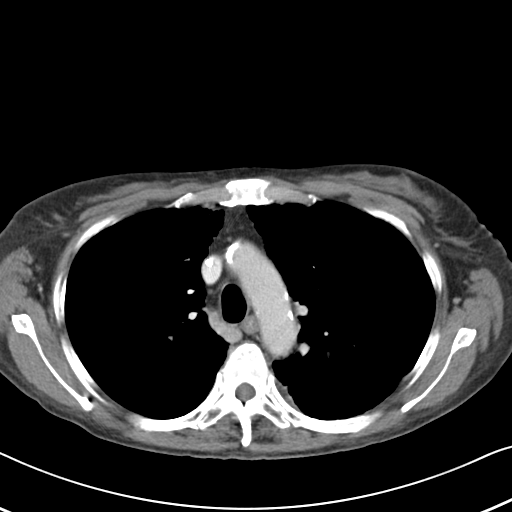
[im 104/125  lung]
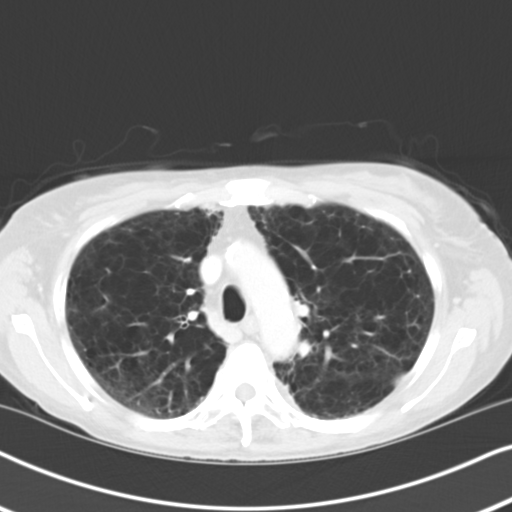
[im 111/125  lung]
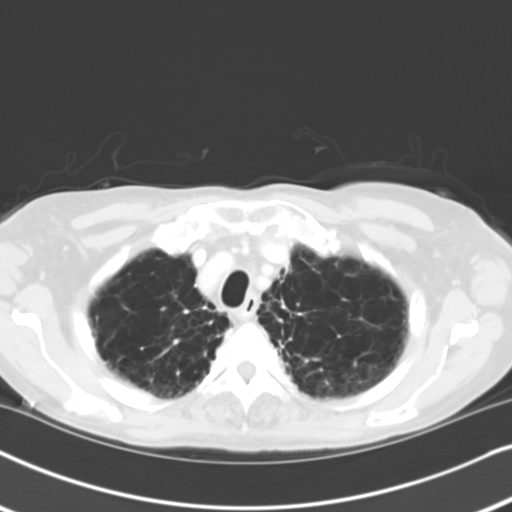
[im 118/125  soft-tissue]
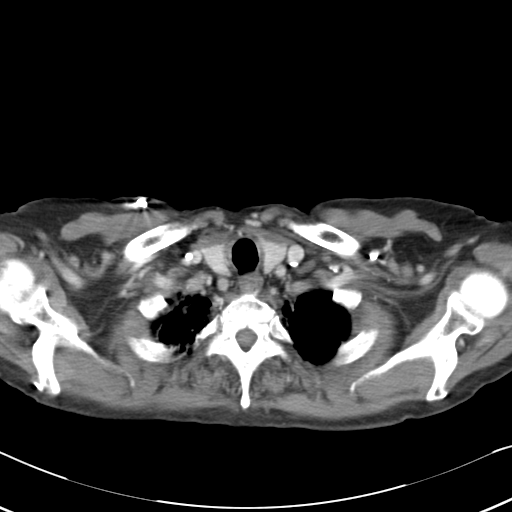
[im 118/125  lung]
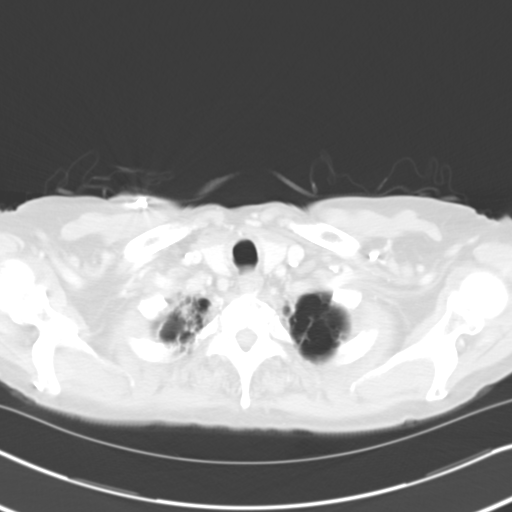

[13 of 32 positions shown; findings below may reference images not displayed]

FINDINGS: CT CHEST FINDINGS

Mediastinum/Nodes: No axillary lymphadenopathy. Normal heart size.
Trace pericardial fluid. Stable subcarinal lymph node measuring 10
mm, previously 9 mm (image 30; series 2). Stable right hilar lymph
node measuring 12 mm, previously 11 mm (image 29; series 2). Stable
left hilar lymph node measuring 8 mm, previously 8 mm (image 31;
series 2).

Lungs/Pleura: The central airways are patent. Extensive
centrilobular and paraseptal emphysematous change. Stable 3 mm left
upper lobe pulmonary nodule (image 19; series 5). Interval
development of a 1.0 x 0.9 cm left upper lobe nodule (image 32;
series 5). No significant interval change in subpleural right lower
lobe nodule measuring 11 mm (image 37; series 5), previously the
same. Slight interval decrease in size of spiculated nodule within
the right lower lobe (image 34; series 5), measuring 1.1 x 1.2 cm,
previously 1.3 x 1.4 cm.

Musculoskeletal: No aggressive or acute appearing osseous lesions.

CT ABDOMEN AND PELVIS FINDINGS

Hepatobiliary: The liver is normal in size and contour without focal
hepatic lesion identified. Gallbladder is decompressed. No
intrahepatic or extrahepatic biliary ductal dilatation.

Pancreas: Unremarkable

Spleen: Unremarkable

Adrenals/Urinary Tract: The adrenal glands are normal. No
significant interval change in 2.2 x 1.9 cm complex possibly
enhancing mass within the superior pole left kidney (image 8; series
4). Additional bilateral renal cysts of varying density are grossly
unchanged. No hydronephrosis.

Stomach/Bowel: Sigmoid colonic diverticulosis. No CT evidence to
suggest acute diverticulitis. No abnormal bowel wall thickening or
evidence for bowel obstruction.

Vascular/Lymphatic: Normal caliber abdominal aorta with peripheral
calcified atherosclerotic plaque. No retroperitoneal or pelvic
lymphadenopathy.

Other: No free fluid.

Musculoskeletal: No aggressive or acute appearing osseous lesions.
IMPRESSION: 1. Interval development of a new irregular left upper lobe pulmonary
nodule. Metastatic disease to this location is not excluded.
2. Slight interval decrease in size of irregular right lower lobe
pulmonary nodule when compared to prior examination. The additional
previously described right lower lobe nodule is stable.
3. Grossly stable hilar and mediastinal lymph nodes.
4. Grossly unchanged complicated lesion within the superior pole
left kidney, potentially representing a mixed cystic and solid
lesion. Continued attention on followup or possibly further
evaluation with MRI is recommended.

## 2017-05-03 ENCOUNTER — Ambulatory Visit: Payer: Medicare Other

## 2017-05-04 ENCOUNTER — Ambulatory Visit
Admission: RE | Admit: 2017-05-04 | Discharge: 2017-05-04 | Disposition: A | Discharge status: Home / Self Care | Payer: Medicare Other | Source: Ambulatory Visit | Attending: Oncology | Admitting: Oncology

## 2017-05-04 DIAGNOSIS — C8111 Nodular sclerosis classical Hodgkin lymphoma, lymph nodes of head, face, and neck: Secondary | ICD-10-CM | POA: Diagnosis not present

## 2017-05-04 DIAGNOSIS — I2510 Atherosclerotic heart disease of native coronary artery without angina pectoris: Secondary | ICD-10-CM | POA: Diagnosis not present

## 2017-05-04 DIAGNOSIS — I700 Atherosclerosis of aorta: Secondary | ICD-10-CM | POA: Diagnosis not present

## 2017-05-04 DIAGNOSIS — N289 Disorder of kidney and ureter, unspecified: Secondary | ICD-10-CM | POA: Diagnosis not present

## 2017-05-04 DIAGNOSIS — Z9889 Other specified postprocedural states: Secondary | ICD-10-CM | POA: Insufficient documentation

## 2017-05-04 LAB — GLUCOSE, CAPILLARY: GLUCOSE-CAPILLARY: 88 mg/dL (ref 65–99)

## 2017-05-04 MED ORDER — FLUDEOXYGLUCOSE F - 18 (FDG) INJECTION
12.0000 | Freq: Once | INTRAVENOUS | Status: AC | PRN
  Administered 2017-05-04: 13.23 via INTRAVENOUS

## 2017-05-08 ENCOUNTER — Ambulatory Visit: Payer: BC Managed Care – PPO | Admitting: Radiation Oncology

## 2017-05-08 ENCOUNTER — Inpatient Hospital Stay: Payer: BC Managed Care – PPO | Admitting: Oncology

## 2017-05-13 NOTE — Progress Notes (Signed)
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  INTERVAL HISTORY: Patient returns to clinic today for routine evaluation and discussion of her PET scan results. She continues to feel well and remains asymptomatic. She has a good appetite and her weight is stable. She denies any fevers or night sweats. She has no neurologic complaints.  She denies any nausea, vomiting, or diarrhea. She has no chest pain, cough, hemoptysis, or shortness of breath. She has no urinary complaints.  Patient offers no further specific complaints today.  REVIEW OF SYSTEMS:   Review of Systems  Constitutional: Negative.  Negative for fever and malaise/fatigue.  Respiratory: Negative.  Negative for cough, hemoptysis and shortness of breath.   Cardiovascular: Negative.  Negative for chest pain and leg swelling.  Gastrointestinal: Negative.  Negative for abdominal pain and constipation.  Genitourinary: Negative.   Musculoskeletal: Negative.   Skin: Negative.  Negative for rash.  Neurological: Negative.  Negative for sensory change, focal weakness and weakness.  Endo/Heme/Allergies: Negative.   Psychiatric/Behavioral: Negative.  Negative for memory loss. The patient is not nervous/anxious.     As per HPI. Otherwise, a complete review of systems is negative.  PAST MEDICAL HISTORY: Past Medical History:  Diagnosis Date  . Anemia   . Chest pain, unspecified   . Chronic kidney disease   . COPD (chronic obstructive pulmonary disease) ([place])   . Epistaxis   . Erosive gastropathy   . GERD (gastroesophageal reflux disease)   . High cholesterol   . Hodgkin's lymphoma ([place]) 2014   chemo  . Hodgkin's  lymphoma ([place])   . Hypercalcemia   . Hypertension   . Lung cancer ([place]) 2015  . Pneumonia   . 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 Shortness of breath dyspnea     PAST SURGICAL HISTORY: Past Surgical History:  Procedure Laterality Date  . ABDOMINAL HYSTERECTOMY    . APPENDECTOMY    . COLONOSCOPY WITH PROPOFOL N/A 10/01/2015   Procedure: COLONOSCOPY WITH PROPOFOL;  Surgeon: Manya Silvas, MD;  Location: [facility] ENDOSCOPY;  Service: Endoscopy;  Laterality: N/A;  . NASAL SINUS SURGERY    . PORTA CATH REMOVAL N/A 07/21/2016   Procedure: Glori Luis Cath Removal;  Surgeon: Algernon Huxley, MD;  Location: [facility] CV LAB;  Service: Cardiovascular;  Laterality: N/A;  . right lower lobe wedge resection      FAMILY HISTORY Family History  Problem Relation Age of Onset  . Diabetes Other   . CAD Other   . Cancer Other        colon cancer, breast cancer  . Kidney disease Other   . Breast cancer Maternal Aunt   . Prostate cancer Father   . Kidney cancer Neg Hx        ADVANCED DIRECTIVES:    HEALTH MAINTENANCE: Social History   Tobacco Use  . Smoking status: Former Smoker    Packs/day: 1.00    Years: 18.00    Pack years: 18.00    Types: Cigarettes    Last attempt to quit: 2013    Years since quitting: 5.9  . Smokeless tobacco: Never Used  Substance Use Topics  . Alcohol use: Yes    Alcohol/week: 0.6 oz    Types: 1  Glasses of wine per week    Comment: Occassionaly  . Drug use: No     Colonoscopy:  PAP:  Bone density:  Lipid panel:  Allergies  Allergen Reactions  . Hydrochlorothiazide Other (See Comments)    Elevation of creatinine Patient doesn't remember what reaction she had to it.    Current Outpatient Medications  Medication Sig Dispense Refill  . ADVAIR HFA 115-21 MCG/ACT inhaler     . albuterol (PROVENTIL HFA;VENTOLIN HFA) 108 (90 BASE) MCG/ACT inhaler Inhale 2 puffs into the lungs every 6 (six) hours as needed for wheezing or shortness of breath.    Marland Kitchen  amLODipine-benazepril (LOTREL) 5-20 MG capsule Take 1 capsule by mouth daily.  3  . antiseptic oral rinse (BIOTENE) LIQD 15 mLs by Mouth Rinse route as needed for dry mouth.    Marland Kitchen atorvastatin (LIPITOR) 10 MG tablet Take 10 mg by mouth every morning.     . calcium carbonate (OS-CAL - DOSED IN MG OF ELEMENTAL CALCIUM) 1250 (500 Ca) MG tablet Take 1 tablet by mouth 2 (two) times daily with a meal.     . carvedilol (COREG) 6.25 MG tablet Take 6.25 mg by mouth 2 (two) times daily.  5  . mirtazapine (REMERON) 15 MG tablet Take by mouth.    . Multiple Vitamins-Minerals (MULTIVITAMIN PO) Take 1 tablet by mouth daily.     Marland Kitchen tiotropium (SPIRIVA) 18 MCG inhalation capsule Place 18 mcg into inhaler and inhale daily.    . traMADol (ULTRAM) 50 MG tablet TAKE 1 TABLET (50 MG TOTAL) BY MOUTH EVERY 6 (SIX) HOURS AS NEEDED FOR PAIN FOR UP TO 5 DAYS.  1  . vitamin B-12 (CYANOCOBALAMIN) 1000 MCG tablet Take 1,000 mcg by mouth daily.     No current facility-administered medications for this visit.    Facility-Administered Medications Ordered in Other Visits  Medication Dose Route Frequency Provider Last Rate Last Dose  . heparin lock flush 100 unit/mL  500 Units Intravenous Once Lloyd Huger, MD      . sodium chloride 0.9 % injection 10 mL  10 mL Intravenous PRN Lloyd Huger, MD   10 mL at 02/09/15 0844    OBJECTIVE: There were no vitals filed for this visit.   There is no height or weight on file to calculate BMI.    ECOG FS:0 - Asymptomatic  General: Well-developed, well-nourished, no acute distress. Eyes: anicteric sclera. HEENT: No palpable lymphadenopathy.  Lungs: Clear to auscultation bilaterally. Heart: Regular rate and rhythm. No rubs, murmurs, or gallops. Abdomen: Soft, nontender, nondistended. No organomegaly noted, normoactive bowel sounds. Musculoskeletal: No edema, cyanosis, or clubbing. Neuro: Alert, answering all questions appropriately. Cranial nerves grossly intact. Skin: No  rashes or petechiae noted. Psych: Normal affect.   LAB RESULTS:  Lab Results  Component Value Date   NA 138 01/02/2017   K 4.2 01/02/2017   CL 104 01/02/2017   CO2 27 01/02/2017   GLUCOSE 89 01/02/2017   BUN 24 (H) 01/02/2017   CREATININE 1.10 (H) 01/02/2017   CALCIUM 10.0 01/02/2017   PROT 7.7 01/02/2017   ALBUMIN 4.0 01/02/2017   AST 53 (H) 01/02/2017   ALT 34 01/02/2017   ALKPHOS 322 (H) 01/02/2017   BILITOT 1.0 01/02/2017   GFRNONAA 48 (L) 01/02/2017   GFRAA 55 (L) 01/02/2017    Lab Results  Component Value Date   WBC 2.7 (L) 02/16/2017   NEUTROABS 0.6 (L) 01/02/2017   HGB 13.6 02/16/2017   HCT 40.2 02/16/2017  MCV 93.6 02/16/2017   PLT 103 (L) 02/16/2017     STUDIES: Nm Pet Image Restag (ps) Skull Base To Thigh  Result Date: 05/04/2017 CLINICAL DATA:  Subsequent treatment strategy for nodular Hodgkin's lymphoma of nodes of the neck. History of left upper lobe squamous cell carcinoma. EXAM: NUCLEAR MEDICINE PET SKULL BASE TO THIGH TECHNIQUE: 13.2 mCi F-18 FDG was injected intravenously. Full-ring PET imaging was performed from the skull base to thigh after the radiotracer. CT data was obtained and used for attenuation correction and anatomic localization. FASTING BLOOD GLUCOSE:  Value: 88 mg/dl COMPARISON:  01/23/2017 FINDINGS: NECK: No areas of abnormal hypermetabolism. No cervical adenopathy. Bilateral carotid atherosclerosis. Mucosal thickening of left greater than right maxillary sinuses. CHEST: Right lower lobe pulmonary nodule is less well-defined today. Measures on the order of 1.2 cm and a S.U.V. max of 2.4 on image 96/ series 3. Compare 2.2 cm and a S.U.V. max of 15.4 on the prior. Probable fibrosis within the lingula with low-level hypermetabolism, grossly similar. Subpleural left lower lobe hypermetabolism is again identified and corresponds to minimal interstitial thickening. This measures a S.U.V. max of 6.1 on image 108/series 3 versus a S.U.V. max of 6.1 on  the prior exam. The more caudal right lower lobe focus of hypermetabolism has resolved. No thoracic nodal hypermetabolism. Mild cardiomegaly. Lad coronary artery atherosclerosis. Emphysema. ABDOMEN/PELVIS: Resolution of hypermetabolic abdominal nodes. Hypermetabolic left external iliac node measures 8 mm and a S.U.V. max of 2.7 on image 209/series 3 versus 8 mm and a S.U.V. max of 3.0 on the prior exam. Left inguinal nodes measure maximally 7 mm and a S.U.V. max of 2.2 on image 227/series 3. Compare 9 mm and a S.U.V. max of 3.1 on the prior. Abdominal aortic atherosclerosis. Bilateral hyperattenuating renal lesions. Similar in number and size to on the prior. The largest is in the anterior interpolar right kidney at 2.3 cm. Hysterectomy. SKELETON: Nonspecific low-level hypermetabolism is identified throughout the lumbar spine. This is new and is without CT correlate. IMPRESSION: 1. Response to therapy of right lower lobe pulmonary nodules. Persistent left lower lobe subpleural hypermetabolism which may be post infectious or inflammatory. 2. Resolution of abdominal nodal hypermetabolism. Mild response to therapy of pelvic nodal hypermetabolism. 3. Grossly similar bilateral renal lesions which are again most likely complex cysts. 4. Coronary artery atherosclerosis. Aortic Atherosclerosis (ICD10-I70.0). Electronically Signed   By: Abigail Miyamoto M.D.   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  PLAN:    1.  Hodgkin's lymphoma: Patient's abdominal and pelvic lymph nodes seen on previous PET scan have improved, therefore unlikely related to underlying malignancy.  Patient's initial diagnosis was in February 2013.  2.  Left upper lobe lung cancer: PET scan results reviewed independently and reported as above. Patient completed XRT the left upper lobe lung nodule which was biopsied and confirmed squamous  cell carcinoma.   Case was previously discussed at cancer conference and a recommendation was to continue to monitor with periodic CT scans. Previously, patient refused any further surgery or chemotherapy.  3. Right lower lobe nodule: Repeat biopsy confirmed squamous cell carcinoma and patient completed treatment with XRT in November 2018.  PET scan results as above. Return to clinic in 4 months with repeat imaging using PET scan and further evaluation.  If PET scan is stable, can continue further imaging with CT scans. 4.  Pancytopenia: Patient's WBC count and  platelet counts are all decreased but essentially unchanged and stable. She is no longer anemic. She is also asymptomatic. No intervention is needed at this time. This is possibly related to myelosuppression from previous chemotherapy from Hodgkin's. Continue to monitor.   5. Hypercalcemia: Resolved.  Approximately 30 minutes was spent in discussion of which greater than 50% was consultation.  Patient expressed understanding and was in agreement with this plan. She also understands that She can call clinic at any time with any questions, concerns, or complaints.   Lloyd Huger, MD   05/16/2017 2:11 PM

## 2017-05-15 ENCOUNTER — Other Ambulatory Visit: Payer: Self-pay

## 2017-05-15 ENCOUNTER — Ambulatory Visit
Admission: RE | Admit: 2017-05-15 | Discharge: 2017-05-15 | Disposition: A | Discharge status: Home / Self Care | Payer: Medicare Other | Source: Ambulatory Visit | Attending: Radiation Oncology | Admitting: Radiation Oncology

## 2017-05-15 ENCOUNTER — Encounter: Payer: Self-pay | Admitting: Radiation Oncology

## 2017-05-15 ENCOUNTER — Inpatient Hospital Stay: Payer: Medicare Other | Attending: Oncology | Admitting: Oncology

## 2017-05-15 VITALS — BP 165/78 | HR 100 | Temp 97.9°F | Resp 20 | Wt 145.0 lb

## 2017-05-15 DIAGNOSIS — Z8042 Family history of malignant neoplasm of prostate: Secondary | ICD-10-CM | POA: Insufficient documentation

## 2017-05-15 DIAGNOSIS — Z923 Personal history of irradiation: Secondary | ICD-10-CM | POA: Insufficient documentation

## 2017-05-15 DIAGNOSIS — E7800 Pure hypercholesterolemia, unspecified: Secondary | ICD-10-CM

## 2017-05-15 DIAGNOSIS — I129 Hypertensive chronic kidney disease with stage 1 through stage 4 chronic kidney disease, or unspecified chronic kidney disease: Secondary | ICD-10-CM | POA: Insufficient documentation

## 2017-05-15 DIAGNOSIS — Z9221 Personal history of antineoplastic chemotherapy: Secondary | ICD-10-CM | POA: Insufficient documentation

## 2017-05-15 DIAGNOSIS — Z803 Family history of malignant neoplasm of breast: Secondary | ICD-10-CM | POA: Insufficient documentation

## 2017-05-15 DIAGNOSIS — Z87891 Personal history of nicotine dependence: Secondary | ICD-10-CM | POA: Insufficient documentation

## 2017-05-15 DIAGNOSIS — J449 Chronic obstructive pulmonary disease, unspecified: Secondary | ICD-10-CM | POA: Diagnosis not present

## 2017-05-15 DIAGNOSIS — I700 Atherosclerosis of aorta: Secondary | ICD-10-CM

## 2017-05-15 DIAGNOSIS — Z800 Family history of malignant neoplasm of digestive organs: Secondary | ICD-10-CM | POA: Diagnosis not present

## 2017-05-15 DIAGNOSIS — Z8571 Personal history of Hodgkin lymphoma: Secondary | ICD-10-CM | POA: Diagnosis not present

## 2017-05-15 DIAGNOSIS — C3412 Malignant neoplasm of upper lobe, left bronchus or lung: Secondary | ICD-10-CM

## 2017-05-15 DIAGNOSIS — Z90710 Acquired absence of both cervix and uterus: Secondary | ICD-10-CM | POA: Diagnosis not present

## 2017-05-15 DIAGNOSIS — C8190 Hodgkin lymphoma, unspecified, unspecified site: Secondary | ICD-10-CM | POA: Insufficient documentation

## 2017-05-15 DIAGNOSIS — D61818 Other pancytopenia: Secondary | ICD-10-CM | POA: Insufficient documentation

## 2017-05-15 DIAGNOSIS — R05 Cough: Secondary | ICD-10-CM | POA: Diagnosis not present

## 2017-05-15 DIAGNOSIS — K219 Gastro-esophageal reflux disease without esophagitis: Secondary | ICD-10-CM | POA: Diagnosis not present

## 2017-05-15 DIAGNOSIS — Z79899 Other long term (current) drug therapy: Secondary | ICD-10-CM | POA: Diagnosis not present

## 2017-05-15 DIAGNOSIS — I2510 Atherosclerotic heart disease of native coronary artery without angina pectoris: Secondary | ICD-10-CM | POA: Insufficient documentation

## 2017-05-15 DIAGNOSIS — N189 Chronic kidney disease, unspecified: Secondary | ICD-10-CM | POA: Diagnosis not present

## 2017-05-15 DIAGNOSIS — C3431 Malignant neoplasm of lower lobe, right bronchus or lung: Secondary | ICD-10-CM | POA: Diagnosis present

## 2017-05-15 DIAGNOSIS — I6523 Occlusion and stenosis of bilateral carotid arteries: Secondary | ICD-10-CM | POA: Insufficient documentation

## 2017-05-15 DIAGNOSIS — C8111 Nodular sclerosis classical Hodgkin lymphoma, lymph nodes of head, face, and neck: Secondary | ICD-10-CM

## 2017-05-15 DIAGNOSIS — R911 Solitary pulmonary nodule: Secondary | ICD-10-CM | POA: Insufficient documentation

## 2017-05-15 NOTE — Progress Notes (Signed)
Patient was seen in radiation today.

## 2017-05-15 NOTE — Progress Notes (Signed)
Radiation Oncology Follow up Note  Name: Marie Lowe   Date:   05/15/2017 MRN:  [id number] DOB: Apr 16, 1941    This 76 y.o. female presents to the clinic today for one-month follow-up status post SB RT to right lower lobe for squamous cell carcinoma.  REFERRING PROVIDER: Marinda Elk, MD  HPI: Patient is a 76 year old female now 1 month out having completed SB RT to her right lower lobe for screw cell carcinoma. She had previously been treated to her left upper lobe again for stage I squamous cell carcinoma seen today in routine follow-up she is doing well. She has a slight cough. She otherwise specifically denies dysphagia or any change in her pulmonary function.. She recently had a PET CT scan showing response to therapy in the right lower lobe.  COMPLICATIONS OF TREATMENT: none  FOLLOW UP COMPLIANCE: keeps appointments   PHYSICAL EXAM:  BP (!) 165/78   Pulse 100   Temp 97.9 F (36.6 C)   Resp 20   Wt 144 lb 15.2 oz (65.8 kg)   BMI 23.40 kg/m  Well-developed well-nourished patient in NAD. HEENT reveals PERLA, EOMI, discs not visualized.  Oral cavity is clear. No oral mucosal lesions are identified. Neck is clear without evidence of cervical or supraclavicular adenopathy. Lungs are clear to A&P. Cardiac examination is essentially unremarkable with regular rate and rhythm without murmur rub or thrill. Abdomen is benign with no organomegaly or masses noted. Motor sensory and DTR levels are equal and symmetric in the upper and lower extremities. Cranial nerves II through XII are grossly intact. Proprioception is intact. No peripheral adenopathy or edema is identified. No motor or sensory levels are noted. Crude visual fields are within normal range.  RADIOLOGY RESULTS: PET CT reviewed and compatible with the above-stated findings    PLAN: present time patient is stable. I'm please were overall progress. I've asked to see her back in 3-4 months for follow-up. She continues  close follow-up care with medical oncology. Patient is to call with any concerns.  I would like to take this opportunity to thank you for allowing me to participate in the care of your patient.Noreene Filbert, MD

## 2017-05-17 ENCOUNTER — Other Ambulatory Visit: Payer: Self-pay | Admitting: Physician Assistant

## 2017-05-17 DIAGNOSIS — Z1231 Encounter for screening mammogram for malignant neoplasm of breast: Secondary | ICD-10-CM

## 2017-06-20 ENCOUNTER — Ambulatory Visit
Admission: RE | Admit: 2017-06-20 | Discharge: 2017-06-20 | Disposition: A | Discharge status: Home / Self Care | Payer: Medicare Other | Source: Ambulatory Visit | Attending: Physician Assistant | Admitting: Physician Assistant

## 2017-06-20 DIAGNOSIS — Z1231 Encounter for screening mammogram for malignant neoplasm of breast: Secondary | ICD-10-CM | POA: Diagnosis not present

## 2017-06-29 ENCOUNTER — Other Ambulatory Visit: Payer: Self-pay | Admitting: Physician Assistant

## 2017-06-29 ENCOUNTER — Ambulatory Visit
Admission: RE | Admit: 2017-06-29 | Discharge: 2017-06-29 | Disposition: A | Discharge status: Home / Self Care | Payer: Medicare Other | Source: Ambulatory Visit | Attending: Physician Assistant | Admitting: Physician Assistant

## 2017-06-29 DIAGNOSIS — R600 Localized edema: Secondary | ICD-10-CM | POA: Insufficient documentation

## 2017-09-13 ENCOUNTER — Other Ambulatory Visit: Payer: Self-pay

## 2017-09-13 ENCOUNTER — Inpatient Hospital Stay: Payer: Medicare Other | Attending: Oncology

## 2017-09-13 ENCOUNTER — Encounter
Admission: RE | Admit: 2017-09-13 | Discharge: 2017-09-13 | Disposition: A | Discharge status: Home / Self Care | Payer: Medicare Other | Source: Ambulatory Visit | Attending: Oncology | Admitting: Oncology

## 2017-09-13 DIAGNOSIS — C3412 Malignant neoplasm of upper lobe, left bronchus or lung: Secondary | ICD-10-CM | POA: Insufficient documentation

## 2017-09-13 DIAGNOSIS — Z8571 Personal history of Hodgkin lymphoma: Secondary | ICD-10-CM | POA: Insufficient documentation

## 2017-09-13 DIAGNOSIS — I129 Hypertensive chronic kidney disease with stage 1 through stage 4 chronic kidney disease, or unspecified chronic kidney disease: Secondary | ICD-10-CM | POA: Diagnosis not present

## 2017-09-13 DIAGNOSIS — C8111 Nodular sclerosis classical Hodgkin lymphoma, lymph nodes of head, face, and neck: Secondary | ICD-10-CM

## 2017-09-13 DIAGNOSIS — D72819 Decreased white blood cell count, unspecified: Secondary | ICD-10-CM | POA: Diagnosis not present

## 2017-09-13 DIAGNOSIS — C3431 Malignant neoplasm of lower lobe, right bronchus or lung: Secondary | ICD-10-CM | POA: Insufficient documentation

## 2017-09-13 DIAGNOSIS — Z87891 Personal history of nicotine dependence: Secondary | ICD-10-CM | POA: Insufficient documentation

## 2017-09-13 DIAGNOSIS — C7801 Secondary malignant neoplasm of right lung: Secondary | ICD-10-CM

## 2017-09-13 DIAGNOSIS — N189 Chronic kidney disease, unspecified: Secondary | ICD-10-CM | POA: Insufficient documentation

## 2017-09-13 LAB — CBC
HCT: 37.1 % (ref 35.0–47.0)
HEMOGLOBIN: 12.5 g/dL (ref 12.0–16.0)
MCH: 31.9 pg (ref 26.0–34.0)
MCHC: 33.7 g/dL (ref 32.0–36.0)
MCV: 94.8 fL (ref 80.0–100.0)
PLATELETS: 177 10*3/uL (ref 150–440)
RBC: 3.91 MIL/uL (ref 3.80–5.20)
RDW: 16.8 % — ABNORMAL HIGH (ref 11.5–14.5)
WBC: 3.1 10*3/uL — ABNORMAL LOW (ref 3.6–11.0)

## 2017-09-13 LAB — LACTATE DEHYDROGENASE: LDH: 160 U/L (ref 98–192)

## 2017-09-15 ENCOUNTER — Ambulatory Visit
Admission: RE | Admit: 2017-09-15 | Discharge: 2017-09-15 | Disposition: A | Discharge status: Home / Self Care | Payer: Medicare Other | Source: Ambulatory Visit | Attending: Oncology | Admitting: Oncology

## 2017-09-15 DIAGNOSIS — C3412 Malignant neoplasm of upper lobe, left bronchus or lung: Secondary | ICD-10-CM | POA: Diagnosis not present

## 2017-09-15 DIAGNOSIS — I700 Atherosclerosis of aorta: Secondary | ICD-10-CM | POA: Insufficient documentation

## 2017-09-15 DIAGNOSIS — C8111 Nodular sclerosis classical Hodgkin lymphoma, lymph nodes of head, face, and neck: Secondary | ICD-10-CM | POA: Diagnosis present

## 2017-09-15 DIAGNOSIS — J439 Emphysema, unspecified: Secondary | ICD-10-CM | POA: Insufficient documentation

## 2017-09-15 DIAGNOSIS — R918 Other nonspecific abnormal finding of lung field: Secondary | ICD-10-CM | POA: Insufficient documentation

## 2017-09-15 LAB — GLUCOSE, CAPILLARY: GLUCOSE-CAPILLARY: 82 mg/dL (ref 65–99)

## 2017-09-15 MED ORDER — FLUDEOXYGLUCOSE F - 18 (FDG) INJECTION
6.9000 | Freq: Once | INTRAVENOUS | Status: AC | PRN
  Administered 2017-09-15: 7.2 via INTRAVENOUS

## 2017-09-17 NOTE — Progress Notes (Signed)
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  INTERVAL HISTORY: Patient returns to clinic today for further evaluation and discussion of her imaging results.  She continues to feel well and remains asymptomatic.  She recently had a bout of pneumonia, but this has since resolved.  She has a good appetite and her weight is stable. She denies any fevers or night sweats. She has no neurologic complaints.  She denies any nausea, vomiting, or diarrhea. She has no chest pain, cough, hemoptysis, or shortness of breath. She has no urinary complaints.  Patient feels at her baseline offers no specific complaints today.  REVIEW OF SYSTEMS:   Review of Systems  Constitutional: Negative.  Negative for fever and malaise/fatigue.  Respiratory: Negative.  Negative for cough, hemoptysis and shortness of breath.   Cardiovascular: Negative.  Negative for chest pain and leg swelling.  Gastrointestinal: Negative.  Negative for abdominal pain and constipation.  Genitourinary: Negative.  Negative for dysuria.  Musculoskeletal: Negative.   Skin: Negative.  Negative for rash.  Neurological: Negative.  Negative for sensory change, focal weakness and weakness.  Endo/Heme/Allergies: Negative.   Psychiatric/Behavioral: Negative.  Negative for memory loss. The patient is not nervous/anxious.     As per HPI. Otherwise, a complete review of systems is negative.  PAST MEDICAL HISTORY: Past Medical History:  Diagnosis Date  . Anemia   . Chest pain, unspecified   . Chronic kidney disease   . COPD (chronic obstructive pulmonary disease) ([place])   . Epistaxis   . Erosive gastropathy   . GERD  (gastroesophageal reflux disease)   . High cholesterol   . Hodgkin's lymphoma ([place]) 2014   chemo  . Hodgkin's lymphoma ([place])   . Hypercalcemia   . Hypertension   . Lung cancer ([place]) 2015  . Pneumonia   . 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 Shortness of breath dyspnea     PAST SURGICAL HISTORY: Past Surgical History:  Procedure Laterality Date  . ABDOMINAL HYSTERECTOMY    . APPENDECTOMY    . COLONOSCOPY WITH PROPOFOL N/A 10/01/2015   Procedure: COLONOSCOPY WITH PROPOFOL;  Surgeon: Manya Silvas, MD;  Location: [facility] ENDOSCOPY;  Service: Endoscopy;  Laterality: N/A;  . NASAL SINUS SURGERY    . PORTA CATH REMOVAL N/A 07/21/2016   Procedure: Glori Luis Cath Removal;  Surgeon: Algernon Huxley, MD;  Location: [facility];  Service: Cardiovascular;  Laterality: N/A;  . right lower lobe wedge resection      FAMILY HISTORY Family History  Problem Relation Age of Onset  . Diabetes Other   . CAD Other   . Cancer Other        colon cancer, breast cancer  . Kidney disease Other   . Breast cancer Maternal Aunt   . Prostate cancer Father   . Kidney cancer Neg Hx        ADVANCED DIRECTIVES:    HEALTH MAINTENANCE: Social History   Tobacco Use  . Smoking status: Former Smoker    Packs/day: 1.00    Years: 18.00    Pack years: 18.00    Types: Cigarettes    Last attempt to quit: 2013    Years since quitting: 6.3  . Smokeless tobacco: Never Used  Substance Use Topics  . Alcohol use: Yes    Alcohol/week: 0.6 oz    Types: 1 Glasses of wine per week    Comment: Occassionaly  . Drug use: No     Colonoscopy:  PAP:  Bone density:  Lipid panel:  Allergies  Allergen Reactions  . Hydrochlorothiazide Other (See Comments)    Elevation of creatinine Patient doesn't remember what reaction she had to it.    Current Outpatient Medications  Medication Sig Dispense Refill  . ADVAIR HFA 115-21 MCG/ACT inhaler     . albuterol (PROVENTIL HFA;VENTOLIN HFA) 108 (90 BASE) MCG/ACT  inhaler Inhale 2 puffs into the lungs every 6 (six) hours as needed for wheezing or shortness of breath.    Marland Kitchen amLODipine-benazepril (LOTREL) 5-20 MG capsule Take 1 capsule by mouth daily.  3  . antiseptic oral rinse (BIOTENE) LIQD 15 mLs by Mouth Rinse route as needed for dry mouth.    Marland Kitchen atorvastatin (LIPITOR) 10 MG tablet Take 10 mg by mouth every morning.     . calcium carbonate (OS-CAL - DOSED IN MG OF ELEMENTAL CALCIUM) 1250 (500 Ca) MG tablet Take 1 tablet by mouth 2 (two) times daily with a meal.     . carvedilol (COREG) 6.25 MG tablet Take 6.25 mg by mouth 2 (two) times daily.  5  . mirtazapine (REMERON) 15 MG tablet Take by mouth.    . Multiple Vitamins-Minerals (MULTIVITAMIN PO) Take 1 tablet by mouth daily.     Marland Kitchen tiotropium (SPIRIVA) 18 MCG inhalation capsule Place 18 mcg into inhaler and inhale daily.    . traMADol (ULTRAM) 50 MG tablet TAKE 1 TABLET (50 MG TOTAL) BY MOUTH EVERY 6 (SIX) HOURS AS NEEDED FOR PAIN FOR UP TO 5 DAYS.  1  . vitamin B-12 (CYANOCOBALAMIN) 1000 MCG tablet Take 1,000 mcg by mouth daily.     No current facility-administered medications for this visit.    Facility-Administered Medications Ordered in Other Visits  Medication Dose Route Frequency Provider Last Rate Last Dose  . heparin lock flush 100 unit/mL  500 Units Intravenous Once Lloyd Huger, MD      . sodium chloride 0.9 % injection 10 mL  10 mL Intravenous PRN Lloyd Huger, MD   10 mL at 02/09/15 0844    OBJECTIVE: Vitals:   09/18/17 1419  BP: 137/73  Pulse: 90  Resp: 18  Temp: 97.8 F (36.6 C)  SpO2: 98%     Body mass index is 21.84 kg/m.    ECOG FS:0 - Asymptomatic  General: Well-developed, well-nourished, no acute distress. Eyes: Pink conjunctiva, anicteric sclera. HEENT: Normocephalic, moist mucous membranes, clear oropharnyx.  No palpable lymphadenopathy. Lungs: Clear to auscultation bilaterally. Heart: Regular rate and rhythm. No rubs, murmurs, or gallops. Abdomen: Soft,  nontender, nondistended. No organomegaly noted, normoactive bowel sounds. Musculoskeletal: No edema, cyanosis, or clubbing. Neuro: Alert, answering all questions appropriately. Cranial nerves grossly intact. Skin: No rashes or petechiae noted. Psych: Normal affect.  LAB RESULTS:  Lab Results  Component Value Date   NA 138 01/02/2017   K 4.2 01/02/2017   CL 104 01/02/2017   CO2 27 01/02/2017   GLUCOSE 89 01/02/2017   BUN 24 (H) 01/02/2017   CREATININE 1.10 (H) 01/02/2017   CALCIUM 10.0 01/02/2017   PROT 7.7 01/02/2017   ALBUMIN 4.0 01/02/2017   AST 53 (H) 01/02/2017   ALT 34 01/02/2017   ALKPHOS 322 (H) 01/02/2017   BILITOT 1.0 01/02/2017   GFRNONAA 48 (L) 01/02/2017  GFRAA 55 (L) 01/02/2017    Lab Results  Component Value Date   WBC 3.1 (L) 09/13/2017   NEUTROABS 0.6 (L) 01/02/2017   HGB 12.5 09/13/2017   HCT 37.1 09/13/2017   MCV 94.8 09/13/2017   PLT 177 09/13/2017     STUDIES: Nm Pet Image Restag (ps) Skull Base To Thigh  Result Date: 09/17/2017 CLINICAL DATA:  Subsequent treatment strategy for left upper lobe lung cancer. Hodgkin's lymphoma of neck. Recently treated for pneumonia with antibiotics. No radiation therapy or chemotherapy for 1-2 years. EXAM: NUCLEAR MEDICINE PET SKULL BASE TO THIGH TECHNIQUE: 7.2 mCi F-18 FDG was injected intravenously. Full-ring PET imaging was performed from the skull base to thigh after the radiotracer. CT data was obtained and used for attenuation correction and anatomic localization. Fasting blood glucose: 82 mg/dl COMPARISON:  05/04/2017 PET FINDINGS: Mediastinal blood pool activity: SUV max 1.9 NECK: No areas of abnormal hypermetabolism. Incidental CT findings: No cervical adenopathy. Bilateral carotid atherosclerosis. Mucosal thickening left maxillary sinus. CHEST: Right lower lobe partially calcified dependent airspace disease with nodular component. The more medial portion is felt to be increased, including on image 92/3. This  measures a S.U.V. max of 2.6 today versus a S.U.V. max of 2.4 on the prior. The previously described nodular component is similar in size, including at 12 mm. Dependent left lower lobe pulmonary opacity corresponding to hypermetabolism. This measures a S.U.V. max of 6.4 including on image 99/3. Compare a S.U.V. max of 6.1 on the prior. No thoracic nodal hypermetabolism. Incidental CT findings: Bullous type emphysema. Subpleural interstitial thickening. A left lower lobe nodule measures 7 mm on image 96/3 versus 3 mm on the prior exam. This is felt to be medial to the area of hypermetabolism. Lingular scarring. ABDOMEN/PELVIS: No abdominal nodal hypermetabolism. The previously described left external iliac and left inguinal hypermetabolic nodes have resolved. A focus of descending colonic hypermetabolism is new and may correspond to subtle pericolonic edema including on image 160/2. This measures a S.U.V. max of 10.3. Incidental CT findings: Dense aortic atherosclerosis. Hyperattenuating renal lesions again identified. These are similar in size to on the prior, including in the upper pole left kidney at 1.5 cm and the upper pole right kidney at 2.2 cm. Dystrophic right renal parenchymal calcification. Hysterectomy. Trace cul-de-sac fluid is felt to be increased since the prior. SKELETON: No abnormal marrow activity. Incidental CT findings: No acute osseous abnormality. IMPRESSION: 1. Response to therapy of hypermetabolic pelvic nodes. No typical findings of hypermetabolic active lymphoma. 2. Descending colonic hypermetabolism with subtle pericolonic edema. Suspect mild diverticulitis. 3. Persistent bilateral lower lobe pulmonary hypermetabolism with interstitial thickening and mild airspace disease. Considerations include interstitial lung disease or chronic infection. 4. Left lower lobe pulmonary nodule, enlarged since the prior exam. Recommend attention on follow-up. 5. Aortic atherosclerosis (ICD10-I70.0) and  emphysema (ICD10-J43.9). 6. Increase in trace cul-de-sac fluid. 7. Indeterminate renal lesions. Favored to represent complex cysts, similar. Electronically Signed   By: Abigail Miyamoto M.D.   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  PLAN:    1.  Hodgkin's lymphoma: Patient was initially diagnosed in February 2013.  PET scan results from September 17, 2017 reviewed independently and reported as above with no evidence of disease.  No intervention is needed at this time.   2.  Left lower lobe nodule: Mildly increased in size from 3 to 7 mm.  Continue to monitor  closely. 2.  Left upper lobe lung cancer: Previously biopsied and confirmed squamous cell carcinoma.  Patient was subsequently treated with XRT only.  PET scan results as above.  No intervention needed, continue to monitor.   3. Right lower lobe nodule: Also biopsied and confirmed to be squamous cell carcinoma.  Patient completed treatment with XRT in November 2018.  PET scan results as above.  Return to clinic in 6 months with repeat imaging using CT of the chest and further evaluation.   4.  Leukopenia: Patient's white blood cell count is 3.1 and relatively stable.  No intervention is needed.   Continue to monitor.   5. Hypercalcemia: Resolved.  Approximately 30 minutes spent in discussion of which greater than 50% was consultation.  Patient expressed understanding and was in agreement with this plan. She also understands that She can call clinic at any time with any questions, concerns, or complaints.   Lloyd Huger, MD   09/22/2017 8:56 AM

## 2017-09-18 ENCOUNTER — Encounter: Payer: Self-pay | Admitting: Oncology

## 2017-09-18 ENCOUNTER — Encounter: Payer: Self-pay | Admitting: Radiation Oncology

## 2017-09-18 ENCOUNTER — Inpatient Hospital Stay (HOSPITAL_BASED_OUTPATIENT_CLINIC_OR_DEPARTMENT_OTHER): Payer: Medicare Other | Admitting: Oncology

## 2017-09-18 ENCOUNTER — Other Ambulatory Visit: Payer: Self-pay

## 2017-09-18 ENCOUNTER — Ambulatory Visit
Admission: RE | Admit: 2017-09-18 | Discharge: 2017-09-18 | Disposition: A | Discharge status: Home / Self Care | Payer: Medicare Other | Source: Ambulatory Visit | Attending: Radiation Oncology | Admitting: Radiation Oncology

## 2017-09-18 VITALS — BP 151/72 | HR 95 | Temp 96.8°F | Resp 18 | Wt 135.4 lb

## 2017-09-18 VITALS — BP 137/73 | HR 90 | Temp 97.8°F | Resp 18 | Wt 135.3 lb

## 2017-09-18 DIAGNOSIS — N189 Chronic kidney disease, unspecified: Secondary | ICD-10-CM | POA: Diagnosis not present

## 2017-09-18 DIAGNOSIS — C3431 Malignant neoplasm of lower lobe, right bronchus or lung: Secondary | ICD-10-CM | POA: Diagnosis not present

## 2017-09-18 DIAGNOSIS — Z8571 Personal history of Hodgkin lymphoma: Secondary | ICD-10-CM | POA: Diagnosis not present

## 2017-09-18 DIAGNOSIS — D72819 Decreased white blood cell count, unspecified: Secondary | ICD-10-CM | POA: Diagnosis not present

## 2017-09-18 DIAGNOSIS — C7801 Secondary malignant neoplasm of right lung: Secondary | ICD-10-CM | POA: Insufficient documentation

## 2017-09-18 DIAGNOSIS — Z87891 Personal history of nicotine dependence: Secondary | ICD-10-CM | POA: Diagnosis not present

## 2017-09-18 DIAGNOSIS — Z923 Personal history of irradiation: Secondary | ICD-10-CM | POA: Diagnosis not present

## 2017-09-18 DIAGNOSIS — C3412 Malignant neoplasm of upper lobe, left bronchus or lung: Secondary | ICD-10-CM

## 2017-09-18 DIAGNOSIS — C8111 Nodular sclerosis classical Hodgkin lymphoma, lymph nodes of head, face, and neck: Secondary | ICD-10-CM

## 2017-09-18 DIAGNOSIS — I129 Hypertensive chronic kidney disease with stage 1 through stage 4 chronic kidney disease, or unspecified chronic kidney disease: Secondary | ICD-10-CM | POA: Diagnosis not present

## 2017-09-18 DIAGNOSIS — R911 Solitary pulmonary nodule: Secondary | ICD-10-CM | POA: Insufficient documentation

## 2017-09-18 NOTE — Progress Notes (Signed)
Radiation Oncology Follow up Note  Name: Marie Lowe   Date:   09/18/2017 MRN:  [id number] DOB: 1940/11/20    This 77 y.o. female presents to the clinic today for four-month follow-up status post SB RT to her right lower lobe squamous cell carcinoma.  REFERRING PROVIDER: Marinda Elk, MD  HPI: patient is a 77 year old female now out 4 months having completed SB RT to her right lower lobe for squamous cell carcinoma. Seen today in routine follow-up she is doing well. She specifically denies cough hemoptysis or chest tightness..she recently had a PET CT scan showing excellent response in the right lower lobe although the left lower lobe has a pulmonary nodule enlarging since prior examination with recognition for follow-up. This lesion this flat and pleural-based.  COMPLICATIONS OF TREATMENT: none  FOLLOW UP COMPLIANCE: keeps appointments   PHYSICAL EXAM:  BP (!) 151/72   Pulse 95   Temp (!) 96.8 F (36 C)   Resp 18   Wt 135 lb 5.8 oz (61.4 kg)   BMI 21.85 kg/m  Well-developed well-nourished patient in NAD. HEENT reveals PERLA, EOMI, discs not visualized.  Oral cavity is clear. No oral mucosal lesions are identified. Neck is clear without evidence of cervical or supraclavicular adenopathy. Lungs are clear to A&P. Cardiac examination is essentially unremarkable with regular rate and rhythm without murmur rub or thrill. Abdomen is benign with no organomegaly or masses noted. Motor sensory and DTR levels are equal and symmetric in the upper and lower extremities. Cranial nerves II through XII are grossly intact. Proprioception is intact. No peripheral adenopathy or edema is identified. No motor or sensory levels are noted. Crude visual fields are within normal range.  RADIOLOGY RESULTS: PET CT scan reviewed and compatible with the above-stated findings  PLAN: at this time I would continue to observe her left lower lobe lesion. I would see her back in 6 months with a repeat a CT  scan at that time should this area continue growing also could offer SB RT to this lesion.patient compress my treatment plan well has follow-up exam with Dr. Grayland Ormond this afternoon. I have asked to see her back in 6 months with a CT scan prior to that visit. Should there be noticeable growth prior to that visit we'll see her immediately for follow-up. Patient is to call with any concerns. She And my treatment plan well.  I would like to take this opportunity to thank you for allowing me to participate in the care of your patient.Noreene Filbert, MD

## 2017-09-18 NOTE — Progress Notes (Signed)
Pt in for follow up states doing well.  Reports 3 weeks ago having pneumonia, recently finished antibiotics and prednisone.

## 2017-12-08 ENCOUNTER — Ambulatory Visit: Payer: Medicare Other | Admitting: Urology

## 2017-12-08 ENCOUNTER — Ambulatory Visit: Admission: RE | Admit: 2017-12-08 | Payer: Medicare Other | Source: Ambulatory Visit

## 2017-12-19 ENCOUNTER — Other Ambulatory Visit: Payer: Self-pay | Admitting: Physician Assistant

## 2017-12-19 DIAGNOSIS — Z1231 Encounter for screening mammogram for malignant neoplasm of breast: Secondary | ICD-10-CM

## 2018-01-03 ENCOUNTER — Other Ambulatory Visit: Payer: Self-pay

## 2018-01-03 ENCOUNTER — Inpatient Hospital Stay
Admission: EM | Admit: 2018-01-03 | Discharge: 2018-01-07 | DRG: 640 | Disposition: A | Discharge status: Home / Self Care | Payer: Medicare Other | Attending: Internal Medicine | Admitting: Internal Medicine

## 2018-01-03 ENCOUNTER — Encounter: Payer: Self-pay | Admitting: Emergency Medicine

## 2018-01-03 ENCOUNTER — Emergency Department: Payer: Medicare Other

## 2018-01-03 DIAGNOSIS — G459 Transient cerebral ischemic attack, unspecified: Secondary | ICD-10-CM | POA: Diagnosis not present

## 2018-01-03 DIAGNOSIS — I129 Hypertensive chronic kidney disease with stage 1 through stage 4 chronic kidney disease, or unspecified chronic kidney disease: Secondary | ICD-10-CM | POA: Diagnosis present

## 2018-01-03 DIAGNOSIS — Z66 Do not resuscitate: Secondary | ICD-10-CM | POA: Diagnosis present

## 2018-01-03 DIAGNOSIS — Z800 Family history of malignant neoplasm of digestive organs: Secondary | ICD-10-CM | POA: Diagnosis not present

## 2018-01-03 DIAGNOSIS — C3490 Malignant neoplasm of unspecified part of unspecified bronchus or lung: Secondary | ICD-10-CM | POA: Diagnosis not present

## 2018-01-03 DIAGNOSIS — D72819 Decreased white blood cell count, unspecified: Secondary | ICD-10-CM | POA: Diagnosis not present

## 2018-01-03 DIAGNOSIS — C3412 Malignant neoplasm of upper lobe, left bronchus or lung: Secondary | ICD-10-CM | POA: Diagnosis not present

## 2018-01-03 DIAGNOSIS — K219 Gastro-esophageal reflux disease without esophagitis: Secondary | ICD-10-CM | POA: Diagnosis present

## 2018-01-03 DIAGNOSIS — Z9221 Personal history of antineoplastic chemotherapy: Secondary | ICD-10-CM

## 2018-01-03 DIAGNOSIS — C3431 Malignant neoplasm of lower lobe, right bronchus or lung: Secondary | ICD-10-CM | POA: Diagnosis not present

## 2018-01-03 DIAGNOSIS — N183 Chronic kidney disease, stage 3 (moderate): Secondary | ICD-10-CM | POA: Diagnosis present

## 2018-01-03 DIAGNOSIS — Z87891 Personal history of nicotine dependence: Secondary | ICD-10-CM

## 2018-01-03 DIAGNOSIS — I780 Hereditary hemorrhagic telangiectasia: Secondary | ICD-10-CM | POA: Diagnosis present

## 2018-01-03 DIAGNOSIS — J329 Chronic sinusitis, unspecified: Secondary | ICD-10-CM | POA: Diagnosis present

## 2018-01-03 DIAGNOSIS — Z90710 Acquired absence of both cervix and uterus: Secondary | ICD-10-CM | POA: Diagnosis not present

## 2018-01-03 DIAGNOSIS — R1310 Dysphagia, unspecified: Secondary | ICD-10-CM | POA: Diagnosis present

## 2018-01-03 DIAGNOSIS — Z923 Personal history of irradiation: Secondary | ICD-10-CM

## 2018-01-03 DIAGNOSIS — C8190 Hodgkin lymphoma, unspecified, unspecified site: Secondary | ICD-10-CM | POA: Diagnosis present

## 2018-01-03 DIAGNOSIS — Z803 Family history of malignant neoplasm of breast: Secondary | ICD-10-CM

## 2018-01-03 DIAGNOSIS — C8590 Non-Hodgkin lymphoma, unspecified, unspecified site: Secondary | ICD-10-CM | POA: Diagnosis not present

## 2018-01-03 DIAGNOSIS — J440 Chronic obstructive pulmonary disease with acute lower respiratory infection: Secondary | ICD-10-CM | POA: Diagnosis present

## 2018-01-03 DIAGNOSIS — J40 Bronchitis, not specified as acute or chronic: Secondary | ICD-10-CM | POA: Diagnosis present

## 2018-01-03 DIAGNOSIS — D696 Thrombocytopenia, unspecified: Secondary | ICD-10-CM | POA: Diagnosis not present

## 2018-01-03 DIAGNOSIS — Z79899 Other long term (current) drug therapy: Secondary | ICD-10-CM | POA: Diagnosis not present

## 2018-01-03 DIAGNOSIS — D61818 Other pancytopenia: Secondary | ICD-10-CM | POA: Diagnosis present

## 2018-01-03 DIAGNOSIS — E7800 Pure hypercholesterolemia, unspecified: Secondary | ICD-10-CM | POA: Diagnosis present

## 2018-01-03 DIAGNOSIS — D631 Anemia in chronic kidney disease: Secondary | ICD-10-CM | POA: Diagnosis present

## 2018-01-03 DIAGNOSIS — Z8042 Family history of malignant neoplasm of prostate: Secondary | ICD-10-CM | POA: Diagnosis not present

## 2018-01-03 DIAGNOSIS — R918 Other nonspecific abnormal finding of lung field: Secondary | ICD-10-CM | POA: Diagnosis not present

## 2018-01-03 DIAGNOSIS — G9341 Metabolic encephalopathy: Secondary | ICD-10-CM | POA: Diagnosis present

## 2018-01-03 DIAGNOSIS — Z888 Allergy status to other drugs, medicaments and biological substances status: Secondary | ICD-10-CM | POA: Diagnosis not present

## 2018-01-03 DIAGNOSIS — J189 Pneumonia, unspecified organism: Secondary | ICD-10-CM | POA: Diagnosis present

## 2018-01-03 DIAGNOSIS — D630 Anemia in neoplastic disease: Secondary | ICD-10-CM | POA: Diagnosis not present

## 2018-01-03 DIAGNOSIS — Z85118 Personal history of other malignant neoplasm of bronchus and lung: Secondary | ICD-10-CM | POA: Diagnosis not present

## 2018-01-03 DIAGNOSIS — Z8249 Family history of ischemic heart disease and other diseases of the circulatory system: Secondary | ICD-10-CM

## 2018-01-03 DIAGNOSIS — Z8571 Personal history of Hodgkin lymphoma: Secondary | ICD-10-CM | POA: Diagnosis not present

## 2018-01-03 LAB — URINALYSIS, COMPLETE (UACMP) WITH MICROSCOPIC
BILIRUBIN URINE: NEGATIVE
GLUCOSE, UA: NEGATIVE mg/dL
Hgb urine dipstick: NEGATIVE
KETONES UR: NEGATIVE mg/dL
LEUKOCYTES UA: NEGATIVE
Nitrite: NEGATIVE
PROTEIN: NEGATIVE mg/dL
Specific Gravity, Urine: 1.01 (ref 1.005–1.030)
pH: 6 (ref 5.0–8.0)

## 2018-01-03 LAB — PHOSPHORUS: Phosphorus: 2.9 mg/dL (ref 2.5–4.6)

## 2018-01-03 LAB — BASIC METABOLIC PANEL
ANION GAP: 8 (ref 5–15)
BUN: 29 mg/dL — ABNORMAL HIGH (ref 8–23)
CHLORIDE: 105 mmol/L (ref 98–111)
CO2: 26 mmol/L (ref 22–32)
Calcium: 13.2 mg/dL (ref 8.9–10.3)
Creatinine, Ser: 1.43 mg/dL — ABNORMAL HIGH (ref 0.44–1.00)
GFR calc non Af Amer: 34 mL/min — ABNORMAL LOW (ref >60)
GFR, EST AFRICAN AMERICAN: 40 mL/min — AB (ref >60)
Glucose, Bld: 122 mg/dL — ABNORMAL HIGH (ref 70–99)
Potassium: 3.6 mmol/L (ref 3.5–5.1)
SODIUM: 139 mmol/L (ref 135–145)

## 2018-01-03 LAB — CBC
HCT: 32.9 % — ABNORMAL LOW (ref 35.0–47.0)
HEMOGLOBIN: 11.1 g/dL — AB (ref 12.0–16.0)
MCH: 30.7 pg (ref 26.0–34.0)
MCHC: 33.6 g/dL (ref 32.0–36.0)
MCV: 91.4 fL (ref 80.0–100.0)
Platelets: 68 10*3/uL — ABNORMAL LOW (ref 150–440)
RBC: 3.6 MIL/uL — AB (ref 3.80–5.20)
RDW: 17.6 % — ABNORMAL HIGH (ref 11.5–14.5)
WBC: 2.8 10*3/uL — AB (ref 3.6–11.0)

## 2018-01-03 LAB — LIPASE, BLOOD: Lipase: 26 U/L (ref 11–51)

## 2018-01-03 LAB — HEPATIC FUNCTION PANEL
ALT: 26 U/L (ref 0–44)
AST: 41 U/L (ref 15–41)
Albumin: 3.7 g/dL (ref 3.5–5.0)
Alkaline Phosphatase: 216 U/L — ABNORMAL HIGH (ref 38–126)
BILIRUBIN DIRECT: 0.4 mg/dL — AB (ref 0.0–0.2)
BILIRUBIN TOTAL: 1.4 mg/dL — AB (ref 0.3–1.2)
Indirect Bilirubin: 1 mg/dL — ABNORMAL HIGH (ref 0.3–0.9)
Total Protein: 6.9 g/dL (ref 6.5–8.1)

## 2018-01-03 LAB — MAGNESIUM: MAGNESIUM: 1.6 mg/dL — AB (ref 1.7–2.4)

## 2018-01-03 LAB — TROPONIN I: TROPONIN I: 0.03 ng/mL — AB (ref <0.03)

## 2018-01-03 LAB — GLUCOSE, CAPILLARY
GLUCOSE-CAPILLARY: 69 mg/dL — AB (ref 70–99)
Glucose-Capillary: 87 mg/dL (ref 70–99)

## 2018-01-03 MED ORDER — MIRTAZAPINE 15 MG PO TABS
15.0000 mg | ORAL_TABLET | Freq: Every day | ORAL | Status: DC
  Administered 2018-01-03 – 2018-01-06 (×4): 15 mg via ORAL
  Filled 2018-01-03 (×4): qty 1

## 2018-01-03 MED ORDER — ACETAMINOPHEN 650 MG RE SUPP: 650.0000 mg | Freq: Four times a day (QID) | RECTAL | Status: DC | PRN

## 2018-01-03 MED ORDER — VITAMIN B-12 1000 MCG PO TABS
1000.0000 ug | ORAL_TABLET | Freq: Every day | ORAL | Status: DC
  Administered 2018-01-04 – 2018-01-07 (×4): 1000 ug via ORAL
  Filled 2018-01-03 (×4): qty 1

## 2018-01-03 MED ORDER — ONDANSETRON HCL 4 MG/2ML IJ SOLN: 4.0000 mg | Freq: Four times a day (QID) | INTRAMUSCULAR | Status: DC | PRN

## 2018-01-03 MED ORDER — ACETAMINOPHEN 325 MG PO TABS
650.0000 mg | ORAL_TABLET | Freq: Four times a day (QID) | ORAL | Status: DC | PRN
  Administered 2018-01-04 – 2018-01-06 (×2): 650 mg via ORAL
  Filled 2018-01-03 (×2): qty 2

## 2018-01-03 MED ORDER — ATORVASTATIN CALCIUM 20 MG PO TABS
10.0000 mg | ORAL_TABLET | ORAL | Status: DC
  Administered 2018-01-04 – 2018-01-05 (×2): 10 mg via ORAL
  Filled 2018-01-03 (×2): qty 1

## 2018-01-03 MED ORDER — BENAZEPRIL HCL 10 MG PO TABS
10.0000 mg | ORAL_TABLET | Freq: Every day | ORAL | Status: DC
  Administered 2018-01-04 – 2018-01-07 (×4): 10 mg via ORAL
  Filled 2018-01-03 (×5): qty 1

## 2018-01-03 MED ORDER — MAGNESIUM SULFATE 2 GM/50ML IV SOLN
2.0000 g | Freq: Once | INTRAVENOUS | Status: AC
  Administered 2018-01-03: 2 g via INTRAVENOUS
  Filled 2018-01-03: qty 50

## 2018-01-03 MED ORDER — SODIUM CHLORIDE 0.9 % IV BOLUS
1000.0000 mL | Freq: Once | INTRAVENOUS | Status: AC
  Administered 2018-01-03: 1000 mL via INTRAVENOUS

## 2018-01-03 MED ORDER — ENOXAPARIN SODIUM 30 MG/0.3ML ~~LOC~~ SOLN
30.0000 mg | SUBCUTANEOUS | Status: DC
  Administered 2018-01-03 – 2018-01-05 (×3): 30 mg via SUBCUTANEOUS
  Filled 2018-01-03 (×3): qty 0.3

## 2018-01-03 MED ORDER — AMOXICILLIN 250 MG/5ML PO SUSR
875.0000 mg | Freq: Two times a day (BID) | ORAL | Status: DC
  Administered 2018-01-03 – 2018-01-05 (×4): 875 mg via ORAL
  Filled 2018-01-03 (×10): qty 20

## 2018-01-03 MED ORDER — SODIUM CHLORIDE 0.9 % IV SOLN
INTRAVENOUS | Status: DC
  Administered 2018-01-03 – 2018-01-04 (×2): via INTRAVENOUS

## 2018-01-03 MED ORDER — CARVEDILOL 3.125 MG PO TABS
6.2500 mg | ORAL_TABLET | Freq: Two times a day (BID) | ORAL | Status: DC
  Administered 2018-01-03 – 2018-01-07 (×8): 6.25 mg via ORAL
  Filled 2018-01-03 (×8): qty 2

## 2018-01-03 MED ORDER — ALBUTEROL SULFATE (2.5 MG/3ML) 0.083% IN NEBU: 2.5000 mg | INHALATION_SOLUTION | Freq: Four times a day (QID) | RESPIRATORY_TRACT | Status: DC | PRN

## 2018-01-03 MED ORDER — ONDANSETRON HCL 4 MG PO TABS: 4.0000 mg | ORAL_TABLET | Freq: Four times a day (QID) | ORAL | Status: DC | PRN

## 2018-01-03 NOTE — ED Provider Notes (Signed)
[facility] Emergency Department Provider Note  ____________________________________________   First MD Initiated Contact with Patient 01/03/18 1343     (approximate)  I have reviewed the triage vital signs and the nursing notes.   HISTORY  Chief Complaint Dizziness  5 exemption history limited by the patient's clinical condition  HPI Marie Lowe is a 77 y.o. female self presents the emergency department with 3 or 4 days of intermittent lightheadedness and "dizziness".  She said she does not feel "myself".  History largely obtained from the patient's daughter at bedside who said that her mother is clearly confused and is not acting "right".  The patient is a former smoker.  She has not smoked in 3 or 4 years.  She also formerly had Hodgkin's lymphoma although is currently in remission.  She denies fevers or chills.  She denies falls.  She denies chest pain shortness of breath abdominal pain nausea or vomiting.  Symptoms have been insidious onset are currently constant.  They are worsened when getting up and somewhat improved with sitting down.  Past Medical History:  Diagnosis Date  . Anemia   . Chest pain, unspecified   . Chronic kidney disease   . COPD (chronic obstructive pulmonary disease) ([place])   . Epistaxis   . Erosive gastropathy   . GERD (gastroesophageal reflux disease)   . High cholesterol   . Hodgkin's lymphoma ([name]) 2014   chemo  . Hodgkin's lymphoma ([place])   . Hypercalcemia   . Hypertension   . Lung cancer ([place]) 2015  . Pneumonia   . 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 Shortness of breath dyspnea     Patient Active Problem List   Diagnosis Date Noted  . Hypercalcemia 01/03/2018  . Neutropenia ([place]) 01/08/2017  . Elevated alkaline phosphatase level 01/08/2017  . COPD exacerbation ([place]) 07/12/2016  . Hyperlipidemia 07/12/2016  . Primary cancer of left upper lobe of lung ([place]) 01/24/2016  . Pulmonary nodule, right 01/24/2016  .  Hodgkin's lymphoma ([place]) 01/24/2016    Past Surgical History:  Procedure Laterality Date  . ABDOMINAL HYSTERECTOMY    . APPENDECTOMY    . COLONOSCOPY WITH PROPOFOL N/A 10/01/2015   Procedure: COLONOSCOPY WITH PROPOFOL;  Surgeon: Manya Silvas, MD;  Location: [facility] ENDOSCOPY;  Service: Endoscopy;  Laterality: N/A;  . NASAL SINUS SURGERY    . PORTA CATH REMOVAL N/A 07/21/2016   Procedure: Glori Luis Cath Removal;  Surgeon: Algernon Huxley, MD;  Location: [place] CV LAB;  Service: Cardiovascular;  Laterality: N/A;  . right lower lobe wedge resection      Prior to Admission medications   Medication Sig Start Date End Date Taking? Authorizing Provider  albuterol (PROVENTIL HFA;VENTOLIN HFA) 108 (90 BASE) MCG/ACT inhaler Inhale 2 puffs into the lungs every 6 (six) hours as needed for wheezing or shortness of breath.   Yes [provider]  amoxicillin (AMOXIL) 875 MG tablet Take 875 mg by mouth every 12 (twelve) hours. 12/27/17 01/06/18 Yes [provider]  antiseptic oral rinse (BIOTENE) LIQD 15 mLs by Mouth Rinse route as needed for dry mouth.   Yes [provider]  atorvastatin (LIPITOR) 10 MG tablet Take 10 mg by mouth every morning.  10/23/14  Yes [provider]  benazepril (LOTENSIN) 10 MG tablet Take 10 mg by mouth daily. 12/19/17  Yes [provider]  calcium carbonate (OS-CAL - DOSED IN MG OF ELEMENTAL CALCIUM) 1250 (500 Ca) MG tablet Take 1 tablet by mouth 2 (  two) times daily with a meal.    Yes [provider]  carvedilol (COREG) 6.25 MG tablet Take 6.25 mg by mouth 2 (two) times daily. 11/02/14  Yes [provider]  mirtazapine (REMERON) 15 MG tablet Take 15 mg by mouth at bedtime.  01/04/17  Yes [provider]  Multiple Vitamins-Minerals (MULTIVITAMIN PO) Take 1 tablet by mouth daily.    Yes [provider]  triamcinolone cream (KENALOG) 0.1 % Apply 1 application topically 2 (two) times daily. 12/18/17  Yes  [provider]  vitamin B-12 (CYANOCOBALAMIN) 1000 MCG tablet Take 1,000 mcg by mouth daily.   Yes [provider]    Allergies Hydrochlorothiazide  Family History  Problem Relation Age of Onset  . Diabetes Other   . CAD Other   . Cancer Other        colon cancer, breast cancer  . Kidney disease Other   . Breast cancer Maternal Aunt   . Prostate cancer Father   . Gastric cancer Mother   . Kidney cancer Neg Hx     Social History Social History   Tobacco Use  . Smoking status: Former Smoker    Packs/day: 1.00    Years: 18.00    Pack years: 18.00    Types: Cigarettes    Last attempt to quit: 2013    Years since quitting: 6.6  . Smokeless tobacco: Never Used  Substance Use Topics  . Alcohol use: Yes    Alcohol/week: 0.6 oz    Types: 1 Glasses of wine per week    Comment: Occassionaly  . Drug use: No    Review of Systems Constitutional: No fever/chills Eyes: No visual changes. ENT: No sore throat. Cardiovascular: Denies chest pain. Respiratory: Denies shortness of breath. Gastrointestinal: No abdominal pain.  No nausea, no vomiting.  No diarrhea.  No constipation. Genitourinary: Negative for dysuria. Musculoskeletal: Negative for back pain. Skin: Negative for rash. Neurological: Negative for headaches, focal weakness or numbness.   ____________________________________________   PHYSICAL EXAM:  VITAL SIGNS: ED Triage Vitals [01/03/18 1254]  Enc Vitals Group     BP (!) 151/71     Pulse Rate 92     Resp 16     Temp 98.6 F (37 C)     Temp Source Oral     SpO2 94 %     Weight 120 lb (54.4 kg)     Height 5\' 4"  (1.626 m)     Head Circumference      Peak Flow      Pain Score 0     Pain Loc      Pain Edu?      Excl. in [place]?     Constitutional: Alert and oriented x4 speaks slowly and methodically although pleasant cooperative and nontoxic Eyes: PERRL EOMI. midrange and brisk Head: Atraumatic. Nose: No  congestion/rhinnorhea. Mouth/Throat: No trismus Neck: No stridor.   Cardiovascular: Normal rate, regular rhythm. Grossly normal heart sounds.  Good peripheral circulation. Respiratory: Normal respiratory effort.  No retractions. Lungs CTAB and moving good air Gastrointestinal: Soft nontender Musculoskeletal: No lower extremity edema   Neurologic:  Normal speech and language. No gross focal neurologic deficits are appreciated. Skin:  Skin is warm, dry and intact. No rash noted. Psychiatric: Mood and affect are normal. Speech and behavior are normal.    ____________________________________________   DIFFERENTIAL includes but not limited to  Dehydration, acute coronary syndrome, arrhythmia, urinary tract infection ____________________________________________   LABS (all labs ordered are listed,  but only abnormal results are displayed)  Labs Reviewed  BASIC METABOLIC PANEL - Abnormal; Notable for the following components:      Result Value   Glucose, Bld 122 (*)    BUN 29 (*)    Creatinine, Ser 1.43 (*)    Calcium 13.2 (*)    GFR calc non Af Amer 34 (*)    GFR calc Af Amer 40 (*)    All other components within normal limits  CBC - Abnormal; Notable for the following components:   WBC 2.8 (*)    RBC 3.60 (*)    Hemoglobin 11.1 (*)    HCT 32.9 (*)    RDW 17.6 (*)    Platelets 68 (*)    All other components within normal limits  HEPATIC FUNCTION PANEL - Abnormal; Notable for the following components:   Alkaline Phosphatase 216 (*)    Total Bilirubin 1.4 (*)    Bilirubin, Direct 0.4 (*)    Indirect Bilirubin 1.0 (*)    All other components within normal limits  TROPONIN I - Abnormal; Notable for the following components:   Troponin I 0.03 (*)    All other components within normal limits  MAGNESIUM - Abnormal; Notable for the following components:   Magnesium 1.6 (*)    All other components within normal limits  PHOSPHORUS  LIPASE, BLOOD  URINALYSIS, COMPLETE (UACMP)  WITH MICROSCOPIC  PTH-RELATED PEPTIDE  PTH, INTACT AND CALCIUM  CALCIUM, IONIZED   Lab work reviewed by me with profound hypercalcemia and low platelets __________________________________________  EKG  ED ECG REPORT I, Darel Hong, the attending physician, personally viewed and interpreted this ECG.  Date: 01/03/2018 EKG Time:  Rate: 91 Rhythm: normal sinus rhythm QRS Axis: normal Intervals: Short QTC ST/T Wave abnormalities: normal Narrative Interpretation: no evidence of acute ischemia  ____________________________________________  RADIOLOGY  Chest x-ray reviewed by me with chronic changes but no acute disease ____________________________________________   PROCEDURES  Procedure(s) performed: no  .Critical Care Performed by: Darel Hong, MD Authorized by: Darel Hong, MD   Critical care provider statement:    Critical care time (minutes):  30   Critical care time was exclusive of:  Separately billable procedures and treating other patients   Critical care was necessary to treat or prevent imminent or life-threatening deterioration of the following conditions:  Metabolic crisis   Critical care was time spent personally by me on the following activities:  Development of treatment plan with patient or surrogate, discussions with consultants, evaluation of patient's response to treatment, examination of patient, obtaining history from patient or surrogate, ordering and performing treatments and interventions, ordering and review of laboratory studies, ordering and review of radiographic studies, pulse oximetry, re-evaluation of patient's condition and review of old charts    Critical Care performed: Yes  ____________________________________________   INITIAL IMPRESSION / ASSESSMENT AND PLAN / ED COURSE  Pertinent labs & imaging results that were available during my care of the patient were reviewed by me and considered in my medical decision making (see  chart for details).   As part of my medical decision making, I reviewed the following data within the [place] History obtained from family if available, nursing notes, old chart and ekg, as well as notes from prior ED visits.  The patient arrives quite confused.  Lab work shows a calcium of 13.2 raising concern for likely lung cancer.  I begun aggressive IV hydration and will add on a chest x-ray and broad labs including  parathyroid hormone and parathyroid like hormone.  At this point the patient requires inpatient admission for treatment of her life-threatening metabolic derangement.  The patient family understand and agree with the plan.      ____________________________________________   FINAL CLINICAL IMPRESSION(S) / ED DIAGNOSES  Final diagnoses:  Hypercalcemia      NEW MEDICATIONS STARTED DURING THIS VISIT:  New Prescriptions   No medications on file     Note:  This document was prepared using Dragon voice recognition software and may include unintentional dictation errors.     Darel Hong, MD 01/03/18 [id number]

## 2018-01-03 NOTE — ED Notes (Signed)
Date and time results received: 01/03/18 1515   Test: Trop Critical Value: 0.03

## 2018-01-03 NOTE — ED Notes (Signed)
Labs sent at this time. Per Lab the tubes that were needed.

## 2018-01-03 NOTE — Progress Notes (Signed)
   [place] [facility] Hospital Day: 0 days Marie Lowe is a 77 y.o. female presenting with Dizziness .   Advance care planning discussed with patient, her husband and her daughter at bedside. All questions in regards to overall condition and expected prognosis answered.  Patient was alert and oriented during the conversation and decided that is she suffers cardiac arrest- she does not want to be resuscitated, daughter at bedside asked patient is she sure and patient confirmed her decision. Husband is aphasic, but did not oppose. The decision was made to change her current code status to DNR  CODE STATUS: DNR Time spent: 18 minutes

## 2018-01-03 NOTE — ED Notes (Signed)
Pt resting in bed pt is able to respond to questions at this time. Pt is transported to xray at this time.

## 2018-01-03 NOTE — Progress Notes (Signed)
   01/03/18 1845  Clinical Encounter Type  Visited With Patient and family together  Visit Type Initial;Spiritual support  Referral From Nurse  Consult/Referral To Chaplain  Spiritual Encounters  Spiritual Needs Brochure;Emotional   Marie Lowe received an OR to educate the patient and her family on the AD process. I left the paperwork with the patient to be completed if desired. The family wanted to discuss the AD together. I will follow up if needed.

## 2018-01-03 NOTE — H&P (Signed)
[facility] [place] NAME: Marie Lowe    MR#:  [id number]  DATE OF BIRTH:  [birth year]  DATE OF ADMISSION:  01/03/2018  PRIMARY CARE PHYSICIAN: Marinda Elk, MD   REQUESTING/REFERRING PHYSICIAN: Dr. Darel Hong  CHIEF COMPLAINT:   Chief Complaint  Patient presents with  . Dizziness    HISTORY OF PRESENT ILLNESS:  Birttany Lowe  is a 77 y.o. 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 Patient lives at home with her husband, ambulates with a cane.  For the last week she has been having worsening shortness of breath and cough.  She was started on amoxicillin last week for possible bronchitis or sinusitis.  She has gotten progressively weak in the last 5 days.  When daughter came to visit her today, she was also noted to be intermittently confused and so brought her to the emergency room.  She has a calcium of 13.2 here, she is being admitted for the same.  Urine analysis is pending.  PAST MEDICAL HISTORY:   Past Medical History:  Diagnosis Date  . Anemia   . Chest pain, unspecified   . Chronic kidney disease   . COPD (chronic obstructive pulmonary disease) ([place])   . Epistaxis   . Erosive gastropathy   . GERD (gastroesophageal reflux disease)   . High cholesterol   . Hodgkin's lymphoma ([place]) 2014   chemo  . Hodgkin's lymphoma ([place])   . Hypercalcemia   . Hypertension   . Lung cancer ([place]) 2015  . Pneumonia   . 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 Shortness of breath dyspnea     PAST SURGICAL HISTORY:   Past Surgical History:  Procedure Laterality Date  . ABDOMINAL HYSTERECTOMY    . APPENDECTOMY    . COLONOSCOPY  WITH PROPOFOL N/A 10/01/2015   Procedure: COLONOSCOPY WITH PROPOFOL;  Surgeon: Manya Silvas, MD;  Location: [facility] ENDOSCOPY;  Service: Endoscopy;  Laterality: N/A;  . NASAL SINUS SURGERY    . PORTA CATH REMOVAL N/A 07/21/2016   Procedure: Glori Luis Cath Removal;  Surgeon: Algernon Huxley, MD;  Location: [place] CV LAB;  Service: Cardiovascular;  Laterality: N/A;  . right lower lobe wedge resection      SOCIAL HISTORY:   Social History   Tobacco Use  . Smoking status: Former Smoker    Packs/day: 1.00    Years: 18.00    Pack years: 18.00    Types: Cigarettes    Last attempt to quit: 2013    Years since quitting: 6.6  . Smokeless tobacco: Never Used  Substance Use Topics  . Alcohol use: Yes    Alcohol/week: 0.6 oz    Types: 1 Glasses of wine per week    Comment: Occassionaly    FAMILY HISTORY:   Family History  Problem Relation Age of Onset  . Diabetes Other   . CAD Other   . Cancer Other        colon cancer, breast cancer  . Kidney disease Other   . Breast cancer Maternal Aunt   . Prostate cancer Father   . Gastric cancer Mother   . Kidney cancer Neg Hx     DRUG ALLERGIES:  Allergies  Allergen Reactions  . Hydrochlorothiazide Other (See Comments)    Elevation of creatinine Patient doesn't remember what reaction she had to it.    REVIEW OF SYSTEMS:   Review of Systems  Constitutional: Positive for malaise/fatigue and weight loss. Negative for chills and fever.  HENT: Negative for ear discharge, ear pain, hearing loss, nosebleeds and tinnitus.   Eyes: Negative for blurred vision, double vision and photophobia.  Respiratory: Positive for cough and shortness of breath. Negative for hemoptysis and wheezing.   Cardiovascular: Negative for chest pain, palpitations, orthopnea and leg swelling.  Gastrointestinal: Negative for abdominal pain, constipation, diarrhea, heartburn, melena, nausea and vomiting.  Genitourinary: Negative for dysuria, frequency, hematuria and  urgency.  Musculoskeletal: Negative for back pain, myalgias and neck pain.  Skin: Negative for rash.  Neurological: Negative for dizziness, tingling, tremors, sensory change, speech change, focal weakness and headaches.  Endo/Heme/Allergies: Does not bruise/bleed easily.  Psychiatric/Behavioral: Negative for depression.       Confusion    MEDICATIONS AT HOME:   Prior to Admission medications   Medication Sig Start Date End Date Taking? Authorizing Provider  albuterol (PROVENTIL HFA;VENTOLIN HFA) 108 (90 BASE) MCG/ACT inhaler Inhale 2 puffs into the lungs every 6 (six) hours as needed for wheezing or shortness of breath.   Yes [provider]  amoxicillin (AMOXIL) 875 MG tablet Take 875 mg by mouth every 12 (twelve) hours. 12/27/17 01/06/18 Yes [provider]  antiseptic oral rinse (BIOTENE) LIQD 15 mLs by Mouth Rinse route as needed for dry mouth.   Yes [provider]  atorvastatin (LIPITOR) 10 MG tablet Take 10 mg by mouth every morning.  10/23/14  Yes [provider]  benazepril (LOTENSIN) 10 MG tablet Take 10 mg by mouth daily. 12/19/17  Yes [provider]  calcium carbonate (OS-CAL - DOSED IN MG OF ELEMENTAL CALCIUM) 1250 (500 Ca) MG tablet Take 1 tablet by mouth 2 (two) times daily with a meal.    Yes [provider]  carvedilol (COREG) 6.25 MG tablet Take 6.25 mg by mouth 2 (two) times daily. 11/02/14  Yes [provider]  mirtazapine (REMERON) 15 MG tablet Take 15 mg by mouth at bedtime.  01/04/17  Yes [provider]  Multiple Vitamins-Minerals (MULTIVITAMIN PO) Take 1 tablet by mouth daily.    Yes [provider]  triamcinolone cream (KENALOG) 0.1 % Apply 1 application topically 2 (two) times daily. 12/18/17  Yes [provider]  vitamin B-12 (CYANOCOBALAMIN) 1000 MCG tablet Take 1,000 mcg by mouth daily.   Yes [provider]      VITAL SIGNS:  Blood pressure (!) 151/71, pulse 92,  temperature 98.6 F (37 C), temperature source Oral, resp. rate 16, height 5\' 4"  (1.626 m), weight 54.4 kg (120 lb), SpO2 94 %.  PHYSICAL EXAMINATION:   Physical Exam  GENERAL:  77 y.o.-year-old ill nourished patient lying in the bed with no acute distress.  EYES: Pupils equal, round, reactive to light and accommodation. No scleral icterus. Extraocular muscles intact.  HEENT: Head atraumatic, normocephalic. Oropharynx and nasopharynx clear.  NECK:  Supple, no jugular venous distention. No thyroid enlargement, no tenderness.  LUNGS: Normal breath sounds bilaterally, no  rales,rhonchi or crepitation. No use of accessory muscles of respiration. Bibasilar scattered wheezing at the bases CARDIOVASCULAR: S1, S2 normal. No  rubs, or gallops. 2/6 systolic murmur present ABDOMEN: Soft, nontender, nondistended. Bowel sounds present. No organomegaly or mass.  EXTREMITIES: No pedal edema, cyanosis, or clubbing.  NEUROLOGIC:  Cranial nerves II through XII are intact. Muscle strength 5/5 in all extremities. Sensation intact. Gait not checked.  PSYCHIATRIC: The patient is alert and oriented x 3.  SKIN: No obvious rash, lesion, or ulcer.   LABORATORY PANEL:   CBC Recent Labs  Lab 01/03/18 1259  WBC 2.8*  HGB 11.1*  HCT 32.9*  PLT 68*   ------------------------------------------------------------------------------------------------------------------  Chemistries  Recent Labs  Lab 01/03/18 1259 01/03/18 1424  NA 139  --   K 3.6  --   CL 105  --   CO2 26  --   GLUCOSE 122*  --   BUN 29*  --   CREATININE 1.43*  --   CALCIUM 13.2*  --   MG  --  1.6*  AST  --  41  ALT  --  26  ALKPHOS  --  216*  BILITOT  --  1.4*   ------------------------------------------------------------------------------------------------------------------  Cardiac Enzymes Recent Labs  Lab 01/03/18 1424  TROPONINI 0.03*    ------------------------------------------------------------------------------------------------------------------  RADIOLOGY:  Dg Chest 2 View  Result Date: 01/03/2018 CLINICAL DATA:  77 year old female with intermittent dizziness and lightheadedness since Thursday. 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 EXAM: CHEST - 2 VIEW COMPARISON:  Most recent prior imaging is a PET-CT from 09/15/2017 FINDINGS: Mild cardiomegaly. Atherosclerotic calcifications are noted in the transverse aorta. The mediastinal contours are within normal limits. Biapical pleuroparenchymal scarring. Overall hyperinflation with hyperlucency in the upper lungs consistent with underlying emphysema. Areas of linear airspace opacity are present in the lingula and right lower lobe. Comparing across modalities to the prior PET-CT demonstrates a similar distribution of pleuroparenchymal scarring. No definite new airspace consolidation, pleural effusion or pneumothorax. No acute osseous abnormality. IMPRESSION: Comparing across modalities to the prior PET-CT dated 09/15/2017, similar appearance of the lungs with a combination of patchy and linear airspace opacities in the lingula, and in the upper and lower aspects of the right lower lobe. Findings may reflect chronic pleuroparenchymal scarring and/or chronic/indolent infection. Stable cardiomegaly. Aortic Atherosclerosis (ICD10-I70.0) and Emphysema (ICD10-J43.9). Electronically Signed   By: Jacqulynn Cadet M.D.   On: 01/03/2018 14:17    EKG:   Orders placed or performed during the hospital encounter of 01/03/18  . ED EKG  . ED EKG  . EKG 12-Lead  . EKG 12-Lead  . EKG 12-Lead  . EKG 12-Lead    IMPRESSION AND PLAN:   Lyndia Bury  is a 77 y.o. 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  1.  Hypercalcemia-history of borderline hypercalcemia with calcium levels around 10-11 in the past.  Currently at 13.2 -History of cancer.  Will get a CT chest -IV fluids, oncology consult-if does not improve, will need Zometa -PTH, PTH related peptide and ionized calcium and phosphorus levels are pending  2.  Acute encephalopathy-likely secondary to her hypercalcemia.  Check urine analysis as well.  Intermittent confusion, during my exam she is alert and oriented.  3.  Pancytopenia-previous history of leukopenia and borderline anemia.  Platelets also noted to be low this time.  Continue to monitor closely.  4.  History of lung cancer-patient has history of left lower lobe nodule that is being  monitored, previous biopsy confirmed squamous cell carcinoma and was treated with radiation only since it was stage I.  Also has right lower lobe nodule that was confirmed to be squamous cell carcinoma and finished radiation treatment for that as well.  Recent PET scan and April 2019 did not show any concerning findings. -Follow-up CT of the chest this admission due to hypercalcemia  5.  Hypertension-on carvedilol and benazepril.  6.  History of bronchitis/sinusitis-currently on amoxicillin, continue for 3 more days and stop  7.  DVT prophylaxis-on Lovenox   Physical therapy consulted.   All the records are reviewed and case discussed with ED provider. Management plans discussed with the patient, family and they are in agreement.  CODE STATUS: DNR  TOTAL TIME TAKING CARE OF THIS PATIENT: 50 minutes.    Jarah Pember M.D on 01/03/2018 at 3:52 PM  Between 7am to 6pm - Pager - [phone]  After 6pm go to [email or web address] - password [email or web address]  Office   [phone]  CC: Primary care physician; Marinda Elk, MD

## 2018-01-03 NOTE — ED Triage Notes (Signed)
Pt in via POV from home with complaints of intermittent dizziness/light headedness since Thursday.  Pt denies any other complaints.  Vitals WDL, NAD noted at this time.

## 2018-01-03 NOTE — Progress Notes (Signed)
Anticoagulation monitoring(Lovenox):  77 yo female ordered Lovenox 40 mg Q24h  Filed Weights   01/03/18 1254  Weight: 120 lb (54.4 kg)   BMI   Lab Results  Component Value Date   CREATININE 1.43 (H) 01/03/2018   CREATININE 1.10 (H) 01/02/2017   CREATININE 1.40 (H) 01/20/2016   Estimated Creatinine Clearance: 28.3 mL/min (A) (by C-G formula based on SCr of 1.43 mg/dL (H)). Hemoglobin & Hematocrit     Component Value Date/Time   HGB 11.1 (L) 01/03/2018 1259   HGB 10.6 (L) 09/19/2014 1009   HCT 32.9 (L) 01/03/2018 1259   HCT 32.1 (L) 09/19/2014 1009     Per Protocol for Patient with estCrcl < 30 ml/min and BMI < 40, will transition to Lovenox 30mg  Q24h.

## 2018-01-04 LAB — CALCIUM, IONIZED: Calcium, Ionized, Serum: 7.3 mg/dL — ABNORMAL HIGH (ref 4.5–5.6)

## 2018-01-04 LAB — BASIC METABOLIC PANEL
Anion gap: 5 (ref 5–15)
BUN: 26 mg/dL — ABNORMAL HIGH (ref 8–23)
CALCIUM: 11.3 mg/dL — AB (ref 8.9–10.3)
CO2: 27 mmol/L (ref 22–32)
Chloride: 108 mmol/L (ref 98–111)
Creatinine, Ser: 1.24 mg/dL — ABNORMAL HIGH (ref 0.44–1.00)
GFR, EST AFRICAN AMERICAN: 47 mL/min — AB (ref >60)
GFR, EST NON AFRICAN AMERICAN: 41 mL/min — AB (ref >60)
Glucose, Bld: 96 mg/dL (ref 70–99)
Potassium: 3.5 mmol/L (ref 3.5–5.1)
SODIUM: 140 mmol/L (ref 135–145)

## 2018-01-04 LAB — CBC
HCT: 29.5 % — ABNORMAL LOW (ref 35.0–47.0)
Hemoglobin: 10 g/dL — ABNORMAL LOW (ref 12.0–16.0)
MCH: 30.8 pg (ref 26.0–34.0)
MCHC: 33.8 g/dL (ref 32.0–36.0)
MCV: 91.2 fL (ref 80.0–100.0)
PLATELETS: 63 10*3/uL — AB (ref 150–440)
RBC: 3.24 MIL/uL — AB (ref 3.80–5.20)
RDW: 17.6 % — ABNORMAL HIGH (ref 11.5–14.5)
WBC: 2.4 10*3/uL — ABNORMAL LOW (ref 3.6–11.0)

## 2018-01-04 LAB — PTH, INTACT AND CALCIUM
Calcium, Total (PTH): 12.7 mg/dL — ABNORMAL HIGH (ref 8.7–10.3)
PTH: 10 pg/mL — ABNORMAL LOW (ref 15–65)

## 2018-01-04 NOTE — Evaluation (Signed)
Physical Therapy Evaluation Patient Details Name: Marie Lowe MRN: [id number] DOB: 07/05/1940 Today's Date: 01/04/2018   History of Present Illness  77 y.o. 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  Clinical Impression  Patient A&Ox3 at start of session, no complaints of pain. Patient reports living in 1 story home with husband, independent with assistance devices (ambulates with SPC). The patient demonstrated bed mobility and transfers with supervision/CGA. Patient demonstrates poor eccentric control during stand to sit transfer. Ambulated ~192ft with SPC and CGA with decreased gait speed, wide BOS, and drifts to R periodically. The patient also demonstrates generalized weakness of UE and LE, RLE>LLE, as well as decreased activity tolerance, balance, and endurance. The patient would benefit from further skilled PT to address these limitations to maximize independence, mobility, and safety.     Follow Up Recommendations Home health PT;Supervision for mobility/OOB    Equipment Recommendations  None recommended by PT(Pt has SPC )    Recommendations for Other Services       Precautions / Restrictions Precautions Precautions: Fall Restrictions Weight Bearing Restrictions: No      Mobility  Bed Mobility Overal bed mobility: Modified Independent                Transfers Overall transfer level: Needs assistance   Transfers: Sit to/from Stand Sit to Stand: Supervision;Min guard            Ambulation/Gait Ambulation/Gait assistance: Min guard Gait Distance (Feet): 100 Feet Assistive device: Straight cane       General Gait Details: decreased gait  speed, wide BOS, drifts to R periodically   Stairs            Wheelchair Mobility    Modified Rankin (Stroke Patients Only)       Balance Overall balance assessment: Needs assistance   Sitting balance-Leahy Scale: Fair       Standing balance-Leahy Scale: Fair                               Pertinent Vitals/Pain Pain Assessment: No/denies pain    Home Living Family/patient expects to be discharged to:: Private residence Living Arrangements: Spouse/significant other Available Help at Discharge: Family Type of Home: House Home Access: Level entry     Home Layout: One level Home Equipment: Kasandra Knudsen - single point      Prior Function Level of Independence: Independent with assistive device(s)         Comments: Patient ambulates with SPC, independent for ADLs, IADLs     Hand Dominance   Dominant Hand: Right    Extremity/Trunk Assessment   Upper Extremity Assessment Upper Extremity Assessment: Defer to OT evaluation;RUE deficits/detail;LUE deficits/detail RUE Deficits / Details: 3+/5 grossly LUE Deficits / Details: 3+/5 grossly    Lower Extremity Assessment Lower Extremity Assessment: Generalized weakness;RLE deficits/detail;LLE deficits/detail RLE Deficits / Details: 3/5 grossly LLE Deficits / Details: 3+/5 grossly       Communication   Communication: No difficulties  Cognition Arousal/Alertness: Awake/alert Behavior During Therapy: WFL for tasks assessed/performed Overall Cognitive Status: No family/caregiver present to determine baseline cognitive functioning  General Comments: Patient was A&Ox3, admitted for intermittent confusion, but unable to assess baseline with no family to confirm.      General Comments      Exercises     Assessment/Plan    PT Assessment Patient needs continued PT services  PT Problem List Decreased strength;Decreased range of motion;Decreased knowledge of  use of DME;Decreased activity tolerance;Decreased safety awareness;Decreased balance;Decreased knowledge of precautions;Decreased mobility       PT Treatment Interventions DME instruction;Balance training;Gait training;Neuromuscular re-education;Functional mobility training;Patient/family education;Therapeutic activities;Therapeutic exercise    PT Goals (Current goals can be found in the Care Plan section)  Acute Rehab PT Goals Patient Stated Goal: Patient wants to return home PT Goal Formulation: With patient Time For Goal Achievement: 01/18/18 Potential to Achieve Goals: Good    Frequency Min 2X/week   Barriers to discharge        Co-evaluation               AM-PAC PT "6 Clicks" Daily Activity  Outcome Measure Difficulty turning over in bed (including adjusting bedclothes, sheets and blankets)?: A Little Difficulty moving from lying on back to sitting on the side of the bed? : A Little Difficulty sitting down on and standing up from a chair with arms (e.g., wheelchair, bedside commode, etc,.)?: A Little Help needed moving to and from a bed to chair (including a wheelchair)?: A Little Help needed walking in hospital room?: A Little Help needed climbing 3-5 steps with a railing? : A Lot 6 Click Score: 17    End of Session Equipment Utilized During Treatment: Gait belt Activity Tolerance: Patient tolerated treatment well Patient left: in bed;with bed alarm set;with call bell/phone within reach Nurse Communication: Mobility status PT Visit Diagnosis: Unsteadiness on feet (R26.81);Other abnormalities of gait and mobility (R26.89);History of falling (Z91.81)    Time: 5537-4827 PT Time Calculation (min) (ACUTE ONLY): 24 min   Charges:   PT Evaluation $PT Eval Low Complexity: 1 Low PT Treatments $Therapeutic Activity: 8-22 mins       Lieutenant Diego PT, DPT 12:17 PM,01/04/18 [phone]

## 2018-01-05 ENCOUNTER — Inpatient Hospital Stay: Payer: Medicare Other

## 2018-01-05 ENCOUNTER — Encounter: Payer: Self-pay | Admitting: Radiology

## 2018-01-05 ENCOUNTER — Other Ambulatory Visit: Payer: Self-pay

## 2018-01-05 DIAGNOSIS — C8590 Non-Hodgkin lymphoma, unspecified, unspecified site: Secondary | ICD-10-CM

## 2018-01-05 DIAGNOSIS — Z8042 Family history of malignant neoplasm of prostate: Secondary | ICD-10-CM

## 2018-01-05 DIAGNOSIS — C3431 Malignant neoplasm of lower lobe, right bronchus or lung: Secondary | ICD-10-CM

## 2018-01-05 DIAGNOSIS — Z800 Family history of malignant neoplasm of digestive organs: Secondary | ICD-10-CM

## 2018-01-05 DIAGNOSIS — C3412 Malignant neoplasm of upper lobe, left bronchus or lung: Secondary | ICD-10-CM

## 2018-01-05 DIAGNOSIS — R918 Other nonspecific abnormal finding of lung field: Secondary | ICD-10-CM

## 2018-01-05 DIAGNOSIS — Z803 Family history of malignant neoplasm of breast: Secondary | ICD-10-CM

## 2018-01-05 DIAGNOSIS — D630 Anemia in neoplastic disease: Secondary | ICD-10-CM

## 2018-01-05 DIAGNOSIS — Z87891 Personal history of nicotine dependence: Secondary | ICD-10-CM

## 2018-01-05 DIAGNOSIS — D696 Thrombocytopenia, unspecified: Secondary | ICD-10-CM

## 2018-01-05 DIAGNOSIS — D72819 Decreased white blood cell count, unspecified: Secondary | ICD-10-CM

## 2018-01-05 LAB — BASIC METABOLIC PANEL
Anion gap: 6 (ref 5–15)
BUN: 23 mg/dL (ref 8–23)
CALCIUM: 11.4 mg/dL — AB (ref 8.9–10.3)
CO2: 26 mmol/L (ref 22–32)
Chloride: 109 mmol/L (ref 98–111)
Creatinine, Ser: 1.24 mg/dL — ABNORMAL HIGH (ref 0.44–1.00)
GFR calc non Af Amer: 41 mL/min — ABNORMAL LOW (ref >60)
GFR, EST AFRICAN AMERICAN: 47 mL/min — AB (ref >60)
Glucose, Bld: 104 mg/dL — ABNORMAL HIGH (ref 70–99)
Potassium: 3.8 mmol/L (ref 3.5–5.1)
SODIUM: 141 mmol/L (ref 135–145)

## 2018-01-05 MED ORDER — SODIUM CHLORIDE 0.9 % IV SOLN
INTRAVENOUS | Status: DC
  Administered 2018-01-05 – 2018-01-07 (×4): via INTRAVENOUS

## 2018-01-05 MED ORDER — SODIUM CHLORIDE 0.9 % IV SOLN
500.0000 mg | Freq: Every day | INTRAVENOUS | Status: DC
  Administered 2018-01-05 – 2018-01-06 (×2): 500 mg via INTRAVENOUS
  Filled 2018-01-05 (×3): qty 500

## 2018-01-05 MED ORDER — SODIUM CHLORIDE 0.9 % IV SOLN
1.0000 g | Freq: Every day | INTRAVENOUS | Status: DC
  Administered 2018-01-05 – 2018-01-06 (×2): 1 g via INTRAVENOUS
  Filled 2018-01-05: qty 10
  Filled 2018-01-05 (×2): qty 1

## 2018-01-05 MED ORDER — IOHEXOL 300 MG/ML  SOLN
75.0000 mL | Freq: Once | INTRAMUSCULAR | Status: AC | PRN
  Administered 2018-01-05: 75 mL via INTRAVENOUS

## 2018-01-05 NOTE — Progress Notes (Addendum)
Physical Therapy Treatment Patient Details Name: Marie Lowe MRN: [id number] DOB: May 20, 1941 Today's Date: 01/05/2018    History of Present Illness 77 y.o. 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    PT Comments    Patient alert and agreeable to PT at start of session, no complaints of pain. Patient demonstrated bed mobility mod I, able to sit EOB for several minutes to perform therapeutic exercises with demonstration from PT, intermittent verbal cues to improve quality of movements. Patient ambulated in hall with [facility], [place] for ~159ft, demonstrated increased unsteadiness compared to previous session.1 LOB noted but patient able to self correct with CGA from PT. Patient and PT discussed importance of safety while walking to prevent falls, patient agreeable to use RW. However, patient unable to describe walker at home, may benefit from RW to ensure safety.     Follow Up Recommendations  Home health PT;Supervision for mobility/OOB     Equipment Recommendations  Rolling walker with 5" wheels;Other (comment)(Patient unable to report what kind of walker she has at home, no family at bedside to confirm)    Recommendations for Other Services       Precautions / Restrictions Precautions Precautions: Fall Restrictions Weight Bearing Restrictions: No    Mobility  Bed Mobility Overal bed mobility: Modified Independent                Transfers Overall transfer level: Needs assistance   Transfers: Sit to/from Stand Sit to Stand: Min guard            Ambulation/Gait Ambulation/Gait assistance: Min guard Gait Distance (Feet): 110 Feet Assistive device:  Straight cane       General Gait Details: Patient demonstrated increased unsteadiness this session, 1 LOB but patient able to self correct.   Stairs             Wheelchair Mobility    Modified Rankin (Stroke Patients Only)       Balance Overall balance assessment: Needs assistance   Sitting balance-Leahy Scale: Good       Standing balance-Leahy Scale: Fair                              Cognition Arousal/Alertness: Awake/alert Behavior During Therapy: WFL for tasks assessed/performed Overall Cognitive Status: No family/caregiver present to determine baseline cognitive functioning                                        Exercises General Exercises - Lower Extremity Long Arc Quad: AROM;Both;20 reps;Strengthening Heel Slides: Seated;AROM;Strengthening;Both;20 reps Hip Flexion/Marching: AROM;Strengthening;Both;20 reps Toe Raises: Strengthening;Both;20 reps;AROM;Seated Heel Raises: Strengthening;Both;20 reps;AROM;Seated    General Comments        Pertinent Vitals/Pain Pain Assessment: No/denies pain    Home Living                      Prior Function            PT Goals (current goals can now be found in the care plan section) Acute Rehab PT Goals Patient Stated Goal: Patient  wants to return home Progress towards PT goals: Progressing toward goals    Frequency    Min 2X/week      PT Plan Current plan remains appropriate    Co-evaluation              AM-PAC PT "6 Clicks" Daily Activity  Outcome Measure  Difficulty turning over in bed (including adjusting bedclothes, sheets and blankets)?: A Little Difficulty moving from lying on back to sitting on the side of the bed? : A Little Difficulty sitting down on and standing up from a chair with arms (e.g., wheelchair, bedside commode, etc,.)?: A Little Help needed moving to and from a bed to chair (including a wheelchair)?: A Little Help needed walking in  hospital room?: A Little Help needed climbing 3-5 steps with a railing? : A Lot 6 Click Score: 17    End of Session Equipment Utilized During Treatment: Gait belt;Oxygen Activity Tolerance: Patient tolerated treatment well Patient left: in bed;with bed alarm set;with call bell/phone within reach Nurse Communication: Mobility status PT Visit Diagnosis: Unsteadiness on feet (R26.81);Other abnormalities of gait and mobility (R26.89);History of falling (Z91.81)     Time: 1350-1420 PT Time Calculation (min) (ACUTE ONLY): 30 min  Charges:  $Therapeutic Exercise: 8-22 mins $Therapeutic Activity: 8-22 mins                    Lieutenant Diego PT, DPT 2:25 PM,01/05/18 [phone]

## 2018-01-05 NOTE — Consult Note (Signed)
[name]  [phone] [phone]  ID: Marie Lowe OB: [birth year]  MR#: [id number]  TOI#:[id number]  Patient Care Team: Marinda Elk, MD as PCP - General (Physician Assistant)  CHIEF COMPLAINT: Hypercalcemia, confusion, history of lung cancer and Hodgkin's lymphoma.  INTERVAL HISTORY: Patient is a 77 year old female with a history of recurrent lung cancer as well as Hodgkin's lymphoma.  She was recently admitted through the emergency room after being evaluated for confusion and have been a calcium level of greater than 13.0.  The currently, patient feels well and appears at her baseline.  She has no neurologic complaints.  She denies any recent fevers or illnesses.  She has a fair appetite, but denies weight loss.  She has no chest pain, cough, hemoptysis, or shortness of breath.  She denies any nausea, vomiting, constipation, or diarrhea.  She has no urinary complaints.  Patient offers no further specific complaints today.    REVIEW OF SYSTEMS:   Review of Systems  Constitutional: Negative.  Negative for fever, malaise/fatigue and weight loss.  Respiratory: Negative.  Negative for cough and shortness of breath.   Cardiovascular: Negative.  Negative for chest pain and leg swelling.  Gastrointestinal: Negative.  Negative for abdominal pain.  Genitourinary: Negative.  Negative for dysuria.  Musculoskeletal: Negative.   Skin: Negative.  Negative for rash.  Neurological: Negative.  Negative for sensory change, focal weakness, weakness and headaches.  Psychiatric/Behavioral: Negative.  The patient is not nervous/anxious.     As per HPI. Otherwise, a complete review of systems is negative.  PAST MEDICAL HISTORY: Past Medical History:  Diagnosis Date  . Anemia   . Chest pain, unspecified   . Chronic kidney disease   . COPD (chronic obstructive pulmonary disease) ([place])   . Epistaxis   . Erosive gastropathy   . GERD (gastroesophageal  reflux disease)   . High cholesterol   . Hodgkin's lymphoma ([place]) 2014   chemo  . Hodgkin's lymphoma ([place])   . Hypercalcemia   . Hypertension   . Lung cancer ([place]) 2015  . Pneumonia   . 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 Shortness of breath dyspnea     PAST SURGICAL HISTORY: Past Surgical History:  Procedure Laterality Date  . ABDOMINAL HYSTERECTOMY    . APPENDECTOMY    . COLONOSCOPY WITH PROPOFOL N/A 10/01/2015   Procedure: COLONOSCOPY WITH PROPOFOL;  Surgeon: Manya Silvas, MD;  Location: [facility] ENDOSCOPY;  Service: Endoscopy;  Laterality: N/A;  . NASAL SINUS SURGERY    . PORTA CATH REMOVAL N/A 07/21/2016   Procedure: Glori Luis Cath Removal;  Surgeon: Algernon Huxley, MD;  Location: [place] [facility] LAB;  Service: Cardiovascular;  Laterality: N/A;  . right lower lobe wedge resection      FAMILY HISTORY: Family History  Problem Relation Age of Onset  . Diabetes Other   . CAD Other   . Cancer Other        colon cancer, breast cancer  . Kidney disease Other   . Breast cancer Maternal Aunt   . Prostate cancer Father   . Gastric cancer Mother   . Kidney cancer Neg Hx     ADVANCED DIRECTIVES (Y/N):  @ADVDIR @  HEALTH MAINTENANCE: Social History   Tobacco Use  . Smoking status: Former Smoker    Packs/day: 1.00    Years: 18.00    Pack years: 18.00    Types: Cigarettes    Last attempt to quit: 2013    Years since quitting:  6.6  . Smokeless tobacco: Never Used  Substance Use Topics  . Alcohol use: Yes    Alcohol/week: 1.0 standard drinks    Types: 1 Glasses of wine per week    Comment: Occassionaly  . Drug use: No     Colonoscopy:  PAP:  Bone density:  Lipid panel:  Allergies  Allergen Reactions  . Hydrochlorothiazide Other (See Comments)    Elevation of creatinine Patient doesn't remember what reaction she had to it.    Current Facility-Administered Medications  Medication Dose Route Frequency Provider Last Rate Last Dose  . 0.9 %  sodium chloride infusion    Intravenous Continuous Gladstone Lighter, MD 75 mL/hr at 01/04/18 0516    . 0.9 %  sodium chloride infusion   Intravenous Continuous Vaughan Basta, MD      . acetaminophen (TYLENOL) tablet 650 mg  650 mg Oral Q6H PRN Gladstone Lighter, MD   650 mg at 01/04/18 1257   Or  . acetaminophen (TYLENOL) suppository 650 mg  650 mg Rectal Q6H PRN Gladstone Lighter, MD      . albuterol (PROVENTIL) (2.5 MG/3ML) 0.083% nebulizer solution 2.5 mg  2.5 mg Inhalation Q6H PRN Gladstone Lighter, MD      . atorvastatin (LIPITOR) tablet 10 mg  10 mg Oral Clyda Hurdle, Hart Rochester, MD   10 mg at 01/05/18 0917  . azithromycin (ZITHROMAX) 500 mg in sodium chloride 0.9 % 250 mL IVPB  500 mg Intravenous q1800 Vaughan Basta, MD      . benazepril (LOTENSIN) tablet 10 mg  10 mg Oral Daily Gladstone Lighter, MD   10 mg at 01/05/18 1302  . carvedilol (COREG) tablet 6.25 mg  6.25 mg Oral BID Gladstone Lighter, MD   6.25 mg at 01/05/18 0915  . cefTRIAXone (ROCEPHIN) 1 g in sodium chloride 0.9 % 100 mL IVPB  1 g Intravenous q1800 Vaughan Basta, MD      . enoxaparin (LOVENOX) injection 30 mg  30 mg Subcutaneous Q24H Gladstone Lighter, MD   30 mg at 01/04/18 2249  . mirtazapine (REMERON) tablet 15 mg  15 mg Oral QHS Gladstone Lighter, MD   15 mg at 01/04/18 2249  . ondansetron (ZOFRAN) tablet 4 mg  4 mg Oral Q6H PRN Gladstone Lighter, MD       Or  . ondansetron (ZOFRAN) injection 4 mg  4 mg Intravenous Q6H PRN Gladstone Lighter, MD      . vitamin B-12 (CYANOCOBALAMIN) tablet 1,000 mcg  1,000 mcg Oral Daily Gladstone Lighter, MD   1,000 mcg at 01/05/18 0915   Facility-Administered Medications Ordered in Other Encounters  Medication Dose Route Frequency Provider Last Rate Last Dose  . heparin lock flush 100 unit/mL  500 Units Intravenous Once Lloyd Huger, MD      . sodium chloride 0.9 % injection 10 mL  10 mL Intravenous PRN Lloyd Huger, MD   10 mL at 02/09/15 0844     OBJECTIVE: Vitals:   01/05/18 0352 01/05/18 1528  BP: (!) 164/71 (!) 161/79  Pulse: 89 91  Resp: 18   Temp: 98.2 F (36.8 C) 97.9 F (36.6 C)  SpO2: 97% 100%     Body mass index is 22.47 kg/m.    ECOG FS:1 - Symptomatic but completely ambulatory  General: Well-developed, well-nourished, no acute distress. Eyes: Pink conjunctiva, anicteric sclera. HEENT: Normocephalic, moist mucous membranes, clear oropharnyx. Lungs: Clear to auscultation bilaterally. Heart: Regular rate and rhythm. No rubs, murmurs, or gallops. Abdomen: Soft, nontender, nondistended. No  organomegaly noted, normoactive bowel sounds. Musculoskeletal: No edema, cyanosis, or clubbing. Neuro: Alert, answering all questions appropriately. Cranial nerves grossly intact. Skin: No rashes or petechiae noted. Psych: Normal affect. Lymphatics: No cervical, calvicular, axillary or inguinal LAD.   LAB RESULTS:  Lab Results  Component Value Date   NA 141 01/05/2018   K 3.8 01/05/2018   CL 109 01/05/2018   CO2 26 01/05/2018   GLUCOSE 104 (H) 01/05/2018   BUN 23 01/05/2018   CREATININE 1.24 (H) 01/05/2018   CALCIUM 11.4 (H) 01/05/2018   PROT 6.9 01/03/2018   ALBUMIN 3.7 01/03/2018   AST 41 01/03/2018   ALT 26 01/03/2018   ALKPHOS 216 (H) 01/03/2018   BILITOT 1.4 (H) 01/03/2018   GFRNONAA 41 (L) 01/05/2018   GFRAA 47 (L) 01/05/2018    Lab Results  Component Value Date   WBC 2.4 (L) 01/04/2018   NEUTROABS 0.6 (L) 01/02/2017   HGB 10.0 (L) 01/04/2018   HCT 29.5 (L) 01/04/2018   MCV 91.2 01/04/2018   PLT 63 (L) 01/04/2018     STUDIES: Dg Chest 2 View  Result Date: 01/03/2018 CLINICAL DATA:  77 year old female with intermittent dizziness and lightheadedness since Thursday. 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 EXAM: CHEST - 2 VIEW COMPARISON:  Most recent prior imaging is a PET-CT from 09/15/2017 FINDINGS: Mild cardiomegaly.  Atherosclerotic calcifications are noted in the transverse aorta. The mediastinal contours are within normal limits. Biapical pleuroparenchymal scarring. Overall hyperinflation with hyperlucency in the upper lungs consistent with underlying emphysema. Areas of linear airspace opacity are present in the lingula and right lower lobe. Comparing across modalities to the prior PET-CT demonstrates a similar distribution of pleuroparenchymal scarring. No definite new airspace consolidation, pleural effusion or pneumothorax. No acute osseous abnormality. IMPRESSION: Comparing across modalities to the prior PET-CT dated 09/15/2017, similar appearance of the lungs with a combination of patchy and linear airspace opacities in the lingula, and in the upper and lower aspects of the right lower lobe. Findings may reflect chronic pleuroparenchymal scarring and/or chronic/indolent infection. Stable cardiomegaly. Aortic Atherosclerosis (ICD10-I70.0) and Emphysema (ICD10-J43.9). Electronically Signed   By: Jacqulynn Cadet M.D.   On: 01/03/2018 14:17   Ct Chest W Contrast  Result Date: 01/05/2018 CLINICAL DATA:  Hypercalcemia, history of lung cancer, Hodgkin's lymphoma, COPD, hypertension, former smoker EXAM: CT CHEST WITH CONTRAST TECHNIQUE: Multidetector CT imaging of the chest was performed during intravenous contrast administration. Sagittal and coronal MPR images reconstructed from axial data set. CONTRAST:  74mL OMNIPAQUE IOHEXOL 300 MG/ML SOLN IV. No oral contrast. COMPARISON:  PET/CT 09/15/2017 FINDINGS: Cardiovascular: Atherosclerotic calcifications aorta, coronary arteries and proximal great vessels. Aorta normal caliber. Pulmonary arteries adequately opacified without gross filling defect by non dedicated exam. Minimal pericardial effusion. Mediastinum/Nodes: No esophageal abnormalities. Base of cervical region normal appearance. No thoracic adenopathy. Lungs/Pleura: Severe emphysematous changes. Central peribronchial  thickening. Infiltrate identified in RIGHT lower lobe and posterior aspect of RIGHT upper lobe new since previous exam associated with thickening of the major and minor fissures posteriorly in mid RIGHT chest. These findings could represent pneumonia but cannot exclude underlying tumor within these areas of consolidation in the posterior RIGHT lung. Subsegmental atelectasis lingula. Few calcified granulomata within RIGHT lower lobe. Small RIGHT pleural effusion. No pneumothorax. Minimal dependent atelectasis or infiltrate in LEFT lower lobe, potentially obscuring the previously seen LEFT lower lobe pulmonary nodule versus interval resolution of nodule. No additional mass/nodule. Paramediastinal fibrosis medial LEFT  upper lobe. Upper Abdomen: Splenic enlargement. Triangular defect and splenic enhancement at medial spleen, could be an artifact related to early phase of imaging but a splenic infarct is not excluded, area in question 3.3 x 2.0 cm. Cortical scarring and cysts within the upper kidneys bilaterally. Remaining visualized upper abdomen unremarkable. Musculoskeletal: Demineralized without focal lesion. IMPRESSION: COPD changes with new infiltrates identified in the posterior mid light lung involving the RIGHT lower lobe and posterior RIGHT upper lobe, associated with pleural thickening at the posterior aspects of the RIGHT major and minor fissures; this could represent infection though underlying tumor is not excluded with this appearance. Chronic subsegmental atelectasis versus scarring in lingula. Previously identified LEFT lower lobe pulmonary nodule is in not visualized on current study, question resolved versus obscured by mild atelectasis or infiltrate dependently in LEFT lower lobe. Minimal pericardial effusion. Splenomegaly with question new small splenic infarct versus flow artifact at medial spleen. Aortic Atherosclerosis (ICD10-I70.0) and Emphysema (ICD10-J43.9). These results will be called to the  ordering clinician or representative by the Radiologist Assistant, and communication documented in the PACS or zVision Dashboard. Electronically Signed   By: Lavonia Dana M.D.   On: 01/05/2018 12:52   Ct Head Code Stroke Wo Contrast  Result Date: 01/05/2018 CLINICAL DATA:  Code stroke. 77 y/o F; episode of slurred speech and slow to respond starting 30 minutes ago. EXAM: CT HEAD WITHOUT CONTRAST TECHNIQUE: Contiguous axial images were obtained from the base of the skull through the vertex without intravenous contrast. COMPARISON:  None. FINDINGS: Brain: No evidence of acute infarction, hemorrhage, hydrocephalus, extra-axial collection or mass lesion/mass effect. Few foci of white matter hypoattenuation are nonspecific but consistent with mild chronic microvascular ischemic changes. There is mild volume loss of the brain. Dystrophic calcifications within the basal ganglia noted. Vascular: Calcific atherosclerosis of the carotid siphons. No hyperdense vessel identified. Skull: Normal. Negative for fracture or focal lesion. Sinuses/Orbits: No acute finding. Other: None. ASPECTS [facility] Stroke Program Early CT Score) - Ganglionic level infarction (caudate, lentiform nuclei, internal capsule, insula, M1-M3 cortex): 7 - Supraganglionic infarction (M4-M6 cortex): 3 Total score (0-10 with 10 being normal): 10 IMPRESSION: 1. No acute intracranial abnormality identified. 2. ASPECTS is 10 3. Mild chronic microvascular ischemic changes and volume loss of the brain. These results were called by telephone at the time of interpretation on 01/05/2018 at 4:21 pm to Dr. Vaughan Basta , who verbally acknowledged these results. Electronically Signed   By: Kristine Garbe M.D.   On: 01/05/2018 16:24    ASSESSMENT: Hypercalcemia, confusion, history of lung cancer and Hodgkin's lymphoma.  PLAN:    1.  Left lower lobe nodule: Not seen on most recent CT scan.   2.  Left upper lobe lung cancer: Previously biopsied  and confirmed squamous cell carcinoma.  Patient was subsequently treated with XRT only.    No evidence of disease in left upper lobe.  CT scan reports paramediastinal fibrosis. 3. Right lower lobe nodule: Also biopsied and confirmed to be squamous cell carcinoma.  Patient completed treatment with XRT in November 2018.  Patient noted to have central peribronchial thickening and infiltrate in right lower lobe associated with thickening of the major and minor fissures posteriorly this is unlikely malignancy but cannot definitively excluded.  Patient will require a PET scan after discharge to further evaluate. 4.  Hypercalcemia: Unclear etiology at this point, but improved.  Patient does not require Zometa.  Unlikely questionable findings in the right lower lobe would cause such an elevation  in her calcium levels.  Can consider bone scan to evaluate for bony disease if no improvement.  Otherwise, will repeat scan PET scan as above as an outpatient.  Patient also had XRT to her neck for history of Hodgkin's lymphoma, therefore can consider parathyroid disorder.  Consider endocrinology consult if no etiology can be determined. 5.  Leukopenia: Patient's white blood cell count appears to be about her baseline.  Monitor. 6.  Thrombocytopenia: Patient had normal platelet count 3 months ago.  Unclear etiology at this time. 7.  Anemia: Patient also had a normal hemoglobin 3 months ago, therefore will order laboratory work including iron stores for further evaluation. 8.  Disposition: If patient is discharged over the weekend, all of her work-up can can be completed as an outpatient.  Please call the on-call physician, Dr. Rogue Bussing, if there are any questions or concerns. Patient expressed understanding and was in agreement with this plan. She also understands that She can call clinic at any time with any questions, concerns, or complaints.   Cancer Staging Hodgkin's lymphoma [facility]) Staging form: Lymphoid Neoplasms,  AJCC 6th Edition - Clinical stage from 01/24/2016: Stage II - Signed by Lloyd Huger, MD on 01/24/2016  Primary cancer of left upper lobe of lung [facility]) Staging form: Lung, AJCC 7th Edition - Clinical stage from 01/24/2016: Stage IA (T1a, N0, M0) - Signed by Lloyd Huger, MD on 01/24/2016   Lloyd Huger, MD   01/05/2018 5:24 PM

## 2018-01-05 NOTE — Care Management Important Message (Signed)
Copy of signed IM left with patient in room.  

## 2018-01-05 NOTE — Progress Notes (Signed)
Aproximately 1525 pm called to room by assistant. Pt exhibiting marked change in neurological status. New onset of slurred speech with increase confusion. MD notified Code Stroke implemented. Aproximately. 1850 notified of elevated BP. Contacted on call MD. NO new orders. On coming nurse aware.  ECG performed approximately 1855. Abnormal results. Oncoming nurse notified and given copy of results.

## 2018-01-05 NOTE — Significant Event (Addendum)
Rapid Response Event Note  Overview: Time Called: [place] Time: 2336 Event Type: Neurologic  Initial Focused Assessment: Per Patient's RN Brandi- pt was alert and oriented all day but became confused and had slurred speech.  Family at bedside and stated that this confusion and slurred speech was a change.  Interventions: I assessed the patient- Pt was alert and oriented.  Her speech was clear. No facial weakness or numbness- no blurred vision. Sensory on face and extremities was normal on both sides.  Strength in upper and lower extremity equal on both sides though hands were weak.  Plan of Care (if not transferred): Patient went down for CT of head since per family and RN there is a change in patient's speech and confusion. Called and Notified Dr. Molli Hazard of my assessment.  He stated that the patient does not need to come to the ICU at this time.   Event Summary:   at      at          [facility]

## 2018-01-05 NOTE — Progress Notes (Signed)
I was called by nurse for speech change  Seen the pt. She have no new complains. Family and nurse noticed speech change around 3;25 pm.  Pt is able to walk upto bathroom, when I entered the room. She is alert and oriented. No facial weakness. Eye movements normal. Power 4-5 /5 all 4 limbs. Sensation intact.  Vitals stable, checked.  Assessment and plan.  * slurred speech  Activate code stroke. Stat CT head. Discussed with Dr. Jules Husbands, she is not in house, nurse will follow code stroke protocol.  Currrently pt have minor symptoms, so she may not qualify for any interventions.  Additional time spent 30 min.

## 2018-01-05 NOTE — Progress Notes (Signed)
ECG results texted to on call doctor.  Abnormal ECG, cannot rule out anterior infarct, age undetermined, nsr. Dorna Bloom RN

## 2018-01-05 NOTE — Progress Notes (Signed)
[facility] [place] NAME: Marie Lowe    MR#:  [id number]  DATE OF BIRTH:  1940/11/26  SUBJECTIVE:  CHIEF COMPLAINT:   Chief Complaint  Patient presents with  . Dizziness   Came with dizziness, noted to have Hypercalcemia, feels little better now.  REVIEW OF SYSTEMS:  CONSTITUTIONAL: No fever, fatigue or weakness.  EYES: No blurred or double vision.  EARS, NOSE, AND THROAT: No tinnitus or ear pain.  RESPIRATORY: No cough, shortness of breath, wheezing or hemoptysis.  CARDIOVASCULAR: No chest pain, orthopnea, edema.  GASTROINTESTINAL: No nausea, vomiting, diarrhea or abdominal pain.  GENITOURINARY: No dysuria, hematuria.  ENDOCRINE: No polyuria, nocturia,  HEMATOLOGY: No anemia, easy bruising or bleeding SKIN: No rash or lesion. MUSCULOSKELETAL: No joint pain or arthritis.   NEUROLOGIC: No tingling, numbness, weakness.  PSYCHIATRY: No anxiety or depression.   ROS  DRUG ALLERGIES:   Allergies  Allergen Reactions  . Hydrochlorothiazide Other (See Comments)    Elevation of creatinine Patient doesn't remember what reaction she had to it.    VITALS:  Blood pressure (!) 164/71, pulse 89, temperature 98.2 F (36.8 C), temperature source Oral, resp. rate 18, height 5\' 4"  (1.626 m), weight 59.4 kg, SpO2 97 %.  PHYSICAL EXAMINATION:  GENERAL:  77 y.o.-year-old patient lying in the bed with no acute distress.  EYES: Pupils equal, round, reactive to light and accommodation. No scleral icterus. Extraocular muscles intact.  HEENT: Head atraumatic, normocephalic. Oropharynx and nasopharynx clear.  NECK:  Supple, no jugular venous distention. No thyroid enlargement, no tenderness.  LUNGS: Normal breath sounds bilaterally, no wheezing, rales,rhonchi or crepitation. No use of accessory muscles of respiration.  CARDIOVASCULAR: S1, S2 normal. No murmurs, rubs, or gallops.  ABDOMEN: Soft, nontender, nondistended. Bowel sounds present. No  organomegaly or mass.  EXTREMITIES: No pedal edema, cyanosis, or clubbing.  NEUROLOGIC: Cranial nerves II through XII are intact. Muscle strength 4-5/5 in all extremities. Sensation intact. Gait not checked.  PSYCHIATRIC: The patient is alert and oriented x 3.  SKIN: No obvious rash, lesion, or ulcer.   Physical Exam LABORATORY PANEL:   CBC Recent Labs  Lab 01/04/18 0409  WBC 2.4*  HGB 10.0*  HCT 29.5*  PLT 63*   ------------------------------------------------------------------------------------------------------------------  Chemistries  Recent Labs  Lab 01/03/18 1424  01/05/18 0439  NA  --    < > 141  K  --    < > 3.8  CL  --    < > 109  CO2  --    < > 26  GLUCOSE  --    < > 104*  BUN  --    < > 23  CREATININE  --    < > 1.24*  CALCIUM 12.7*   < > 11.4*  MG 1.6*  --   --   AST 41  --   --   ALT 26  --   --   ALKPHOS 216*  --   --   BILITOT 1.4*  --   --    < > = values in this interval not displayed.   ------------------------------------------------------------------------------------------------------------------  Cardiac Enzymes Recent Labs  Lab 01/03/18 1424  TROPONINI 0.03*   ------------------------------------------------------------------------------------------------------------------  RADIOLOGY:  Dg Chest 2 View  Result Date: 01/03/2018 CLINICAL DATA:  77 year old female with intermittent dizziness and lightheadedness since Thursday. 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 EXAM: CHEST - 2 VIEW COMPARISON:  Most recent prior imaging is a PET-CT from 09/15/2017 FINDINGS: Mild cardiomegaly. Atherosclerotic calcifications are noted in the transverse aorta. The mediastinal contours are within normal limits. Biapical pleuroparenchymal scarring. Overall hyperinflation with hyperlucency in the upper lungs consistent with underlying emphysema. Areas of linear airspace opacity are present  in the lingula and right lower lobe. Comparing across modalities to the prior PET-CT demonstrates a similar distribution of pleuroparenchymal scarring. No definite new airspace consolidation, pleural effusion or pneumothorax. No acute osseous abnormality. IMPRESSION: Comparing across modalities to the prior PET-CT dated 09/15/2017, similar appearance of the lungs with a combination of patchy and linear airspace opacities in the lingula, and in the upper and lower aspects of the right lower lobe. Findings may reflect chronic pleuroparenchymal scarring and/or chronic/indolent infection. Stable cardiomegaly. Aortic Atherosclerosis (ICD10-I70.0) and Emphysema (ICD10-J43.9). Electronically Signed   By: Jacqulynn Cadet M.D.   On: 01/03/2018 14:17    ASSESSMENT AND PLAN:   Active Problems:   Hypercalcemia  Marie Lowe  is a 77 y.o. 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  1.  Hypercalcemia-history of borderline hypercalcemia with calcium levels around 10-11 in the past.  on admission 13.2- down to 11.3 now. -History of cancer.  Will get a CT chest -IV fluids, oncology consult- May need Zometa -PTH is low, PTH related peptide is in process and  phosphorus levels are normal.  2.  Acute encephalopathy-likely secondary to her hypercalcemia.  UA negative.  3.  Pancytopenia-previous history of leukopenia and borderline anemia.  Platelets also noted to be low this time.  Continue to monitor closely.  4.  History of lung cancer-patient has history of left lower lobe nodule that is being monitored, previous biopsy confirmed squamous cell carcinoma and was treated with radiation only since it was  stage I.  Also has right lower lobe nodule that was confirmed to be squamous cell carcinoma and finished radiation treatment for that as well.  Recent PET scan and April 2019 did not show any concerning findings. -Follow-up CT of the chest this admission due to hypercalcemia  5.  Hypertension-on carvedilol and benazepril.  6.  History of bronchitis/sinusitis-currently on amoxicillin, continue for 3 more days and stop  7.  DVT prophylaxis-on Lovenox   All the records are reviewed and case discussed with Care Management/Social Workerr. Management plans discussed with the patient, family and they are in agreement.  CODE STATUS: DNR  TOTAL TIME TAKING CARE OF THIS PATIENT: 35 minutes.     POSSIBLE D/C IN 1-2 DAYS, DEPENDING ON CLINICAL CONDITION.   Vaughan Basta M.D on 01/05/2018   Between 7am to 6pm - Pager - [phone]  After 6pm go to [email or web address] - password [email or web address] [facility]  Office  [phone]  CC: Primary care physician; Marinda Elk, MD  Note: This dictation was prepared with Dragon dictation along with smaller phrase technology. Any transcriptional errors that result from this process are unintentional.

## 2018-01-05 NOTE — Progress Notes (Signed)
[facility] [place] NAME: Marie Lowe    MR#:  [id number]  DATE OF BIRTH:  21-Feb-1941  SUBJECTIVE:  CHIEF COMPLAINT:   Chief Complaint  Patient presents with  . Dizziness   Came with dizziness, noted to have Hypercalcemia, feels little better now.  still have dizziness on trial to stand up.  REVIEW OF SYSTEMS:  CONSTITUTIONAL: No fever, fatigue or weakness.  EYES: No blurred or double vision.  EARS, NOSE, AND THROAT: No tinnitus or ear pain.  RESPIRATORY: No cough, shortness of breath, wheezing or hemoptysis.  CARDIOVASCULAR: No chest pain, orthopnea, edema.  GASTROINTESTINAL: No nausea, vomiting, diarrhea or abdominal pain.  GENITOURINARY: No dysuria, hematuria.  ENDOCRINE: No polyuria, nocturia,  HEMATOLOGY: No anemia, easy bruising or bleeding SKIN: No rash or lesion. MUSCULOSKELETAL: No joint pain or arthritis.   NEUROLOGIC: No tingling, numbness, weakness.  PSYCHIATRY: No anxiety or depression.   ROS  DRUG ALLERGIES:   Allergies  Allergen Reactions  . Hydrochlorothiazide Other (See Comments)    Elevation of creatinine Patient doesn't remember what reaction she had to it.    VITALS:  Blood pressure (!) 164/71, pulse 89, temperature 98.2 F (36.8 C), temperature source Oral, resp. rate 18, height 5\' 4"  (1.626 m), weight 59.4 kg, SpO2 97 %.  PHYSICAL EXAMINATION:  GENERAL:  77 y.o.-year-old patient lying in the bed with no acute distress.  EYES: Pupils equal, round, reactive to light and accommodation. No scleral icterus. Extraocular muscles intact.  HEENT: Head atraumatic, normocephalic. Oropharynx and nasopharynx clear.  NECK:  Supple, no jugular venous distention. No thyroid enlargement, no tenderness.  LUNGS: Normal breath sounds bilaterally, no wheezing, rales,rhonchi or crepitation. No use of accessory muscles of respiration.  CARDIOVASCULAR: S1, S2 normal. No murmurs, rubs, or gallops.  ABDOMEN: Soft,  nontender, nondistended. Bowel sounds present. No organomegaly or mass.  EXTREMITIES: No pedal edema, cyanosis, or clubbing.  NEUROLOGIC: Cranial nerves II through XII are intact. Muscle strength 4-5/5 in all extremities. Sensation intact. Gait not checked.  PSYCHIATRIC: The patient is alert and oriented x 3.  SKIN: No obvious rash, lesion, or ulcer.   Physical Exam LABORATORY PANEL:   CBC Recent Labs  Lab 01/04/18 0409  WBC 2.4*  HGB 10.0*  HCT 29.5*  PLT 63*   ------------------------------------------------------------------------------------------------------------------  Chemistries  Recent Labs  Lab 01/03/18 1424  01/05/18 0439  NA  --    < > 141  K  --    < > 3.8  CL  --    < > 109  CO2  --    < > 26  GLUCOSE  --    < > 104*  BUN  --    < > 23  CREATININE  --    < > 1.24*  CALCIUM 12.7*   < > 11.4*  MG 1.6*  --   --   AST 41  --   --   ALT 26  --   --   ALKPHOS 216*  --   --   BILITOT 1.4*  --   --    < > = values in this interval not displayed.   ------------------------------------------------------------------------------------------------------------------  Cardiac Enzymes Recent Labs  Lab 01/03/18 1424  TROPONINI 0.03*   ------------------------------------------------------------------------------------------------------------------  RADIOLOGY:  Dg Chest 2 View  Result Date: 01/03/2018 CLINICAL DATA:  77 year old female with intermittent dizziness and lightheadedness since Thursday. 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 EXAM: CHEST - 2 VIEW COMPARISON:  Most recent prior imaging is a PET-CT from 09/15/2017 FINDINGS: Mild cardiomegaly. Atherosclerotic calcifications are noted in the transverse aorta. The mediastinal contours are within normal limits. Biapical pleuroparenchymal scarring. Overall hyperinflation with hyperlucency in the upper lungs consistent with underlying  emphysema. Areas of linear airspace opacity are present in the lingula and right lower lobe. Comparing across modalities to the prior PET-CT demonstrates a similar distribution of pleuroparenchymal scarring. No definite new airspace consolidation, pleural effusion or pneumothorax. No acute osseous abnormality. IMPRESSION: Comparing across modalities to the prior PET-CT dated 09/15/2017, similar appearance of the lungs with a combination of patchy and linear airspace opacities in the lingula, and in the upper and lower aspects of the right lower lobe. Findings may reflect chronic pleuroparenchymal scarring and/or chronic/indolent infection. Stable cardiomegaly. Aortic Atherosclerosis (ICD10-I70.0) and Emphysema (ICD10-J43.9). Electronically Signed   By: Jacqulynn Cadet M.D.   On: 01/03/2018 14:17   Ct Chest W Contrast  Result Date: 01/05/2018 CLINICAL DATA:  Hypercalcemia, history of lung cancer, Hodgkin's lymphoma, COPD, hypertension, former smoker EXAM: CT CHEST WITH CONTRAST TECHNIQUE: Multidetector CT imaging of the chest was performed during intravenous contrast administration. Sagittal and coronal MPR images reconstructed from axial data set. CONTRAST:  81mL OMNIPAQUE IOHEXOL 300 MG/ML SOLN IV. No oral contrast. COMPARISON:  PET/CT 09/15/2017 FINDINGS: Cardiovascular: Atherosclerotic calcifications aorta, coronary arteries and proximal great vessels. Aorta normal caliber. Pulmonary arteries adequately opacified without gross filling defect by non dedicated exam. Minimal pericardial effusion. Mediastinum/Nodes: No esophageal abnormalities. Base of cervical region normal appearance. No thoracic adenopathy. Lungs/Pleura: Severe emphysematous changes. Central peribronchial thickening. Infiltrate identified in RIGHT lower lobe and posterior aspect of RIGHT upper lobe new since previous exam associated with thickening of the major and minor fissures posteriorly in mid RIGHT chest. These findings could represent  pneumonia but cannot exclude underlying tumor within these areas of consolidation in the posterior RIGHT lung. Subsegmental atelectasis lingula. Few calcified granulomata within RIGHT lower lobe. Small RIGHT pleural effusion. No pneumothorax. Minimal dependent atelectasis or infiltrate in LEFT lower lobe, potentially obscuring the previously seen LEFT lower lobe pulmonary nodule versus interval resolution of nodule. No additional mass/nodule. Paramediastinal fibrosis medial LEFT upper lobe. Upper Abdomen: Splenic enlargement. Triangular defect and splenic enhancement at medial spleen, could be an artifact related to early phase of imaging but a splenic infarct is not excluded, area in question 3.3 x 2.0 cm. Cortical scarring and cysts within the upper kidneys bilaterally. Remaining visualized upper abdomen unremarkable. Musculoskeletal: Demineralized without focal lesion. IMPRESSION: COPD changes with new infiltrates identified in the posterior mid light lung involving the RIGHT lower lobe and posterior RIGHT upper lobe, associated with pleural thickening at the posterior aspects of the RIGHT major and minor fissures; this could represent infection though underlying tumor is not excluded with this appearance. Chronic subsegmental atelectasis versus scarring in lingula. Previously identified LEFT lower lobe pulmonary nodule is in not visualized on current study, question resolved versus obscured by mild atelectasis or infiltrate dependently in LEFT lower lobe. Minimal pericardial effusion. Splenomegaly with question new small splenic infarct versus flow artifact at medial spleen. Aortic Atherosclerosis (ICD10-I70.0) and Emphysema (ICD10-J43.9). These results will be called to the ordering clinician or representative by the Radiologist Assistant, and communication documented in the PACS or zVision Dashboard. Electronically Signed   By: Lavonia Dana M.D.   On: 01/05/2018 12:52    ASSESSMENT AND PLAN:   Active  Problems:   Hypercalcemia  Marie Lowe  is a 77 y.o. 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  *  Hypercalcemia-history of borderline hypercalcemia with calcium levels around 10-11 in the past.  on admission 13.2- down to 11.3 now. -History of cancer.  Will get a CT chest -IV fluids, oncology consult- May need Zometa -PTH is low, PTH related peptide is in process and  phosphorus levels are normal.  *  Acute encephalopathy-likely secondary to her hypercalcemia.  UA negative.  *  Pancytopenia-previous history of leukopenia and borderline anemia.  Platelets also noted to be low this time.  Continue to monitor closely.  *  History of lung cancer-patient has history of left lower lobe nodule that is being monitored, previous biopsy confirmed squamous cell carcinoma and was treated with radiation only since it was stage I.  Also has right lower lobe nodule that was confirmed to be squamous cell carcinoma and finished radiation treatment for that as well.  Recent PET scan and April 2019 did not show any concerning findings. -Follow-up CT of the chest this admission due to hypercalcemia  * community acquired pneumonia- noted on CT chest.   Eugenie Birks top amoxicilline and start rocephin + azithromycin  *  Hypertension-on carvedilol and benazepril.  *  History of bronchitis/sinusitis-currently on amoxicillin- started before she came, continue for 3 more days and stop  *  DVT prophylaxis-on Lovenox  * Dizziness- check orthostatic vital and PT eval.  All the records are reviewed and case discussed with Care Management/Social Workerr. Management plans discussed with the patient, family and  they are in agreement.  CODE STATUS: DNR  TOTAL TIME TAKING CARE OF THIS PATIENT: 35 minutes.     POSSIBLE D/C IN 1-2 DAYS, DEPENDING ON CLINICAL CONDITION.   Vaughan Basta M.D on 01/05/2018   Between 7am to 6pm - Pager - [phone]  After 6pm go to [email or web address] - password [email or web address] [facility]  Office  [phone]  CC: Primary care physician; Marinda Elk, MD  Note: This dictation was prepared with Dragon dictation along with smaller phrase technology. Any transcriptional errors that result from this process are unintentional.

## 2018-01-05 NOTE — Progress Notes (Signed)
   01/05/18 1555  Clinical Encounter Type  Visited With Patient and family together  Visit Type Follow-up;Spiritual support;Code  Referral From Nurse  Consult/Referral To Chaplain  Spiritual Encounters  Spiritual Needs Prayer   Marie Lowe received a Code Stroke for Marie Lowe. I provided a pastoral presences as the care team treated the patient. I prayed outside of the room for the family and staff. I will follow up as needed.

## 2018-01-06 ENCOUNTER — Inpatient Hospital Stay: Payer: Medicare Other

## 2018-01-06 DIAGNOSIS — G459 Transient cerebral ischemic attack, unspecified: Secondary | ICD-10-CM

## 2018-01-06 LAB — BASIC METABOLIC PANEL
Anion gap: 5 (ref 5–15)
BUN: 16 mg/dL (ref 8–23)
CO2: 26 mmol/L (ref 22–32)
CREATININE: 1.19 mg/dL — AB (ref 0.44–1.00)
Calcium: 10.8 mg/dL — ABNORMAL HIGH (ref 8.9–10.3)
Chloride: 108 mmol/L (ref 98–111)
GFR calc Af Amer: 50 mL/min — ABNORMAL LOW (ref >60)
GFR, EST NON AFRICAN AMERICAN: 43 mL/min — AB (ref >60)
Glucose, Bld: 82 mg/dL (ref 70–99)
POTASSIUM: 3.5 mmol/L (ref 3.5–5.1)
SODIUM: 139 mmol/L (ref 135–145)

## 2018-01-06 LAB — FERRITIN: Ferritin: 293 ng/mL (ref 11–307)

## 2018-01-06 LAB — APTT: aPTT: 57 seconds — ABNORMAL HIGH (ref 24–36)

## 2018-01-06 LAB — CBC WITH DIFFERENTIAL/PLATELET
BASOS PCT: 1 %
Basophils Absolute: 0 10*3/uL (ref 0–0.1)
EOS ABS: 0.3 10*3/uL (ref 0–0.7)
EOS PCT: 13 %
HCT: 33.4 % — ABNORMAL LOW (ref 35.0–47.0)
Hemoglobin: 11.1 g/dL — ABNORMAL LOW (ref 12.0–16.0)
LYMPHS ABS: 0.7 10*3/uL — AB (ref 1.0–3.6)
Lymphocytes Relative: 29 %
MCH: 30.5 pg (ref 26.0–34.0)
MCHC: 33.3 g/dL (ref 32.0–36.0)
MCV: 91.7 fL (ref 80.0–100.0)
MONO ABS: 0.8 10*3/uL (ref 0.2–0.9)
Monocytes Relative: 33 %
NEUTROS ABS: 0.6 10*3/uL — AB (ref 1.4–6.5)
Neutrophils Relative %: 24 %
PLATELETS: 66 10*3/uL — AB (ref 150–440)
RBC: 3.64 MIL/uL — ABNORMAL LOW (ref 3.80–5.20)
RDW: 18.6 % — ABNORMAL HIGH (ref 11.5–14.5)
WBC: 2.4 10*3/uL — ABNORMAL LOW (ref 3.6–11.0)

## 2018-01-06 LAB — LIPID PANEL
CHOL/HDL RATIO: 4.7 ratio
CHOLESTEROL: 132 mg/dL (ref 0–200)
HDL: 28 mg/dL — ABNORMAL LOW (ref >40)
LDL Cholesterol: 83 mg/dL (ref 0–99)
TRIGLYCERIDES: 105 mg/dL (ref <150)
VLDL: 21 mg/dL (ref 0–40)

## 2018-01-06 LAB — VITAMIN B12: Vitamin B-12: 3192 pg/mL — ABNORMAL HIGH (ref 180–914)

## 2018-01-06 LAB — IRON AND TIBC
IRON: 44 ug/dL (ref 28–170)
SATURATION RATIOS: 27 % (ref 10.4–31.8)
TIBC: 162 ug/dL — ABNORMAL LOW (ref 250–450)
UIBC: 118 ug/dL

## 2018-01-06 LAB — PROTIME-INR
INR: 1.22
Prothrombin Time: 15.3 seconds — ABNORMAL HIGH (ref 11.4–15.2)

## 2018-01-06 LAB — FOLATE: Folate: 20 ng/mL (ref >5.9)

## 2018-01-06 LAB — LACTATE DEHYDROGENASE: LDH: 133 U/L (ref 98–192)

## 2018-01-06 MED ORDER — ASPIRIN 81 MG PO CHEW
81.0000 mg | CHEWABLE_TABLET | Freq: Every day | ORAL | Status: DC
  Administered 2018-01-06 – 2018-01-07 (×2): 81 mg via ORAL
  Filled 2018-01-06 (×2): qty 1

## 2018-01-06 MED ORDER — ENOXAPARIN SODIUM 40 MG/0.4ML ~~LOC~~ SOLN
40.0000 mg | SUBCUTANEOUS | Status: DC
  Administered 2018-01-06: 21:00:00 40 mg via SUBCUTANEOUS
  Filled 2018-01-06: qty 0.4

## 2018-01-06 MED ORDER — ZOLEDRONIC ACID 4 MG/5ML IV CONC
3.0000 mg | Freq: Once | INTRAVENOUS | Status: AC
  Administered 2018-01-06: 3 mg via INTRAVENOUS
  Filled 2018-01-06: qty 3.75

## 2018-01-06 MED ORDER — DOCUSATE SODIUM 100 MG PO CAPS
100.0000 mg | ORAL_CAPSULE | Freq: Two times a day (BID) | ORAL | Status: DC
  Administered 2018-01-06 – 2018-01-07 (×2): 100 mg via ORAL
  Filled 2018-01-06 (×3): qty 1

## 2018-01-06 MED ORDER — ATORVASTATIN CALCIUM 20 MG PO TABS
40.0000 mg | ORAL_TABLET | Freq: Every day | ORAL | Status: DC
  Administered 2018-01-06 – 2018-01-07 (×2): 40 mg via ORAL
  Filled 2018-01-06 (×2): qty 2

## 2018-01-06 MED ORDER — BISACODYL 5 MG PO TBEC: 5.0000 mg | DELAYED_RELEASE_TABLET | Freq: Every day | ORAL | Status: DC | PRN

## 2018-01-06 NOTE — Progress Notes (Addendum)
[facility] [place] NAME: Marie Lowe    MR#:  [id number]  DATE OF BIRTH:  November 19, 1940  SUBJECTIVE:  CHIEF COMPLAINT:   Chief Complaint  Patient presents with  . Dizziness    Slurred speech is much improved.  Patient has no complaints except generalized weakness. REVIEW OF SYSTEMS:  CONSTITUTIONAL: No fever, has generalized weakness.  EYES: No blurred or double vision.  EARS, NOSE, AND THROAT: No tinnitus or ear pain.  RESPIRATORY: No cough, shortness of breath, wheezing or hemoptysis.  CARDIOVASCULAR: No chest pain, orthopnea, edema.  GASTROINTESTINAL: No nausea, vomiting, diarrhea or abdominal pain.  GENITOURINARY: No dysuria, hematuria.  ENDOCRINE: No polyuria, nocturia,  HEMATOLOGY: No anemia, easy bruising or bleeding SKIN: No rash or lesion. MUSCULOSKELETAL: No joint pain or arthritis.   NEUROLOGIC: No tingling, numbness, weakness.  PSYCHIATRY: No anxiety or depression.   ROS  DRUG ALLERGIES:   Allergies  Allergen Reactions  . Hydrochlorothiazide Other (See Comments)    Elevation of creatinine Patient doesn't remember what reaction she had to it.    VITALS:  Blood pressure 139/80, pulse 85, temperature 98.5 F (36.9 C), temperature source Oral, resp. rate 20, height 5\' 4"  (1.626 m), weight 59.4 kg, SpO2 100 %.  PHYSICAL EXAMINATION:  GENERAL:  77 y.o.-year-old patient lying in the bed with no acute distress.  EYES: Pupils equal, round, reactive to light and accommodation. No scleral icterus. Extraocular muscles intact.  HEENT: Head atraumatic, normocephalic. Oropharynx and nasopharynx clear.  NECK:  Supple, no jugular venous distention. No thyroid enlargement, no tenderness.  LUNGS: Normal breath sounds bilaterally, no wheezing, rales,rhonchi or crepitation. No use of accessory muscles of respiration.  CARDIOVASCULAR: S1, S2 normal. No murmurs, rubs, or gallops.  ABDOMEN: Soft, nontender, nondistended. Bowel sounds  present. No organomegaly or mass.  EXTREMITIES: No pedal edema, cyanosis, or clubbing.  NEUROLOGIC: Cranial nerves II through XII are intact. Muscle strength 4-5/5 in all extremities. Sensation intact. Gait not checked.  PSYCHIATRIC: The patient is alert and oriented x 3.  SKIN: No obvious rash, lesion, or ulcer.   Physical Exam LABORATORY PANEL:   CBC Recent Labs  Lab 01/06/18 1032  WBC 2.4*  HGB 11.1*  HCT 33.4*  PLT 66*   ------------------------------------------------------------------------------------------------------------------  Chemistries  Recent Labs  Lab 01/03/18 1424  01/06/18 1032  NA  --    < > 139  K  --    < > 3.5  CL  --    < > 108  CO2  --    < > 26  GLUCOSE  --    < > 82  BUN  --    < > 16  CREATININE  --    < > 1.19*  CALCIUM 12.7*   < > 10.8*  MG 1.6*  --   --   AST 41  --   --   ALT 26  --   --   ALKPHOS 216*  --   --   BILITOT 1.4*  --   --    < > = values in this interval not displayed.   ------------------------------------------------------------------------------------------------------------------  Cardiac Enzymes Recent Labs  Lab 01/03/18 1424  TROPONINI 0.03*   ------------------------------------------------------------------------------------------------------------------  RADIOLOGY:  Ct Chest W Contrast  Result Date: 01/05/2018 CLINICAL DATA:  Hypercalcemia, history of lung cancer, Hodgkin's lymphoma, COPD, hypertension, former smoker EXAM: CT CHEST WITH CONTRAST TECHNIQUE: Multidetector CT imaging of the chest was performed during intravenous contrast administration. Sagittal and coronal  MPR images reconstructed from axial data set. CONTRAST:  38mL OMNIPAQUE IOHEXOL 300 MG/ML SOLN IV. No oral contrast. COMPARISON:  PET/CT 09/15/2017 FINDINGS: Cardiovascular: Atherosclerotic calcifications aorta, coronary arteries and proximal great vessels. Aorta normal caliber. Pulmonary arteries adequately opacified without gross filling  defect by non dedicated exam. Minimal pericardial effusion. Mediastinum/Nodes: No esophageal abnormalities. Base of cervical region normal appearance. No thoracic adenopathy. Lungs/Pleura: Severe emphysematous changes. Central peribronchial thickening. Infiltrate identified in RIGHT lower lobe and posterior aspect of RIGHT upper lobe new since previous exam associated with thickening of the major and minor fissures posteriorly in mid RIGHT chest. These findings could represent pneumonia but cannot exclude underlying tumor within these areas of consolidation in the posterior RIGHT lung. Subsegmental atelectasis lingula. Few calcified granulomata within RIGHT lower lobe. Small RIGHT pleural effusion. No pneumothorax. Minimal dependent atelectasis or infiltrate in LEFT lower lobe, potentially obscuring the previously seen LEFT lower lobe pulmonary nodule versus interval resolution of nodule. No additional mass/nodule. Paramediastinal fibrosis medial LEFT upper lobe. Upper Abdomen: Splenic enlargement. Triangular defect and splenic enhancement at medial spleen, could be an artifact related to early phase of imaging but a splenic infarct is not excluded, area in question 3.3 x 2.0 cm. Cortical scarring and cysts within the upper kidneys bilaterally. Remaining visualized upper abdomen unremarkable. Musculoskeletal: Demineralized without focal lesion. IMPRESSION: COPD changes with new infiltrates identified in the posterior mid light lung involving the RIGHT lower lobe and posterior RIGHT upper lobe, associated with pleural thickening at the posterior aspects of the RIGHT major and minor fissures; this could represent infection though underlying tumor is not excluded with this appearance. Chronic subsegmental atelectasis versus scarring in lingula. Previously identified LEFT lower lobe pulmonary nodule is in not visualized on current study, question resolved versus obscured by mild atelectasis or infiltrate dependently in  LEFT lower lobe. Minimal pericardial effusion. Splenomegaly with question new small splenic infarct versus flow artifact at medial spleen. Aortic Atherosclerosis (ICD10-I70.0) and Emphysema (ICD10-J43.9). These results will be called to the ordering clinician or representative by the Radiologist Assistant, and communication documented in the PACS or zVision Dashboard. Electronically Signed   By: Lavonia Dana M.D.   On: 01/05/2018 12:52   Mr Brain Wo Contrast  Result Date: 01/06/2018 CLINICAL DATA:  Unexplained dysphagia. Three 4 days of intermittent lightheadedness and dizziness EXAM: MRI HEAD WITHOUT CONTRAST TECHNIQUE: Multiplanar, multiecho pulse sequences of the brain and surrounding structures were obtained without intravenous contrast. COMPARISON:  Head CT from yesterday. FINDINGS: Brain: No acute infarction, hemorrhage, hydrocephalus, extra-axial collection or mass lesion. Age normal brain volume. Minimal for age signal abnormality in the cerebral white matter and pons, presumed remote microvascular insults. No chronic blood products. No finding in the brainstem, cisterns, or skull base to explain history of dysphagia. Vascular: Major flow voids are preserved Skull and upper cervical spine: No evident marrow lesion. The visible cervical disc spaces are narrowed with endplate spurring. Sinuses/Orbits: Mild mucosal thickening in the paranasal sinuses, mainly maxillary sinuses. Negative orbits. IMPRESSION: Unremarkable MRI of the brain for age.  No explanation for symptoms. Electronically Signed   By: Monte Fantasia M.D.   On: 01/06/2018 10:24   Ct Head Code Stroke Wo Contrast  Result Date: 01/05/2018 CLINICAL DATA:  Code stroke. 77 y/o F; episode of slurred speech and slow to respond starting 30 minutes ago. EXAM: CT HEAD WITHOUT CONTRAST TECHNIQUE: Contiguous axial images were obtained from the base of the skull through the vertex without intravenous contrast. COMPARISON:  None. FINDINGS: Brain:  No  evidence of acute infarction, hemorrhage, hydrocephalus, extra-axial collection or mass lesion/mass effect. Few foci of white matter hypoattenuation are nonspecific but consistent with mild chronic microvascular ischemic changes. There is mild volume loss of the brain. Dystrophic calcifications within the basal ganglia noted. Vascular: Calcific atherosclerosis of the carotid siphons. No hyperdense vessel identified. Skull: Normal. Negative for fracture or focal lesion. Sinuses/Orbits: No acute finding. Other: None. ASPECTS [facility] Stroke Program Early CT Score) - Ganglionic level infarction (caudate, lentiform nuclei, internal capsule, insula, M1-M3 cortex): 7 - Supraganglionic infarction (M4-M6 cortex): 3 Total score (0-10 with 10 being normal): 10 IMPRESSION: 1. No acute intracranial abnormality identified. 2. ASPECTS is 10 3. Mild chronic microvascular ischemic changes and volume loss of the brain. These results were called by telephone at the time of interpretation on 01/05/2018 at 4:21 pm to Dr. Vaughan Basta , who verbally acknowledged these results. Electronically Signed   By: Kristine Garbe M.D.   On: 01/05/2018 16:24    ASSESSMENT AND PLAN:   Active Problems:   Hypercalcemia  Caleb Prigmore  is a 77 y.o. 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  *  Hypercalcemia-history of borderline hypercalcemia with calcium levels around 10-11 in the past.  on admission 13.2- down to 10.8. -IV fluids, May need Zometa per Dr. Rogue Bussing. Per Dr. Rogue Bussing, non-hyperparathyroidism hypercalcemia-given history of Hodgkin's lymphoma squamous cell cancer-malignant hypercalcemia should be  considered..  *  Acute encephalopathy-likely secondary to her hypercalcemia.  UA negative. Improved. *  Pancytopenia-previous history of leukopenia and borderline anemia.  Platelets also noted to be low this time.  Continue to monitor closely.  *  History of lung cancer-patient has history of left lower lobe nodule that is being monitored, previous biopsy confirmed squamous cell carcinoma and was treated with radiation only since it was stage I.  Also has right lower lobe nodule that was confirmed to be squamous cell carcinoma and finished radiation treatment for that as well.  Recent PET scan and April 2019 did not show any concerning findings.  * community acquired pneumonia- noted on CT chest. Continue rocephin + azithromycin  *  Hypertension-on carvedilol and benazepril.  *  History of bronchitis/sinusitis-she was on amoxicillin.  *  DVT prophylaxis-on Lovenox  Generalized weakness.  PT evaluation suggest home health and PT.  Slurred speech, unclear etiology. The patient was suspected stroke. CT head is negative.  MRI of the brain does not show any CVA.  Continue aspirin and Lipitor.  Follow-up with neurology consult.  Speech study suggested dysphagia 3 diet.   Discussed with Dr. Rogue Bussing. All the records are reviewed and case discussed with Care Management/Social Workerr. Management plans discussed with the patient, family and they are in agreement.  CODE STATUS: DNR  TOTAL TIME TAKING CARE OF THIS PATIENT: 35 minutes.     POSSIBLE D/C IN 1-2 DAYS, DEPENDING ON CLINICAL CONDITION.   Demetrios Loll M.D on 01/06/2018   Between 7am to 6pm - Pager - [phone]  After 6pm go to [email or web address] - password [email or web address] [facility]  Office  [phone]  CC: Primary care physician; Marinda Elk, MD  Note: This dictation was prepared with Dragon dictation along with smaller phrase technology. Any transcriptional  errors that result from this process  are unintentional.

## 2018-01-06 NOTE — Progress Notes (Signed)
Anticoagulation monitoring(Lovenox):  77 yo  female ordered Lovenox 30 mg Q24h (originally orderred 40 mg)  Filed Weights   01/03/18 1254 01/03/18 1714  Weight: 120 lb (54.4 kg) 130 lb 14.4 oz (59.4 kg)   Body mass index is 22.47 kg/m.   Lab Results  Component Value Date   CREATININE 1.19 (H) 01/06/2018   CREATININE 1.24 (H) 01/05/2018   CREATININE 1.24 (H) 01/04/2018   Estimated Creatinine Clearance: 34.2 mL/min (A) (by C-G formula based on SCr of 1.19 mg/dL (H)). Hemoglobin & Hematocrit     Component Value Date/Time   HGB 11.1 (L) 01/06/2018 1032   HGB 10.6 (L) 09/19/2014 1009   HCT 33.4 (L) 01/06/2018 1032   HCT 32.1 (L) 09/19/2014 1009     Per Protocol for Patient with estCrcl > 30 ml/min and BMI < 40, will transition to Lovenox 40 mg Q24h.

## 2018-01-06 NOTE — Progress Notes (Signed)
As per Dr. Bridgett Larsson patient had stroke last night.  Awaiting MRI brain.  Patient not in the room.  Today calcium is 11.2; normal albumin.  Review of blood work shows-low PTH-suggestive of non-hyperparathyroidism hypercalcemia-given history of Hodgkin's lymphoma squamous cell cancer-malignant hypercalcemia should be considered.  Ordered PTH related hormone; also vitamin D levels; multiple myeloma work up. Will follow in AM tomorrow.

## 2018-01-06 NOTE — Evaluation (Signed)
Clinical/Bedside Swallow Evaluation Patient Details  Name: Marie Lowe MRN: [id number] Date of Birth: [birth year]  Today's Date: 01/06/2018 Time: SLP Start Time (ACUTE ONLY): 0845 SLP Stop Time (ACUTE ONLY): 0925 SLP Time Calculation (min) (ACUTE ONLY): 40 min  Past Medical History:  Past Medical History:  Diagnosis Date  . Anemia   . Chest pain, unspecified   . Chronic kidney disease   . COPD (chronic obstructive pulmonary disease) ([place])   . Epistaxis   . Erosive gastropathy   . GERD (gastroesophageal reflux disease)   . High cholesterol   . Hodgkin's lymphoma ([place]) 2014   chemo  . Hodgkin's lymphoma ([place])   . Hypercalcemia   . Hypertension   . Lung cancer ([place]) 2015  . Pneumonia   . 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 Shortness of breath dyspnea    Past Surgical History:  Past Surgical History:  Procedure Laterality Date  . ABDOMINAL HYSTERECTOMY    . APPENDECTOMY    . COLONOSCOPY WITH PROPOFOL N/A 10/01/2015   Procedure: COLONOSCOPY WITH PROPOFOL;  Surgeon: Manya Silvas, MD;  [facility] ENDOSCOPY;  Service: Endoscopy;  Laterality: N/A;  . NASAL SINUS SURGERY    . PORTA CATH REMOVAL N/A 07/21/2016   Procedure: Glori Luis Cath Removal;  Surgeon: Algernon Huxley, MD;  Location: [facility] CV LAB;  Service: Cardiovascular;  Laterality: N/A;  . right lower lobe wedge resection     HPI:  Per admitting H&P: Marie Lowe  is a 77 y.o. 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   Assessment / Plan / Recommendation Clinical Impression  Patient presents with s/s of pharyngeal dysphagia at bedside c/b intermittent s/s aspiration with thin liquids only. Noted inconsistent  immediate wet congested coughing with thin liquids by cup, initially with multiple sips but then persisted with single sips. Patient tolerated sips of nectar thick liquid, puree and solid well with no s/s aspiration or laryngeal penetration. Oral phase appears WFL, min oral transit delay but with adequate oral prep and min oral residue which easily cleared with sips of liquid.  Oral mech exam largely WFL, noted mild to moderate bilateral lingual weakness that contributes to mild oral transit delay.  Vocal quality remained clear throughout evaluation. Recommend Dysphagia III diet with nectar thick liquids with strict aspiration precautions, with meds to be given whole in puree. Recommend no straws, small bites and sips by cup, alternating bites of solid with single sips of nectar thick liquid. SLP to f/u with toleration of diet and provide trials of upgraded consistency. SLP Visit Diagnosis: Dysphagia, oropharyngeal phase (R13.12)    Aspiration Risk  Mild aspiration risk    Diet Recommendation Dysphagia 3 (Mech soft);Nectar-thick liquid   Liquid Administration via: Cup Medication Administration: Whole meds with puree Supervision: Patient able to self feed Compensations: Minimize environmental distractions;Slow rate;Small sips/bites;Follow solids with liquid Postural Changes: Seated upright at 90 degrees;Remain upright for at least 30 minutes after po intake    Other  Recommendations Oral Care Recommendations: Oral care BID   Follow up Recommendations        Frequency and Duration min 1 x/week  1 week       Prognosis Prognosis for Safe Diet Advancement: Good  Swallow Study   General Date of Onset: 01/06/18 HPI: Per admitting H&P: Marie Lowe  is a 77 y.o. female with a known history of Hodgkin's lymphoma currently in remission, anemia of chronic disease, COPD not on home o2, Hypertension, prior history of hypercalcemia and history of stage I squamous cell lung cancer in both left  and right lower lobe status post radiation presents to hospital secondary to extreme weakness and intermittent confusion. Type of Study: Bedside Swallow Evaluation Diet Prior to this Study: NPO Behavior/Cognition: Alert;Cooperative;Pleasant mood Oral Cavity Assessment: Within Functional Limits Oral Cavity - Dentition: Dentures, top;Dentures, bottom;Other (Comment)(partials) Self-Feeding Abilities: Able to feed self Patient Positioning: Upright in bed Baseline Vocal Quality: Normal Volitional Cough: Wet;Congested    Oral/Motor/Sensory Function Overall Oral Motor/Sensory Function: Generalized oral weakness Facial ROM: Within Functional Limits Facial Symmetry: Within Functional Limits Facial Strength: Within Functional Limits Lingual ROM: Reduced right;Reduced left Lingual Symmetry: Within Functional Limits Lingual Strength: Reduced Mandible: Within Functional Limits   Ice Chips Ice chips: Within functional limits Presentation: Spoon   Thin Liquid Thin Liquid: Impaired Presentation: Cup;Spoon;Straw Pharyngeal  Phase Impairments: Cough - Immediate    Nectar Thick Nectar Thick Liquid: Within functional limits Presentation: Cup;Spoon   Honey Thick     Puree Puree: Within functional limits Presentation: Spoon   Solid     Solid: Within functional limits Presentation: Self Fed      Marie Hitson, MA, CCC-SLP 01/06/2018,9:42 AM

## 2018-01-06 NOTE — Consult Note (Signed)
Referring Physician: Anselm Jungling    Chief Complaint: Episode of slurred speech  HPI: Marie Lowe is an 77 y.o. female admitted with hypercalcemia who on yesterday had acute onset of slurred speech and confusion.  Symptoms resolved spontaneously and patient has remained stable.  Consult called for further recommendations.  Date last known well: Date: 01/05/2018 Time last known well: Time: 15:25 tPA Given: No: Resolution of symptoms  Past Medical History:  Diagnosis Date  . Anemia   . Chest pain, unspecified   . Chronic kidney disease   . COPD (chronic obstructive pulmonary disease) ([place])   . Epistaxis   . Erosive gastropathy   . GERD (gastroesophageal reflux disease)   . High cholesterol   . Hodgkin's lymphoma ([place]) 2014   chemo  . Hodgkin's lymphoma ([place])   . Hypercalcemia   . Hypertension   . Lung cancer ([place]) 2015  . Pneumonia   . 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 Shortness of breath dyspnea     Past Surgical History:  Procedure Laterality Date  . ABDOMINAL HYSTERECTOMY    . APPENDECTOMY    . COLONOSCOPY WITH PROPOFOL N/A 10/01/2015   Procedure: COLONOSCOPY WITH PROPOFOL;  Surgeon: Manya Silvas, MD;  Location: [facility] ENDOSCOPY;  Service: Endoscopy;  Laterality: N/A;  . NASAL SINUS SURGERY    . PORTA CATH REMOVAL N/A 07/21/2016   Procedure: Glori Luis Cath Removal;  Surgeon: Algernon Huxley, MD;  Location: [place] [facility] LAB;  Service: Cardiovascular;  Laterality: N/A;  . right lower lobe wedge resection      Family History  Problem Relation Age of Onset  . Diabetes Other   . CAD Other   . Cancer Other        colon cancer, breast cancer  . Kidney disease Other   . Breast cancer Maternal Aunt   . Prostate cancer Father   . Gastric cancer Mother   . Kidney cancer Neg Hx    Social History:  reports that she quit smoking about 6 years ago. Her smoking use included cigarettes. She has a 18.00 pack-year smoking history. She has never used smokeless tobacco. She reports  that she drinks about 1.0 standard drinks of alcohol per week. She reports that she does not use drugs.  Allergies:  Allergies  Allergen Reactions  . Hydrochlorothiazide Other (See Comments)    Elevation of creatinine Patient doesn't remember what reaction she had to it.    Medications:  I have reviewed the patient's current medications. Prior to Admission:  Medications Prior to Admission  Medication Sig Dispense Refill Last Dose  . albuterol (PROVENTIL HFA;VENTOLIN HFA) 108 (90 BASE) MCG/ACT inhaler Inhale 2 puffs into the lungs every 6 (six) hours as needed for wheezing or shortness of breath.   PRN at PRN  . amoxicillin (AMOXIL) 875 MG tablet Take 875 mg by mouth every 12 (twelve) hours.  0 01/03/2018 at 0800  . antiseptic oral rinse (BIOTENE) LIQD 15 mLs by Mouth Rinse route as needed for dry mouth.   PRN at PRN  . atorvastatin (LIPITOR) 10 MG tablet Take 10 mg by mouth every morning.    01/03/2018 at 0800  . benazepril (LOTENSIN) 10 MG tablet Take 10 mg by mouth daily.  5 01/03/2018 at 0800  . calcium carbonate (OS-CAL - DOSED IN MG OF ELEMENTAL CALCIUM) 1250 (500 Ca) MG tablet Take 1 tablet by mouth 2 (two) times daily with a meal.    01/03/2018 at 0800  . carvedilol (COREG) 6.25 MG  tablet Take 6.25 mg by mouth 2 (two) times daily.  5 01/03/2018 at 0800  . mirtazapine (REMERON) 15 MG tablet Take 15 mg by mouth at bedtime.    01/02/2018 at 2000  . Multiple Vitamins-Minerals (MULTIVITAMIN PO) Take 1 tablet by mouth daily.    01/03/2018 at 0800  . triamcinolone cream (KENALOG) 0.1 % Apply 1 application topically 2 (two) times daily.  1 As directed at As directed  . vitamin B-12 (CYANOCOBALAMIN) 1000 MCG tablet Take 1,000 mcg by mouth daily.   01/03/2018 at 0800   Scheduled: . aspirin  81 mg Oral Daily  . atorvastatin  40 mg Oral Daily  . benazepril  10 mg Oral Daily  . carvedilol  6.25 mg Oral BID  . docusate sodium  100 mg Oral BID  . enoxaparin (LOVENOX) injection  30 mg Subcutaneous Q24H  .  mirtazapine  15 mg Oral QHS  . vitamin B-12  1,000 mcg Oral Daily    ROS: History obtained from the patient  General ROS: negative for - chills, fatigue, fever, night sweats, weight gain or weight loss Psychological ROS: negative for - behavioral disorder, hallucinations, memory difficulties, mood swings or suicidal ideation Ophthalmic ROS: negative for - blurry vision, double vision, eye pain or loss of vision ENT ROS: negative for - epistaxis, nasal discharge, oral lesions, sore throat, tinnitus or vertigo Allergy and Immunology ROS: negative for - hives or itchy/watery eyes Hematological and Lymphatic ROS: negative for - bleeding problems, bruising or swollen lymph nodes Endocrine ROS: negative for - galactorrhea, hair pattern changes, polydipsia/polyuria or temperature intolerance Respiratory ROS: negative for - cough, hemoptysis, shortness of breath or wheezing Cardiovascular ROS: negative for - chest pain, dyspnea on exertion, edema or irregular heartbeat Gastrointestinal ROS: negative for - abdominal pain, diarrhea, hematemesis, nausea/vomiting or stool incontinence Genito-Urinary ROS: negative for - dysuria, hematuria, incontinence or urinary frequency/urgency Musculoskeletal ROS: negative for - joint swelling or muscular weakness Neurological ROS: as noted in HPI Dermatological ROS: negative for rash and skin lesion changes  Physical Examination: Blood pressure 139/80, pulse 85, temperature 98.5 F (36.9 C), temperature source Oral, resp. rate 20, height 5\' 4"  (1.626 m), weight 59.4 kg, SpO2 100 %.  HEENT-  Normocephalic, no lesions, without obvious abnormality.  Normal external eye and conjunctiva.  Normal TM's bilaterally.  Normal auditory canals and external ears. Normal external nose, mucus membranes and septum.  Normal pharynx. Cardiovascular- S1, S2 normal, pulses palpable throughout   Lungs- chest clear, no wheezing, rales, normal symmetric air entry Abdomen- soft,  non-tender; bowel sounds normal; no masses,  no organomegaly Extremities- no edema Lymph-no adenopathy palpable Musculoskeletal-no joint tenderness, deformity or swelling Skin-warm and dry, no hyperpigmentation, vitiligo, or suspicious lesions  Neurological Examination   Mental Status: Alert, oriented, thought content appropriate.  Speech fluent without evidence of aphasia.  Able to follow 3 step commands without difficulty. Cranial Nerves: II: Discs flat bilaterally; Visual fields grossly normal, pupils equal, round, reactive to light and accommodation III,IV, VI: ptosis not present, extra-ocular motions intact bilaterally V,VII: smile symmetric, facial light touch sensation normal bilaterally VIII: hearing normal bilaterally IX,X: gag reflex present XI: bilateral shoulder shrug XII: midline tongue extension Motor: Right : Upper extremity   5/5    Left:     Upper extremity   5/5  Lower extremity   5/5     Lower extremity   5/5 Tone and bulk:normal tone throughout; no atrophy noted Sensory: Pinprick and light touch intact throughout, bilaterally Deep Tendon  Reflexes: 2+ and symmetric with absent AJ's bilaterally Plantars: Right: mute   Left: mute Cerebellar: normal finger-to-nose, normal rapid alternating movements and normal heel-to-shin test Gait: not tested due to safety concerns    Laboratory Studies:  Basic Metabolic Panel: Recent Labs  Lab 01/03/18 1259 01/03/18 1424 01/04/18 0409 01/05/18 0439 01/06/18 1032  NA 139  --  140 141 139  K 3.6  --  3.5 3.8 3.5  CL 105  --  108 109 108  CO2 26  --  27 26 26   GLUCOSE 122*  --  96 104* 82  BUN 29*  --  26* 23 16  CREATININE 1.43*  --  1.24* 1.24* 1.19*  CALCIUM 13.2* 12.7* 11.3* 11.4* 10.8*  MG  --  1.6*  --   --   --   PHOS  --  2.9  --   --   --     Liver Function Tests: Recent Labs  Lab 01/03/18 1424  AST 41  ALT 26  ALKPHOS 216*  BILITOT 1.4*  PROT 6.9  ALBUMIN 3.7   Recent Labs  Lab 01/03/18 1424   LIPASE 26   No results for input(s): AMMONIA in the last 168 hours.  CBC: Recent Labs  Lab 01/03/18 1259 01/04/18 0409 01/06/18 1032  WBC 2.8* 2.4* 2.4*  NEUTROABS  --   --  0.6*  HGB 11.1* 10.0* 11.1*  HCT 32.9* 29.5* 33.4*  MCV 91.4 91.2 91.7  PLT 68* 63* 66*    Cardiac Enzymes: Recent Labs  Lab 01/03/18 1424  TROPONINI 0.03*    BNP: Invalid input(s): POCBNP  CBG: Recent Labs  Lab 01/03/18 2056 01/03/18 2058  GLUCAP 19* 30    Microbiology: No results found for this or any previous visit.  Coagulation Studies: Recent Labs    01/06/18 0502  LABPROT 15.3*  INR 1.22    Urinalysis:  Recent Labs  Lab 01/03/18 2016  COLORURINE YELLOW*  LABSPEC 1.010  PHURINE 6.0  GLUCOSEU NEGATIVE  HGBUR NEGATIVE  BILIRUBINUR NEGATIVE  KETONESUR NEGATIVE  PROTEINUR NEGATIVE  NITRITE NEGATIVE  LEUKOCYTESUR NEGATIVE    Lipid Panel:    Component Value Date/Time   CHOL 132 01/06/2018 1032   TRIG 105 01/06/2018 1032   HDL 28 (L) 01/06/2018 1032   CHOLHDL 4.7 01/06/2018 1032   VLDL 21 01/06/2018 1032   LDLCALC 83 01/06/2018 1032    HgbA1C: No results found for: HGBA1C  Urine Drug Screen:  No results found for: LABOPIA, COCAINSCRNUR, LABBENZ, AMPHETMU, THCU, LABBARB  Alcohol Level: No results for input(s): ETH in the last 168 hours.  Other results: EKG: normal sinus rhythm at 94 bpm.  Imaging: Ct Chest W Contrast  Result Date: 01/05/2018 CLINICAL DATA:  Hypercalcemia, history of lung cancer, Hodgkin's lymphoma, COPD, hypertension, former smoker EXAM: CT CHEST WITH CONTRAST TECHNIQUE: Multidetector CT imaging of the chest was performed during intravenous contrast administration. Sagittal and coronal MPR images reconstructed from axial data set. CONTRAST:  74mL OMNIPAQUE IOHEXOL 300 MG/ML SOLN IV. No oral contrast. COMPARISON:  PET/CT 09/15/2017 FINDINGS: Cardiovascular: Atherosclerotic calcifications aorta, coronary arteries and proximal great vessels. Aorta  normal caliber. Pulmonary arteries adequately opacified without gross filling defect by non dedicated exam. Minimal pericardial effusion. Mediastinum/Nodes: No esophageal abnormalities. Base of cervical region normal appearance. No thoracic adenopathy. Lungs/Pleura: Severe emphysematous changes. Central peribronchial thickening. Infiltrate identified in RIGHT lower lobe and posterior aspect of RIGHT upper lobe new since previous exam associated with thickening of the major and minor fissures  posteriorly in mid RIGHT chest. These findings could represent pneumonia but cannot exclude underlying tumor within these areas of consolidation in the posterior RIGHT lung. Subsegmental atelectasis lingula. Few calcified granulomata within RIGHT lower lobe. Small RIGHT pleural effusion. No pneumothorax. Minimal dependent atelectasis or infiltrate in LEFT lower lobe, potentially obscuring the previously seen LEFT lower lobe pulmonary nodule versus interval resolution of nodule. No additional mass/nodule. Paramediastinal fibrosis medial LEFT upper lobe. Upper Abdomen: Splenic enlargement. Triangular defect and splenic enhancement at medial spleen, could be an artifact related to early phase of imaging but a splenic infarct is not excluded, area in question 3.3 x 2.0 cm. Cortical scarring and cysts within the upper kidneys bilaterally. Remaining visualized upper abdomen unremarkable. Musculoskeletal: Demineralized without focal lesion. IMPRESSION: COPD changes with new infiltrates identified in the posterior mid light lung involving the RIGHT lower lobe and posterior RIGHT upper lobe, associated with pleural thickening at the posterior aspects of the RIGHT major and minor fissures; this could represent infection though underlying tumor is not excluded with this appearance. Chronic subsegmental atelectasis versus scarring in lingula. Previously identified LEFT lower lobe pulmonary nodule is in not visualized on current study,  question resolved versus obscured by mild atelectasis or infiltrate dependently in LEFT lower lobe. Minimal pericardial effusion. Splenomegaly with question new small splenic infarct versus flow artifact at medial spleen. Aortic Atherosclerosis (ICD10-I70.0) and Emphysema (ICD10-J43.9). These results will be called to the ordering clinician or representative by the Radiologist Assistant, and communication documented in the PACS or zVision Dashboard. Electronically Signed   By: Lavonia Dana M.D.   On: 01/05/2018 12:52   Mr Brain Wo Contrast  Result Date: 01/06/2018 CLINICAL DATA:  Unexplained dysphagia. Three 4 days of intermittent lightheadedness and dizziness EXAM: MRI HEAD WITHOUT CONTRAST TECHNIQUE: Multiplanar, multiecho pulse sequences of the brain and surrounding structures were obtained without intravenous contrast. COMPARISON:  Head CT from yesterday. FINDINGS: Brain: No acute infarction, hemorrhage, hydrocephalus, extra-axial collection or mass lesion. Age normal brain volume. Minimal for age signal abnormality in the cerebral white matter and pons, presumed remote microvascular insults. No chronic blood products. No finding in the brainstem, cisterns, or skull base to explain history of dysphagia. Vascular: Major flow voids are preserved Skull and upper cervical spine: No evident marrow lesion. The visible cervical disc spaces are narrowed with endplate spurring. Sinuses/Orbits: Mild mucosal thickening in the paranasal sinuses, mainly maxillary sinuses. Negative orbits. IMPRESSION: Unremarkable MRI of the brain for age.  No explanation for symptoms. Electronically Signed   By: Monte Fantasia M.D.   On: 01/06/2018 10:24   Ct Head Code Stroke Wo Contrast  Result Date: 01/05/2018 CLINICAL DATA:  Code stroke. 77 y/o F; episode of slurred speech and slow to respond starting 30 minutes ago. EXAM: CT HEAD WITHOUT CONTRAST TECHNIQUE: Contiguous axial images were obtained from the base of the skull through  the vertex without intravenous contrast. COMPARISON:  None. FINDINGS: Brain: No evidence of acute infarction, hemorrhage, hydrocephalus, extra-axial collection or mass lesion/mass effect. Few foci of white matter hypoattenuation are nonspecific but consistent with mild chronic microvascular ischemic changes. There is mild volume loss of the brain. Dystrophic calcifications within the basal ganglia noted. Vascular: Calcific atherosclerosis of the carotid siphons. No hyperdense vessel identified. Skull: Normal. Negative for fracture or focal lesion. Sinuses/Orbits: No acute finding. Other: None. ASPECTS [facility] Stroke Program Early CT Score) - Ganglionic level infarction (caudate, lentiform nuclei, internal capsule, insula, M1-M3 cortex): 7 - Supraganglionic infarction (M4-M6 cortex): 3 Total score (0-10  with 10 being normal): 10 IMPRESSION: 1. No acute intracranial abnormality identified. 2. ASPECTS is 10 3. Mild chronic microvascular ischemic changes and volume loss of the brain. These results were called by telephone at the time of interpretation on 01/05/2018 at 4:21 pm to Dr. Vaughan Basta , who verbally acknowledged these results. Electronically Signed   By: Kristine Garbe M.D.   On: 01/05/2018 16:24    Assessment: 77 y.o. female with episode of confusion and slurred speech on yesterday.  Episode spontaneously resolved.  Patient amnestic of event.  On no antiplatelet therapy due to low platelets.  MRI of the brain reviewed and shows no acute changes.  Can not rule out TIA vs seizure.    Stroke Risk Factors - hyperlipidemia and hypertension  Plan: 1. HgbA1c, fasting lipid panel 2. PT consult, OT consult, Speech consult 3. Echocardiogram 4. Carotid dopplers 5. Prophylactic therapy-None 6. NPO until RN stroke swallow screen 7. Telemetry monitoring 8. Frequent neuro checks 9. EEG   Alexis Goodell, MD Neurology [phone] 01/06/2018, 2:39 PM

## 2018-01-07 DIAGNOSIS — C3490 Malignant neoplasm of unspecified part of unspecified bronchus or lung: Secondary | ICD-10-CM

## 2018-01-07 DIAGNOSIS — D61818 Other pancytopenia: Secondary | ICD-10-CM

## 2018-01-07 DIAGNOSIS — Z8571 Personal history of Hodgkin lymphoma: Secondary | ICD-10-CM

## 2018-01-07 DIAGNOSIS — Z923 Personal history of irradiation: Secondary | ICD-10-CM

## 2018-01-07 LAB — BASIC METABOLIC PANEL
ANION GAP: 4 — AB (ref 5–15)
BUN: 17 mg/dL (ref 8–23)
CO2: 25 mmol/L (ref 22–32)
Calcium: 10.6 mg/dL — ABNORMAL HIGH (ref 8.9–10.3)
Chloride: 111 mmol/L (ref 98–111)
Creatinine, Ser: 1.16 mg/dL — ABNORMAL HIGH (ref 0.44–1.00)
GFR calc Af Amer: 51 mL/min — ABNORMAL LOW (ref >60)
GFR calc non Af Amer: 44 mL/min — ABNORMAL LOW (ref >60)
Glucose, Bld: 90 mg/dL (ref 70–99)
POTASSIUM: 3.6 mmol/L (ref 3.5–5.1)
Sodium: 140 mmol/L (ref 135–145)

## 2018-01-07 LAB — LIPID PANEL
CHOL/HDL RATIO: 4.3 ratio
Cholesterol: 95 mg/dL (ref 0–200)
HDL: 22 mg/dL — ABNORMAL LOW (ref >40)
LDL CALC: 59 mg/dL (ref 0–99)
Triglycerides: 68 mg/dL (ref <150)
VLDL: 14 mg/dL (ref 0–40)

## 2018-01-07 LAB — PTH-RELATED PEPTIDE

## 2018-01-07 LAB — HAPTOGLOBIN: HAPTOGLOBIN: 45 mg/dL (ref 34–200)

## 2018-01-07 MED ORDER — SODIUM CHLORIDE 0.9 % IV SOLN
1.0000 g | Freq: Every day | INTRAVENOUS | Status: DC
  Filled 2018-01-07 (×2): qty 10

## 2018-01-07 MED ORDER — CEFDINIR 300 MG PO CAPS: 300.0000 mg | ORAL_CAPSULE | Freq: Two times a day (BID) | ORAL | 0 refills | Status: DC

## 2018-01-07 MED ORDER — CEFDINIR 300 MG PO CAPS: 300.0000 mg | ORAL_CAPSULE | Freq: Two times a day (BID) | ORAL | Status: DC

## 2018-01-07 MED ORDER — AZITHROMYCIN 500 MG PO TABS: 500.0000 mg | ORAL_TABLET | Freq: Every day | ORAL | 0 refills | Status: DC

## 2018-01-07 MED ORDER — ATORVASTATIN CALCIUM 40 MG PO TABS: 40.0000 mg | ORAL_TABLET | Freq: Every day | ORAL | 0 refills | Status: AC

## 2018-01-07 MED ORDER — AZITHROMYCIN 500 MG PO TABS
500.0000 mg | ORAL_TABLET | Freq: Every day | ORAL | Status: DC
  Administered 2018-01-07: 08:00:00 500 mg via ORAL
  Filled 2018-01-07: qty 1

## 2018-01-07 NOTE — Progress Notes (Signed)
Marie Lowe   DOB:1940-08-25   JJ#:[id number]    Subjective: Patient sitting in the bed having her breakfast.  Lucid.  Denies any pain.  Eager to go home.  She did not have any further episodes of confusion or focal deficits.   Objective:  Vitals:   01/07/18 0417 01/07/18 0807  BP: (!) 136/56 (!) 168/72  Pulse: 71 79  Resp: 20 16  Temp: 97.8 F (36.6 C) 98 F (36.7 C)  SpO2: 100% 100%     Intake/Output Summary (Last 24 hours) at 01/07/2018 1142 Last data filed at 01/07/2018 1009 Gross per 24 hour  Intake 2795 ml  Output -  Net 2795 ml    GENERAL Alert, no distress and comfortable.  Alone.   EYES: no pallor or icterus OROPHARYNX: no thrush or ulceration. NECK: supple, no masses felt LYMPH:  no palpable lymphadenopathy in the cervical, axillary or inguinal regions LUNGS: decreased breath sounds to auscultation at bases and  No wheeze or crackles HEART/CVS: regular rate & rhythm and no murmurs; No lower extremity edema ABDOMEN: abdomen soft, tender  on deep palpation. and normal bowel sounds Musculoskeletal:no cyanosis of digits and no clubbing  PSYCH: alert & oriented x 3 with fluent speech NEURO: no focal motor/sensory deficits SKIN:  no rashes or significant lesions   Labs:  Lab Results  Component Value Date   WBC 2.4 (L) 01/06/2018   HGB 11.1 (L) 01/06/2018   HCT 33.4 (L) 01/06/2018   MCV 91.7 01/06/2018   PLT 66 (L) 01/06/2018   NEUTROABS 0.6 (L) 01/06/2018    Lab Results  Component Value Date   NA 140 01/07/2018   K 3.6 01/07/2018   CL 111 01/07/2018   CO2 25 01/07/2018    Studies:  Mr Brain Wo Contrast  Result Date: 01/06/2018 CLINICAL DATA:  Unexplained dysphagia. Three 4 days of intermittent lightheadedness and dizziness EXAM: MRI HEAD WITHOUT CONTRAST TECHNIQUE: Multiplanar, multiecho pulse sequences of the brain and surrounding structures were obtained without intravenous contrast. COMPARISON:  Head CT from yesterday. FINDINGS: Brain: No acute  infarction, hemorrhage, hydrocephalus, extra-axial collection or mass lesion. Age normal brain volume. Minimal for age signal abnormality in the cerebral white matter and pons, presumed remote microvascular insults. No chronic blood products. No finding in the brainstem, cisterns, or skull base to explain history of dysphagia. Vascular: Major flow voids are preserved Skull and upper cervical spine: No evident marrow lesion. The visible cervical disc spaces are narrowed with endplate spurring. Sinuses/Orbits: Mild mucosal thickening in the paranasal sinuses, mainly maxillary sinuses. Negative orbits. IMPRESSION: Unremarkable MRI of the brain for age.  No explanation for symptoms. Electronically Signed   By: Monte Fantasia M.D.   On: 01/06/2018 10:24   US Carotid Bilateral  Result Date: 01/07/2018 CLINICAL DATA:  TIA, dizziness, hypertension and hyperlipidemia. EXAM: BILATERAL CAROTID DUPLEX ULTRASOUND TECHNIQUE: Pearline Cables scale imaging, color Doppler and duplex ultrasound were performed of bilateral carotid and vertebral arteries in the neck. COMPARISON:  None. FINDINGS: Criteria: Quantification of carotid stenosis is based on velocity parameters that correlate the residual internal carotid diameter with NASCET-based stenosis levels, using the diameter of the distal internal carotid lumen as the denominator for stenosis measurement. The following velocity measurements were obtained: RIGHT ICA:  81/21 cm/sec CCA:  93/71 cm/sec SYSTOLIC ICA/CCA RATIO:  1.2 ECA:  86 cm/sec LEFT ICA:  103/22 cm/sec CCA:  69/67 cm/sec SYSTOLIC ICA/CCA RATIO:  1.5 ECA:  82 cm/sec RIGHT CAROTID ARTERY: There is a mild amount of  predominately calcified plaque at the level of the distal bulb and proximal right ICA. Estimated right ICA stenosis is less than 50%. RIGHT VERTEBRAL ARTERY: Antegrade flow with normal waveform and velocity. LEFT CAROTID ARTERY: Moderate calcified plaque at the level of the distal common carotid artery. Mild plaque  at the level of the carotid bulb and proximal ICA. Estimated left ICA stenosis is less than 50%. LEFT VERTEBRAL ARTERY: Antegrade flow with normal waveform and velocity. IMPRESSION: No significant evidence of ICA stenosis with estimated bilateral ICA stenoses of less than 50%. Plaque is present bilaterally with mild plaque at the level of the carotid bulbs and proximal internal carotid arteries bilaterally and moderate plaque in the distal aspect of the left common carotid artery. Electronically Signed   By: Aletta Edouard M.D.   On: 01/07/2018 08:37   Ct Head Code Stroke Wo Contrast  Result Date: 01/05/2018 CLINICAL DATA:  Code stroke. 77 y/o F; episode of slurred speech and slow to respond starting 30 minutes ago. EXAM: CT HEAD WITHOUT CONTRAST TECHNIQUE: Contiguous axial images were obtained from the base of the skull through the vertex without intravenous contrast. COMPARISON:  None. FINDINGS: Brain: No evidence of acute infarction, hemorrhage, hydrocephalus, extra-axial collection or mass lesion/mass effect. Few foci of white matter hypoattenuation are nonspecific but consistent with mild chronic microvascular ischemic changes. There is mild volume loss of the brain. Dystrophic calcifications within the basal ganglia noted. Vascular: Calcific atherosclerosis of the carotid siphons. No hyperdense vessel identified. Skull: Normal. Negative for fracture or focal lesion. Sinuses/Orbits: No acute finding. Other: None. ASPECTS [facility] Stroke Program Early CT Score) - Ganglionic level infarction (caudate, lentiform nuclei, internal capsule, insula, M1-M3 cortex): 7 - Supraganglionic infarction (M4-M6 cortex): 3 Total score (0-10 with 10 being normal): 10 IMPRESSION: 1. No acute intracranial abnormality identified. 2. ASPECTS is 10 3. Mild chronic microvascular ischemic changes and volume loss of the brain. These results were called by telephone at the time of interpretation on 01/05/2018 at 4:21 pm to Dr.  Vaughan Basta , who verbally acknowledged these results. Electronically Signed   By: Kristine Garbe M.D.   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  I think this will continue to improve.  Further work-up including-PTH related hormone/myeloma panel is pending.  Recurrence of malignancy should be excluded.  Patient is currently awaiting PET scan as an outpatient.   #Mild pancytopenia-likely secondary splenomegaly; question etiology.  Again await above work-up.  #History of squamous cell lung cancer status post radiation-reviewed the CAT scan-right lower lobe infiltrate-recurrent malignancy versus pneumonia [less likely] versus radiation changes [most likely].  Again await PET scan.  #Discussed with Dr. Bridgett Larsson.  Patient awaiting discharge home today.  Will need outpatient follow-up with Dr. Grayland Ormond.   Cammie Sickle, MD 01/07/2018  11:42 AM

## 2018-01-07 NOTE — Progress Notes (Signed)
Patient discharged home with spouse and daughter, verbalized understanding of education. Patient with no complaints.

## 2018-01-07 NOTE — Plan of Care (Signed)
  Problem: Education: Goal: Knowledge of General Education information will improve Description Including pain rating scale, medication(s)/side effects and non-pharmacologic comfort measures Outcome: Progressing   Problem: Health Behavior/Discharge Planning: Goal: Ability to manage health-related needs will improve Outcome: Progressing   Problem: Clinical Measurements: Goal: Ability to maintain clinical measurements within normal limits will improve Outcome: Progressing Goal: Will remain free from infection Outcome: Progressing Goal: Diagnostic test results will improve Outcome: Progressing Goal: Respiratory complications will improve Outcome: Progressing Goal: Cardiovascular complication will be avoided Outcome: Progressing   Problem: Activity: Goal: Risk for activity intolerance will decrease Outcome: Progressing   Problem: Nutrition: Goal: Adequate nutrition will be maintained Outcome: Progressing   Problem: Coping: Goal: Level of anxiety will decrease Outcome: Progressing   Problem: Elimination: Goal: Will not experience complications related to bowel motility Outcome: Progressing Goal: Will not experience complications related to urinary retention Outcome: Progressing   Problem: Pain Managment: Goal: General experience of comfort will improve Outcome: Progressing   Problem: Safety: Goal: Ability to remain free from injury will improve Outcome: Progressing   Problem: Skin Integrity: Goal: Risk for impaired skin integrity will decrease Outcome: Progressing   Problem: Spiritual Needs Goal: Ability to function at adequate level Outcome: Progressing   Problem: Education: Goal: Knowledge of disease or condition will improve Outcome: Progressing Goal: Knowledge of secondary prevention will improve Outcome: Progressing Goal: Knowledge of patient specific risk factors addressed and post discharge goals established will improve Outcome: Progressing Goal:  Individualized Educational Video(s) Outcome: Progressing   Problem: Coping: Goal: Will identify appropriate support needs Outcome: Progressing   Problem: Self-Care: Goal: Ability to participate in self-care as condition permits will improve Outcome: Progressing Goal: Ability to communicate needs accurately will improve Outcome: Progressing   Problem: Nutrition: Goal: Risk of aspiration will decrease Outcome: Progressing   Problem: Ischemic Stroke/TIA Tissue Perfusion: Goal: Complications of ischemic stroke/TIA will be minimized Outcome: Progressing

## 2018-01-07 NOTE — Progress Notes (Signed)
Received verbal order from Dr. Bridgett Larsson to discharge patient, stated the patient can have an echo in outpatient appointment.

## 2018-01-07 NOTE — Discharge Instructions (Signed)
HHPT Fall precaution dysphagia 3 diet.

## 2018-01-07 NOTE — Discharge Summary (Signed)
Marie Lowe at [place] NAME: Marie Lowe    MR#:  [id number]  DATE OF BIRTH:  [birth year]  DATE OF ADMISSION:  01/03/2018   ADMITTING PHYSICIAN: Gladstone Lighter, MD  DATE OF DISCHARGE:  01/07/2018  PRIMARY CARE PHYSICIAN: Marinda Elk, MD   ADMISSION DIAGNOSIS:  Hypercalcemia [E83.52] DISCHARGE DIAGNOSIS:  Active Problems:   Hypercalcemia  SECONDARY DIAGNOSIS:   Past Medical History:  Diagnosis Date  . Anemia   . Chest pain, unspecified   . Chronic kidney disease   . COPD (chronic obstructive pulmonary disease) ([place])   . Epistaxis   . Erosive gastropathy   . GERD (gastroesophageal reflux disease)   . High cholesterol   . Hodgkin's lymphoma ([place]) 2014   chemo  . Hodgkin's lymphoma ([place])   . Hypercalcemia   . Hypertension   . Lung cancer ([place]) 2015  . Pneumonia   . 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  * Hypercalcemia-history of borderline hypercalcemia with calcium levels around 10-11 in the past. on admission 13.2- down to 10.6. She has been treated with IV fluids and Zometa per Dr. Rogue Bussing. Per Dr. Rogue Bussing, non-hyperparathyroidism hypercalcemia-given history of Hodgkin's lymphoma squamous cell cancer-malignanthypercalcemia should be considered.  * Acute encephalopathy-likely secondary to her hypercalcemia. UA negative. Improved. * Pancytopenia-previous history of leukopenia and borderline anemia. Platelets also noted to be low this time. Continue to monitor closely.  *  History of lung cancer-patient has history of left lower lobe nodule that is being monitored, previous biopsy confirmed squamous cell carcinoma and was treated with radiation only since it was stage I. Also has right lower lobe nodule that was confirmed to be squamous cell carcinoma and finished radiation treatment for that as well. Recent PET scan and April 2019 did not show any concerning findings.  * community acquired pneumonia- noted on CT chest. She has been treated with Rocephin + azithromycin.  Changed to p.o. Zithromax and cefdinir.  * Hypertension-on carvedilol and benazepril.  * History of bronchitis/sinusitis-she was on amoxicillin.  * DVT prophylaxis-on Lovenox  Generalized weakness.  PT evaluation suggest home health and PT. but the patient is not interested in the service.  Slurred speech, unclear etiology, possible TIA. The patient was suspected stroke. CT head is negative.  MRI of the brain does not show any CVA.  She is treated with aspirin and Lipitor.    Hold aspirin due to thrombocytopenia.  Speech study suggested dysphagia 3 diet.  Discussed with Dr. Rogue Bussing. DISCHARGE CONDITIONS:  Stable, discharged to home and resume home health. CONSULTS OBTAINED:  Treatment Team:  Lloyd Huger, MD Catarina Hartshorn, MD Alexis Goodell, MD DRUG ALLERGIES:   Allergies  Allergen Reactions  . Hydrochlorothiazide Other (See Comments)    Elevation of creatinine Patient doesn't remember what reaction she had to it.   DISCHARGE MEDICATIONS:   Allergies as of 01/07/2018      Reactions   Hydrochlorothiazide Other (See Comments)   Elevation of creatinine Patient doesn't remember what reaction she had to it.      Medication List    STOP taking these medications  amoxicillin 875 MG tablet Commonly known as:  AMOXIL     TAKE these medications   albuterol 108 (90 Base) MCG/ACT inhaler Commonly known as:  PROVENTIL HFA;VENTOLIN HFA Inhale 2 puffs  into the lungs every 6 (six) hours as needed for wheezing or shortness of breath.   antiseptic oral rinse Liqd 15 mLs by Mouth Rinse route as needed for dry mouth.   atorvastatin 40 MG tablet Commonly known as:  LIPITOR Take 1 tablet (40 mg total) by mouth daily. Start taking on:  01/08/2018 What changed:    medication strength  how much to take  when to take this   azithromycin 500 MG tablet Commonly known as:  ZITHROMAX Take 1 tablet (500 mg total) by mouth daily. Start taking on:  01/08/2018   benazepril 10 MG tablet Commonly known as:  LOTENSIN Take 10 mg by mouth daily.   calcium carbonate 1250 (500 Ca) MG tablet Commonly known as:  OS-CAL - dosed in mg of elemental calcium Take 1 tablet by mouth 2 (two) times daily with a meal.   carvedilol 6.25 MG tablet Commonly known as:  COREG Take 6.25 mg by mouth 2 (two) times daily.   cefdinir 300 MG capsule Commonly known as:  OMNICEF Take 1 capsule (300 mg total) by mouth every 12 (twelve) hours. Start taking on:  01/08/2018   mirtazapine 15 MG tablet Commonly known as:  REMERON Take 15 mg by mouth at bedtime.   MULTIVITAMIN PO Take 1 tablet by mouth daily.   triamcinolone cream 0.1 % Commonly known as:  KENALOG Apply 1 application topically 2 (two) times daily.   vitamin B-12 1000 MCG tablet Commonly known as:  CYANOCOBALAMIN Take 1,000 mcg by mouth daily.        DISCHARGE INSTRUCTIONS:  See AVS.  If you experience worsening of your admission symptoms, develop shortness of breath, life threatening emergency, suicidal or homicidal thoughts you must seek medical attention immediately by calling 911 or calling your MD immediately  if symptoms less severe.  You Must read complete instructions/literature along with all the possible adverse reactions/side effects for all the Medicines you take and that have been prescribed to you. Take any new Medicines after you have completely understood and accpet all the  possible adverse reactions/side effects.   Please note  You were cared for by a hospitalist during your hospital stay. If you have any questions about your discharge medications or the care you received while you were in the hospital after you are discharged, you can call the unit and asked to speak with the hospitalist on call if the hospitalist that took care of you is not available. Once you are discharged, your primary care physician will handle any further medical issues. Please note that NO REFILLS for any discharge medications will be authorized once you are discharged, as it is imperative that you return to your primary care physician (or establish a relationship with a primary care physician if you do not have one) for your aftercare needs so that they can reassess your need for medications and monitor your lab values.    On the day of Discharge:  VITAL SIGNS:  Blood pressure (!) 168/72, pulse 79, temperature 98 F (36.7 C), temperature source Oral, resp. rate 16, height 5\' 4"  (1.626 m), weight 59.4 kg, SpO2 100 %. PHYSICAL EXAMINATION:  GENERAL:  77 y.o.-year-old patient lying in the bed with no acute distress.  EYES: Pupils equal, round, reactive to light and accommodation. No  scleral icterus. Extraocular muscles intact.  HEENT: Head atraumatic, normocephalic. Oropharynx and nasopharynx clear.  NECK:  Supple, no jugular venous distention. No thyroid enlargement, no tenderness.  LUNGS: Normal breath sounds bilaterally, no wheezing, rales,rhonchi or crepitation. No use of accessory muscles of respiration.  CARDIOVASCULAR: S1, S2 normal. No murmurs, rubs, or gallops.  ABDOMEN: Soft, non-tender, non-distended. Bowel sounds present. No organomegaly or mass.  EXTREMITIES: No pedal edema, cyanosis, or clubbing.  NEUROLOGIC: Cranial nerves II through XII are intact. Muscle strength 4/5 in all extremities. Sensation intact. Gait not checked.  PSYCHIATRIC: The patient is alert and oriented x  3.  SKIN: No obvious rash, lesion, or ulcer.  DATA REVIEW:   CBC Recent Labs  Lab 01/06/18 1032  WBC 2.4*  HGB 11.1*  HCT 33.4*  PLT 66*    Chemistries  Recent Labs  Lab 01/03/18 1424  01/07/18 0527  NA  --    < > 140  K  --    < > 3.6  CL  --    < > 111  CO2  --    < > 25  GLUCOSE  --    < > 90  BUN  --    < > 17  CREATININE  --    < > 1.16*  CALCIUM 12.7*   < > 10.6*  MG 1.6*  --   --   AST 41  --   --   ALT 26  --   --   ALKPHOS 216*  --   --   BILITOT 1.4*  --   --    < > = values in this interval not displayed.     Microbiology Results  No results found for this or any previous visit.  RADIOLOGY:  US Carotid Bilateral  Result Date: 01/07/2018 CLINICAL DATA:  TIA, dizziness, hypertension and hyperlipidemia. EXAM: BILATERAL CAROTID DUPLEX ULTRASOUND TECHNIQUE: Pearline Cables scale imaging, color Doppler and duplex ultrasound were performed of bilateral carotid and vertebral arteries in the neck. COMPARISON:  None. FINDINGS: Criteria: Quantification of carotid stenosis is based on velocity parameters that correlate the residual internal carotid diameter with NASCET-based stenosis levels, using the diameter of the distal internal carotid lumen as the denominator for stenosis measurement. The following velocity measurements were obtained: RIGHT ICA:  81/21 cm/sec CCA:  33/29 cm/sec SYSTOLIC ICA/CCA RATIO:  1.2 ECA:  86 cm/sec LEFT ICA:  103/22 cm/sec CCA:  51/88 cm/sec SYSTOLIC ICA/CCA RATIO:  1.5 ECA:  82 cm/sec RIGHT CAROTID ARTERY: There is a mild amount of predominately calcified plaque at the level of the distal bulb and proximal right ICA. Estimated right ICA stenosis is less than 50%. RIGHT VERTEBRAL ARTERY: Antegrade flow with normal waveform and velocity. LEFT CAROTID ARTERY: Moderate calcified plaque at the level of the distal common carotid artery. Mild plaque at the level of the carotid bulb and proximal ICA. Estimated left ICA stenosis is less than 50%. LEFT VERTEBRAL  ARTERY: Antegrade flow with normal waveform and velocity. IMPRESSION: No significant evidence of ICA stenosis with estimated bilateral ICA stenoses of less than 50%. Plaque is present bilaterally with mild plaque at the level of the carotid bulbs and proximal internal carotid arteries bilaterally and moderate plaque in the distal aspect of the left common carotid artery. Electronically Signed   By: Aletta Edouard M.D.   On: 01/07/2018 08:37     Management plans discussed with the patient, family and they are in agreement.  CODE STATUS: DNR   TOTAL  TIME TAKING CARE OF THIS PATIENT: 33 minutes.    Demetrios Loll M.D on 01/07/2018 at 11:07 AM  Between 7am to 6pm - Pager - [phone]  After 6pm go to [email or web address] - Proofreader  [facility]  Office  [phone]  CC: Primary care physician; Marinda Elk, MD   Note: This dictation was prepared with Dragon dictation along with smaller phrase technology. Any transcriptional errors that result from this process are unintentional.

## 2018-01-07 NOTE — Progress Notes (Signed)
Subjective: Patient reports no new neurological complaints.    Objective: Current vital signs: BP (!) 168/72 (BP Location: Left Arm)   Pulse 79   Temp 98 F (36.7 C) (Oral)   Resp 16   Ht 5\' 4"  (1.626 m)   Wt 59.4 kg   SpO2 100%   BMI 22.47 kg/m  Vital signs in last 24 hours: Temp:  [97.8 F (36.6 C)-98.7 F (37.1 C)] 98 F (36.7 C) (08/11 0807) Pulse Rate:  [71-88] 79 (08/11 0807) Resp:  [16-24] 16 (08/11 0807) BP: (134-181)/(51-84) 168/72 (08/11 0807) SpO2:  [100 %] 100 % (08/11 0807)  Intake/Output from previous day: 08/10 0701 - 08/11 0700 In: 2555 [I.V.:2555] Out: -  Intake/Output this shift: Total I/O In: 240 [P.O.:240] Out: -  Nutritional status:  Diet Order            Diet - low sodium heart healthy        DIET DYS 3 Room service appropriate? Yes; Fluid consistency: Nectar Thick  Diet effective now              Neurologic Exam: Mental Status: Alert, oriented, thought content appropriate.  Speech fluent without evidence of aphasia.  Able to follow 3 step commands without difficulty. Cranial Nerves: II: Discs flat bilaterally; Visual fields grossly normal, pupils equal, round, reactive to light and accommodation III,IV, VI: ptosis not present, extra-ocular motions intact bilaterally V,VII: smile symmetric, facial light touch sensation normal bilaterally VIII: hearing normal bilaterally IX,X: gag reflex present XI: bilateral shoulder shrug XII: midline tongue extension Motor: 5/5 throughout Sensory: Pinprick and light touch intact throughout, bilaterally Deep Tendon Reflexes: 2+ and symmetric throughout Plantars: Right: downgoing   Left: downgoing Cerebellar: normal finger-to-nose, normal rapid alternating movements and normal heel-to-shin test   Lab Results: Basic Metabolic Panel: Recent Labs  Lab 01/03/18 1259 01/03/18 1424 01/04/18 0409 01/05/18 0439 01/06/18 1032 01/07/18 0527  NA 139  --  140 141 139 140  K 3.6  --  3.5 3.8 3.5  3.6  CL 105  --  108 109 108 111  CO2 26  --  27 26 26 25   GLUCOSE 122*  --  96 104* 82 90  BUN 29*  --  26* 23 16 17   CREATININE 1.43*  --  1.24* 1.24* 1.19* 1.16*  CALCIUM 13.2* 12.7* 11.3* 11.4* 10.8* 10.6*  MG  --  1.6*  --   --   --   --   PHOS  --  2.9  --   --   --   --     Liver Function Tests: Recent Labs  Lab 01/03/18 1424  AST 41  ALT 26  ALKPHOS 216*  BILITOT 1.4*  PROT 6.9  ALBUMIN 3.7   Recent Labs  Lab 01/03/18 1424  LIPASE 26   No results for input(s): AMMONIA in the last 168 hours.  CBC: Recent Labs  Lab 01/03/18 1259 01/04/18 0409 01/06/18 1032  WBC 2.8* 2.4* 2.4*  NEUTROABS  --   --  0.6*  HGB 11.1* 10.0* 11.1*  HCT 32.9* 29.5* 33.4*  MCV 91.4 91.2 91.7  PLT 68* 63* 66*    Cardiac Enzymes: Recent Labs  Lab 01/03/18 1424  TROPONINI 0.03*    Lipid Panel: Recent Labs  Lab 01/06/18 1032 01/07/18 0527  CHOL 132 95  TRIG 105 68  HDL 28* 22*  CHOLHDL 4.7 4.3  VLDL 21 14  LDLCALC 83 59    CBG: Recent Labs  Lab 01/03/18  2056 01/03/18 2058  GLUCAP 69* 87    Microbiology: No results found for this or any previous visit.  Coagulation Studies: Recent Labs    01/06/18 0502  LABPROT 15.3*  INR 1.22    Imaging: Mr Brain Wo Contrast  Result Date: 01/06/2018 CLINICAL DATA:  Unexplained dysphagia. Three 4 days of intermittent lightheadedness and dizziness EXAM: MRI HEAD WITHOUT CONTRAST TECHNIQUE: Multiplanar, multiecho pulse sequences of the brain and surrounding structures were obtained without intravenous contrast. COMPARISON:  Head CT from yesterday. FINDINGS: Brain: No acute infarction, hemorrhage, hydrocephalus, extra-axial collection or mass lesion. Age normal brain volume. Minimal for age signal abnormality in the cerebral white matter and pons, presumed remote microvascular insults. No chronic blood products. No finding in the brainstem, cisterns, or skull base to explain history of dysphagia. Vascular: Major flow voids are  preserved Skull and upper cervical spine: No evident marrow lesion. The visible cervical disc spaces are narrowed with endplate spurring. Sinuses/Orbits: Mild mucosal thickening in the paranasal sinuses, mainly maxillary sinuses. Negative orbits. IMPRESSION: Unremarkable MRI of the brain for age.  No explanation for symptoms. Electronically Signed   By: Monte Fantasia M.D.   On: 01/06/2018 10:24   US Carotid Bilateral  Result Date: 01/07/2018 CLINICAL DATA:  TIA, dizziness, hypertension and hyperlipidemia. EXAM: BILATERAL CAROTID DUPLEX ULTRASOUND TECHNIQUE: Pearline Cables scale imaging, color Doppler and duplex ultrasound were performed of bilateral carotid and vertebral arteries in the neck. COMPARISON:  None. FINDINGS: Criteria: Quantification of carotid stenosis is based on velocity parameters that correlate the residual internal carotid diameter with NASCET-based stenosis levels, using the diameter of the distal internal carotid lumen as the denominator for stenosis measurement. The following velocity measurements were obtained: RIGHT ICA:  81/21 cm/sec CCA:  24/58 cm/sec SYSTOLIC ICA/CCA RATIO:  1.2 ECA:  86 cm/sec LEFT ICA:  103/22 cm/sec CCA:  09/98 cm/sec SYSTOLIC ICA/CCA RATIO:  1.5 ECA:  82 cm/sec RIGHT CAROTID ARTERY: There is a mild amount of predominately calcified plaque at the level of the distal bulb and proximal right ICA. Estimated right ICA stenosis is less than 50%. RIGHT VERTEBRAL ARTERY: Antegrade flow with normal waveform and velocity. LEFT CAROTID ARTERY: Moderate calcified plaque at the level of the distal common carotid artery. Mild plaque at the level of the carotid bulb and proximal ICA. Estimated left ICA stenosis is less than 50%. LEFT VERTEBRAL ARTERY: Antegrade flow with normal waveform and velocity. IMPRESSION: No significant evidence of ICA stenosis with estimated bilateral ICA stenoses of less than 50%. Plaque is present bilaterally with mild plaque at the level of the carotid bulbs  and proximal internal carotid arteries bilaterally and moderate plaque in the distal aspect of the left common carotid artery. Electronically Signed   By: Aletta Edouard M.D.   On: 01/07/2018 08:37   Ct Head Code Stroke Wo Contrast  Result Date: 01/05/2018 CLINICAL DATA:  Code stroke. 77 y/o F; episode of slurred speech and slow to respond starting 30 minutes ago. EXAM: CT HEAD WITHOUT CONTRAST TECHNIQUE: Contiguous axial images were obtained from the base of the skull through the vertex without intravenous contrast. COMPARISON:  None. FINDINGS: Brain: No evidence of acute infarction, hemorrhage, hydrocephalus, extra-axial collection or mass lesion/mass effect. Few foci of white matter hypoattenuation are nonspecific but consistent with mild chronic microvascular ischemic changes. There is mild volume loss of the brain. Dystrophic calcifications within the basal ganglia noted. Vascular: Calcific atherosclerosis of the carotid siphons. No hyperdense vessel identified. Skull: Normal. Negative for fracture or focal  lesion. Sinuses/Orbits: No acute finding. Other: None. ASPECTS [facility] Stroke Program Early CT Score) - Ganglionic level infarction (caudate, lentiform nuclei, internal capsule, insula, M1-M3 cortex): 7 - Supraganglionic infarction (M4-M6 cortex): 3 Total score (0-10 with 10 being normal): 10 IMPRESSION: 1. No acute intracranial abnormality identified. 2. ASPECTS is 10 3. Mild chronic microvascular ischemic changes and volume loss of the brain. These results were called by telephone at the time of interpretation on 01/05/2018 at 4:21 pm to Dr. Vaughan Basta , who verbally acknowledged these results. Electronically Signed   By: Kristine Garbe M.D.   On: 01/05/2018 16:24    Medications:  I have reviewed the patient's current medications. Scheduled: . aspirin  81 mg Oral Daily  . atorvastatin  40 mg Oral Daily  . azithromycin  500 mg Oral Daily  . benazepril  10 mg Oral Daily  .  carvedilol  6.25 mg Oral BID  . [START ON 01/08/2018] cefdinir  300 mg Oral Q12H  . docusate sodium  100 mg Oral BID  . enoxaparin (LOVENOX) injection  40 mg Subcutaneous Q24H  . mirtazapine  15 mg Oral QHS  . vitamin B-12  1,000 mcg Oral Daily    Assessment/Plan: Patient stable.  No new neurological complaints.  Platelet count remains low.  Carotid dopplers show no evidence of hemodynamically significant stenosis.  Echocardiogram pending.  A1c pending, LDL 59 on a statin.  Echocardiogram and EEG are pending.   Recommendations: 1. If EEG and echocardiogram are unremarkable no further neurologic intervention is recommended at this time.  If further questions arise, please call or page at that time.  Thank you for allowing neurology to participate in the care of this patient.    LOS: 4 days   Alexis Goodell, MD Neurology [phone] 01/07/2018  1:40 PM

## 2018-01-07 NOTE — Care Management (Signed)
RNCM following for transtion of care. Patient has previous recommendations from PT for home health. Patient feels like she has progressed since her last session and is not interested in services. Patient states that she has assistance from her husband, sister and daughter as needed. Uses a cane and has walker in the home. Nocturnal oxygen through [employer]. Family to provide transport. Ines Bloomer RN BSN RNCM [phone]

## 2018-01-08 LAB — CALCITRIOL (1,25 DI-OH VIT D): Vit D, 1,25-Dihydroxy: 105 pg/mL — ABNORMAL HIGH (ref 19.9–79.3)

## 2018-01-08 LAB — VITAMIN D 25 HYDROXY (VIT D DEFICIENCY, FRACTURES): Vit D, 25-Hydroxy: 44.7 ng/mL (ref 30.0–100.0)

## 2018-01-09 LAB — MULTIPLE MYELOMA PANEL, SERUM
ALBUMIN SERPL ELPH-MCNC: 2.9 g/dL (ref 2.9–4.4)
ALPHA 1: 0.3 g/dL (ref 0.0–0.4)
Albumin/Glob SerPl: 1.1 (ref 0.7–1.7)
Alpha2 Glob SerPl Elph-Mcnc: 0.6 g/dL (ref 0.4–1.0)
B-GLOBULIN SERPL ELPH-MCNC: 0.7 g/dL (ref 0.7–1.3)
Gamma Glob SerPl Elph-Mcnc: 1.3 g/dL (ref 0.4–1.8)
Globulin, Total: 2.8 g/dL (ref 2.2–3.9)
IGA: 107 mg/dL (ref 64–422)
IgG (Immunoglobin G), Serum: 1314 mg/dL (ref 700–1600)
IgM (Immunoglobulin M), Srm: 18 mg/dL — ABNORMAL LOW (ref 26–217)
Total Protein ELP: 5.7 g/dL — ABNORMAL LOW (ref 6.0–8.5)

## 2018-01-09 LAB — KAPPA/LAMBDA LIGHT CHAINS
Kappa free light chain: 41.4 mg/L — ABNORMAL HIGH (ref 3.3–19.4)
Kappa, lambda light chain ratio: 1.48 (ref 0.26–1.65)
Lambda free light chains: 28 mg/L — ABNORMAL HIGH (ref 5.7–26.3)

## 2018-01-09 LAB — HEMOGLOBIN A1C
HEMOGLOBIN A1C: 5.2 % (ref 4.8–5.6)
Mean Plasma Glucose: 103 mg/dL

## 2018-01-11 ENCOUNTER — Ambulatory Visit: Payer: Medicare Other | Admitting: Urology

## 2018-01-11 LAB — PTH-RELATED PEPTIDE: PTH-related peptide: <2 pmol/L

## 2018-01-12 ENCOUNTER — Telehealth: Payer: Self-pay | Admitting: Urology

## 2018-01-12 NOTE — Telephone Encounter (Signed)
I put in a new order.  Strange cause the old order is still there and does not say expired.  Hollice Espy, MD

## 2018-01-12 NOTE — Telephone Encounter (Signed)
Got a message from scheduling that the order for the [facility] for this patient had expired and they can't schedule without a new order. Can you please put in a new order?   Thanks, Sharyn Lull

## 2018-01-14 ENCOUNTER — Inpatient Hospital Stay
Admission: EM | Admit: 2018-01-14 | Discharge: 2018-01-17 | DRG: 070 | Disposition: A | Discharge status: Skilled Nursing Facility | Payer: Medicare Other | Attending: Internal Medicine | Admitting: Internal Medicine

## 2018-01-14 ENCOUNTER — Emergency Department: Payer: Medicare Other

## 2018-01-14 ENCOUNTER — Encounter: Payer: Self-pay | Admitting: Emergency Medicine

## 2018-01-14 ENCOUNTER — Inpatient Hospital Stay: Payer: Medicare Other

## 2018-01-14 DIAGNOSIS — R4182 Altered mental status, unspecified: Secondary | ICD-10-CM | POA: Diagnosis not present

## 2018-01-14 DIAGNOSIS — I129 Hypertensive chronic kidney disease with stage 1 through stage 4 chronic kidney disease, or unspecified chronic kidney disease: Secondary | ICD-10-CM | POA: Diagnosis present

## 2018-01-14 DIAGNOSIS — R1310 Dysphagia, unspecified: Secondary | ICD-10-CM | POA: Diagnosis present

## 2018-01-14 DIAGNOSIS — J189 Pneumonia, unspecified organism: Secondary | ICD-10-CM | POA: Diagnosis present

## 2018-01-14 DIAGNOSIS — D696 Thrombocytopenia, unspecified: Secondary | ICD-10-CM | POA: Diagnosis present

## 2018-01-14 DIAGNOSIS — J440 Chronic obstructive pulmonary disease with acute lower respiratory infection: Secondary | ICD-10-CM | POA: Diagnosis present

## 2018-01-14 DIAGNOSIS — Z66 Do not resuscitate: Secondary | ICD-10-CM | POA: Diagnosis present

## 2018-01-14 DIAGNOSIS — I160 Hypertensive urgency: Secondary | ICD-10-CM | POA: Diagnosis not present

## 2018-01-14 DIAGNOSIS — N179 Acute kidney failure, unspecified: Secondary | ICD-10-CM | POA: Diagnosis present

## 2018-01-14 DIAGNOSIS — E43 Unspecified severe protein-calorie malnutrition: Secondary | ICD-10-CM | POA: Diagnosis present

## 2018-01-14 DIAGNOSIS — K219 Gastro-esophageal reflux disease without esophagitis: Secondary | ICD-10-CM | POA: Diagnosis present

## 2018-01-14 DIAGNOSIS — Z85118 Personal history of other malignant neoplasm of bronchus and lung: Secondary | ICD-10-CM

## 2018-01-14 DIAGNOSIS — R410 Disorientation, unspecified: Secondary | ICD-10-CM

## 2018-01-14 DIAGNOSIS — R627 Adult failure to thrive: Secondary | ICD-10-CM | POA: Diagnosis present

## 2018-01-14 DIAGNOSIS — R441 Visual hallucinations: Secondary | ICD-10-CM | POA: Diagnosis present

## 2018-01-14 DIAGNOSIS — N183 Chronic kidney disease, stage 3 (moderate): Secondary | ICD-10-CM | POA: Diagnosis present

## 2018-01-14 DIAGNOSIS — F0390 Unspecified dementia without behavioral disturbance: Secondary | ICD-10-CM

## 2018-01-14 DIAGNOSIS — Z6822 Body mass index (BMI) 22.0-22.9, adult: Secondary | ICD-10-CM | POA: Diagnosis not present

## 2018-01-14 DIAGNOSIS — E876 Hypokalemia: Secondary | ICD-10-CM | POA: Diagnosis present

## 2018-01-14 DIAGNOSIS — Z87891 Personal history of nicotine dependence: Secondary | ICD-10-CM

## 2018-01-14 DIAGNOSIS — E7800 Pure hypercholesterolemia, unspecified: Secondary | ICD-10-CM | POA: Diagnosis present

## 2018-01-14 DIAGNOSIS — Z888 Allergy status to other drugs, medicaments and biological substances status: Secondary | ICD-10-CM

## 2018-01-14 DIAGNOSIS — Z8571 Personal history of Hodgkin lymphoma: Secondary | ICD-10-CM | POA: Diagnosis not present

## 2018-01-14 DIAGNOSIS — F0391 Unspecified dementia with behavioral disturbance: Secondary | ICD-10-CM | POA: Diagnosis present

## 2018-01-14 DIAGNOSIS — Z79899 Other long term (current) drug therapy: Secondary | ICD-10-CM

## 2018-01-14 DIAGNOSIS — G9349 Other encephalopathy: Secondary | ICD-10-CM | POA: Diagnosis present

## 2018-01-14 DIAGNOSIS — F05 Delirium due to known physiological condition: Secondary | ICD-10-CM | POA: Diagnosis present

## 2018-01-14 DIAGNOSIS — E860 Dehydration: Secondary | ICD-10-CM | POA: Diagnosis present

## 2018-01-14 DIAGNOSIS — G9340 Encephalopathy, unspecified: Secondary | ICD-10-CM | POA: Diagnosis present

## 2018-01-14 LAB — CBC WITH DIFFERENTIAL/PLATELET
BAND NEUTROPHILS: 3 %
BASOS PCT: 0 %
BLASTS: 0 %
Basophils Absolute: 0 10*3/uL (ref 0–0.1)
EOS ABS: 0.2 10*3/uL (ref 0–0.7)
Eosinophils Relative: 4 %
HCT: 34.1 % — ABNORMAL LOW (ref 35.0–47.0)
HEMOGLOBIN: 11.7 g/dL — AB (ref 12.0–16.0)
Lymphocytes Relative: 67 %
Lymphs Abs: 2.5 10*3/uL (ref 1.0–3.6)
MCH: 31.4 pg (ref 26.0–34.0)
MCHC: 34.2 g/dL (ref 32.0–36.0)
MCV: 91.7 fL (ref 80.0–100.0)
MONO ABS: 0.5 10*3/uL (ref 0.2–0.9)
MYELOCYTES: 0 %
Metamyelocytes Relative: 0 %
Monocytes Relative: 13 %
NEUTROS PCT: 13 %
Neutro Abs: 0.6 10*3/uL — ABNORMAL LOW (ref 1.4–6.5)
Other: 0 %
PLATELETS: 57 10*3/uL — AB (ref 150–440)
PROMYELOCYTES RELATIVE: 0 %
RBC: 3.72 MIL/uL — ABNORMAL LOW (ref 3.80–5.20)
RDW: 20.2 % — ABNORMAL HIGH (ref 11.5–14.5)
WBC: 3.8 10*3/uL (ref 3.6–11.0)
nRBC: 0 /100 WBC

## 2018-01-14 LAB — COMPREHENSIVE METABOLIC PANEL
ALK PHOS: 205 U/L — AB (ref 38–126)
ALT: 25 U/L (ref 0–44)
ANION GAP: 11 (ref 5–15)
AST: 43 U/L — ABNORMAL HIGH (ref 15–41)
Albumin: 3.6 g/dL (ref 3.5–5.0)
BILIRUBIN TOTAL: 2.1 mg/dL — AB (ref 0.3–1.2)
BUN: 16 mg/dL (ref 8–23)
CALCIUM: 10 mg/dL (ref 8.9–10.3)
CO2: 24 mmol/L (ref 22–32)
Chloride: 105 mmol/L (ref 98–111)
Creatinine, Ser: 1.61 mg/dL — ABNORMAL HIGH (ref 0.44–1.00)
GFR calc Af Amer: 34 mL/min — ABNORMAL LOW (ref >60)
GFR, EST NON AFRICAN AMERICAN: 30 mL/min — AB (ref >60)
GLUCOSE: 102 mg/dL — AB (ref 70–99)
POTASSIUM: 3.2 mmol/L — AB (ref 3.5–5.1)
Sodium: 140 mmol/L (ref 135–145)
TOTAL PROTEIN: 6.5 g/dL (ref 6.5–8.1)

## 2018-01-14 LAB — URINALYSIS, COMPLETE (UACMP) WITH MICROSCOPIC
Bacteria, UA: NONE SEEN
Bilirubin Urine: NEGATIVE
GLUCOSE, UA: NEGATIVE mg/dL
Ketones, ur: 5 mg/dL — AB
Leukocytes, UA: NEGATIVE
NITRITE: NEGATIVE
PH: 8 (ref 5.0–8.0)
Protein, ur: 30 mg/dL — AB
SPECIFIC GRAVITY, URINE: 1.008 (ref 1.005–1.030)

## 2018-01-14 LAB — LACTIC ACID, PLASMA: LACTIC ACID, VENOUS: 1.4 mmol/L (ref 0.5–1.9)

## 2018-01-14 LAB — PROTIME-INR
INR: 1.14
PROTHROMBIN TIME: 14.5 s (ref 11.4–15.2)

## 2018-01-14 LAB — TROPONIN I: Troponin I: 0.06 ng/mL (ref <0.03)

## 2018-01-14 MED ORDER — VANCOMYCIN HCL 10 G IV SOLR
1250.0000 mg | Freq: Once | INTRAVENOUS | Status: AC
  Administered 2018-01-15: 1250 mg via INTRAVENOUS
  Filled 2018-01-14: qty 1250

## 2018-01-14 MED ORDER — SODIUM CHLORIDE 0.9 % IV SOLN
500.0000 mg | INTRAVENOUS | Status: DC
  Administered 2018-01-14: 500 mg via INTRAVENOUS

## 2018-01-14 MED ORDER — SODIUM CHLORIDE 0.9 % IV SOLN
INTRAVENOUS | Status: AC
  Filled 2018-01-14: qty 10

## 2018-01-14 MED ORDER — SODIUM CHLORIDE 0.9 % IV SOLN
1.0000 g | INTRAVENOUS | Status: DC
  Administered 2018-01-14: 1 g via INTRAVENOUS

## 2018-01-14 MED ORDER — LABETALOL HCL 5 MG/ML IV SOLN
INTRAVENOUS | Status: AC
  Filled 2018-01-14: qty 4

## 2018-01-14 MED ORDER — SODIUM CHLORIDE 0.9 % IV SOLN
INTRAVENOUS | Status: AC
  Filled 2018-01-14: qty 500

## 2018-01-14 MED ORDER — SODIUM CHLORIDE 0.9 % IV BOLUS
1000.0000 mL | Freq: Once | INTRAVENOUS | Status: AC
  Administered 2018-01-14: 1000 mL via INTRAVENOUS

## 2018-01-14 MED ORDER — POTASSIUM CHLORIDE IN NACL 20-0.9 MEQ/L-% IV SOLN
INTRAVENOUS | Status: DC
  Administered 2018-01-15 – 2018-01-17 (×4): via INTRAVENOUS
  Filled 2018-01-14 (×5): qty 1000

## 2018-01-14 MED ORDER — LABETALOL HCL 5 MG/ML IV SOLN
10.0000 mg | INTRAVENOUS | Status: DC | PRN
  Administered 2018-01-14 – 2018-01-15 (×2): 10 mg via INTRAVENOUS
  Filled 2018-01-14: qty 4

## 2018-01-14 MED ORDER — PIPERACILLIN-TAZOBACTAM 3.375 G IVPB
3.3750 g | Freq: Two times a day (BID) | INTRAVENOUS | Status: DC
  Administered 2018-01-15: 3.375 g via INTRAVENOUS
  Filled 2018-01-14: qty 50

## 2018-01-14 NOTE — Progress Notes (Addendum)
On 01/11/18 was notified by patient's family member that patient was discharged on 01/07/18 with 2 IVs in her arm. Kite RN sent to house to remove IVs.RN reported 2 IV catheters were removed intact, IV sites unremarkable prior to removal.Informed patient's RN.

## 2018-01-14 NOTE — ED Notes (Signed)
Date and time results received: 01/14/18 1840  Test: troponin Critical Value:0.06  Name of Provider Notified:MD [employer]

## 2018-01-14 NOTE — ED Notes (Signed)
Pt found in wet bed. Pt and linen cleaned and changed at this time.

## 2018-01-14 NOTE — ED Provider Notes (Signed)
[facility] Emergency Department Provider Note  ___________________________________________   First MD Initiated Contact with Patient 01/14/18 1724     (approximate)  I have reviewed the triage vital signs and the nursing notes.   HISTORY  Chief Complaint Altered Mental Status   HPI Marie Lowe is a 77 y.o. female with a history of COPD, Hodgkin's lymphoma as well as recent admission for hypercalcemia and community acquired pneumonia was presented with altered mental status.  EMS reports confusion that is worsened since being discharged approximately 1 week ago.  No focal findings in route.  Patient without any complaints at this time.  Denies any pain.  Past Medical History:  Diagnosis Date  . Anemia   . Chest pain, unspecified   . Chronic kidney disease   . COPD (chronic obstructive pulmonary disease) ([place])   . Epistaxis   . Erosive gastropathy   . GERD (gastroesophageal reflux disease)   . High cholesterol   . Hodgkin's lymphoma ([place]) 2014   chemo  . Hodgkin's lymphoma ([place])   . Hypercalcemia   . Hypertension   . Lung cancer ([place]) 2015  . Pneumonia   . 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 Shortness of breath dyspnea     Patient Active Problem List   Diagnosis Date Noted  . Hypercalcemia 01/03/2018  . Neutropenia ([place]) 01/08/2017  . Elevated alkaline phosphatase level 01/08/2017  . COPD exacerbation ([place]) 07/12/2016  . Hyperlipidemia 07/12/2016  . Primary cancer of left upper lobe of lung ([place]) 01/24/2016  . Pulmonary nodule, right 01/24/2016  . Hodgkin's lymphoma ([place]) 01/24/2016    Past Surgical History:  Procedure Laterality Date  . ABDOMINAL HYSTERECTOMY    . APPENDECTOMY    . COLONOSCOPY WITH PROPOFOL N/A 10/01/2015   Procedure: COLONOSCOPY WITH PROPOFOL;  Surgeon: Manya Silvas, MD;  Location: [facility] ENDOSCOPY;  Service: Endoscopy;  Laterality: N/A;  . NASAL SINUS SURGERY    . PORTA CATH REMOVAL N/A 07/21/2016   Procedure: Glori Luis Cath Removal;  Surgeon: Algernon Huxley, MD;  Location: [facility] CV LAB;  Service: Cardiovascular;  Laterality: N/A;  . right lower lobe wedge resection      Prior to Admission medications   Medication Sig Start Date End Date Taking? Authorizing Provider  albuterol (PROVENTIL HFA;VENTOLIN HFA) 108 (90 BASE) MCG/ACT inhaler Inhale 2 puffs into the lungs every 6 (six) hours as needed for wheezing or shortness of breath.    [provider]  antiseptic oral rinse (BIOTENE) LIQD 15 mLs by Mouth Rinse route as needed for dry mouth.    [provider]  atorvastatin (LIPITOR) 40 MG tablet Take 1 tablet (40 mg total) by mouth daily. 01/08/18   Demetrios Loll, MD  azithromycin (ZITHROMAX) 500 MG tablet Take 1 tablet (500 mg total) by mouth daily. 01/08/18   Demetrios Loll, MD  benazepril (LOTENSIN) 10 MG tablet Take 10 mg by mouth daily. 12/19/17   [provider]  calcium carbonate (OS-CAL - DOSED IN MG OF ELEMENTAL CALCIUM) 1250 (500 Ca) MG tablet Take 1 tablet by mouth 2 (two) times daily with a meal.     [provider]  carvedilol (COREG) 6.25 MG tablet Take 6.25 mg by mouth 2 (two) times daily. 11/02/14   [provider]  cefdinir (OMNICEF) 300 MG capsule Take 1 capsule (300 mg total) by mouth every 12 (twelve) hours. 01/08/18   Demetrios Loll, MD  mirtazapine (REMERON) 15 MG tablet Take 15 mg by mouth at bedtime.  01/04/17   [provider]  Multiple Vitamins-Minerals (MULTIVITAMIN PO) Take 1 tablet by mouth daily.     [provider]  triamcinolone cream (KENALOG) 0.1 % Apply 1 application topically 2 (two) times daily. 12/18/17   [provider]  vitamin B-12 (CYANOCOBALAMIN) 1000 MCG tablet Take 1,000 mcg by mouth daily.    [provider]    Allergies Hydrochlorothiazide  Family History  Problem Relation Age of Onset  . Diabetes Other   . CAD Other   . Cancer Other        colon cancer, breast cancer  . Kidney  disease Other   . Breast cancer Maternal Aunt   . Prostate cancer Father   . Gastric cancer Mother   . Kidney cancer Neg Hx     Social History Social History   Tobacco Use  . Smoking status: Former Smoker    Packs/day: 1.00    Years: 18.00    Pack years: 18.00    Types: Cigarettes    Last attempt to quit: 2013    Years since quitting: 6.6  . Smokeless tobacco: Never Used  Substance Use Topics  . Alcohol use: Yes    Alcohol/week: 1.0 standard drinks    Types: 1 Glasses of wine per week    Comment: Occassionaly  . Drug use: No    Review of Systems  Constitutional: No fever/chills Eyes: No visual changes. ENT: No sore throat. Cardiovascular: Denies chest pain. Respiratory: Denies shortness of breath. Gastrointestinal: No abdominal pain.  No nausea, no vomiting.  No diarrhea.  No constipation. Genitourinary: Negative for dysuria. Musculoskeletal: Negative for back pain. Skin: Negative for rash. Neurological: Negative for headaches, focal weakness or numbness.   ____________________________________________   PHYSICAL EXAM:  VITAL SIGNS: ED Triage Vitals [01/14/18 1721]  Enc Vitals Group     BP (!) 211/102     Pulse Rate 92     Resp 20     Temp 98.2 F (36.8 C)     Temp Source Oral     SpO2 93 %     Weight 130 lb 14.4 oz (59.4 kg)     Height 5\' 4"  (1.626 m)     Head Circumference      Peak Flow      Pain Score 6     Pain Loc      Pain Edu?      Excl. in [place]?     Constitutional: Alert and oriented. Well appearing and in no acute distress. Eyes: Conjunctivae are normal.  Head: Atraumatic. Nose: No congestion/rhinnorhea. Mouth/Throat: Mucous membranes are moist.  Neck: No stridor.   Cardiovascular: Normal rate, regular rhythm. Grossly normal heart sounds.   Respiratory: Normal respiratory effort.  No retractions. Lungs CTAB. Gastrointestinal: Soft and nontender. No distention.  Musculoskeletal: No lower extremity tenderness nor edema.  No joint  effusions. Neurologic:  Normal speech and language. No gross focal neurologic deficits are appreciated. Skin:  Skin is warm, dry and intact. No rash noted. Psychiatric: Mood and affect are normal. Speech and behavior are normal.  ____________________________________________   LABS (all labs ordered are listed, but only abnormal results are displayed)  Labs Reviewed  COMPREHENSIVE METABOLIC PANEL - Abnormal; Notable for the following components:      Result Value   Potassium 3.2 (*)    Glucose, Bld 102 (*)    Creatinine, Ser 1.61 (*)    AST 43 (*)    Alkaline Phosphatase 205 (*)    Total  Bilirubin 2.1 (*)    GFR calc non Af Amer 30 (*)    GFR calc Af Amer 34 (*)    All other components within normal limits  CBC WITH DIFFERENTIAL/PLATELET - Abnormal; Notable for the following components:   RBC 3.72 (*)    Hemoglobin 11.7 (*)    HCT 34.1 (*)    RDW 20.2 (*)    Platelets 57 (*)    Neutro Abs 0.6 (*)    All other components within normal limits  URINALYSIS, COMPLETE (UACMP) WITH MICROSCOPIC - Abnormal; Notable for the following components:   Color, Urine YELLOW (*)    APPearance HAZY (*)    Hgb urine dipstick SMALL (*)    Ketones, ur 5 (*)    Protein, ur 30 (*)    All other components within normal limits  TROPONIN I - Abnormal; Notable for the following components:   Troponin I 0.06 (*)    All other components within normal limits  CULTURE, BLOOD (ROUTINE X 2)  CULTURE, BLOOD (ROUTINE X 2)  URINE CULTURE  LACTIC ACID, PLASMA  PROTIME-INR  LACTIC ACID, PLASMA   ____________________________________________  EKG  ED ECG REPORT I, Doran Stabler, the attending physician, personally viewed and interpreted this ECG.   Date: 01/14/2018  EKG Time: 1723  Rate: 88  Rhythm: normal sinus rhythm with PVC  Axis: Normal axis  Intervals:none  ST&T Change: No ST segment elevation or depression.  No abnormal T wave  inversion.  ____________________________________________  RADIOLOGY   ____________________________________________   PROCEDURES  Procedure(s) performed:   Procedures  Critical Care performed:   ____________________________________________   INITIAL IMPRESSION / ASSESSMENT AND PLAN / ED COURSE  Pertinent labs & imaging results that were available during my care of the patient were reviewed by me and considered in my medical decision making (see chart for details).  Differential diagnosis includes, but is not limited to, alcohol, illicit or prescription medications, or other toxic ingestion; intracranial pathology such as stroke or intracerebral hemorrhage; fever or infectious causes including sepsis; hypoxemia and/or hypercarbia; uremia; trauma; endocrine related disorders such as diabetes, hypoglycemia, and thyroid-related diseases; hypertensive encephalopathy; etc.  As part of my medical decision making, I reviewed the following data within the [facility] chart reviewed and Notes from prior ED visits  ----------------------------------------- 7:43 PM on 01/14/2018 -----------------------------------------  Family now at the bedside reporting the patient has been confused since Wednesday.  They say that she is mistaken family members for different family members.  Face hallucinations with the family saying that she is seeing things on the ceiling.  Patient also with decreased p.o. intake as well as fall this morning onto her buttock and complaining of back pain thereafter.  I examined the patient's back at this time and she has no tenderness or deformity of the bilateral hips nor the lower lumbar region.  No deformity or step-off.  Family also saying that there is been more steady decline over months.  However, this is been a more acute decline since this past Wednesday.  Possible dementia but unclear if there is another process ongoing.  Patient also appears to  have worsening weakness and failure to thrive.  Pending lumbar as well as pelvic x-ray.  Fluids ordered.  Signed out to Dr. Margaretmary Eddy.  ____________________________________________   FINAL CLINICAL IMPRESSION(S) / ED DIAGNOSES  Mental status.  Failure to thrive.  NEW MEDICATIONS STARTED DURING THIS VISIT:  New Prescriptions   No medications on file  Note:  This document was prepared using Dragon voice recognition software and may include unintentional dictation errors.     Orbie Pyo, MD 01/14/18 1946

## 2018-01-14 NOTE — H&P (Signed)
[facility] [place] NAME: Marie Lowe    MR#:  [id number]  DATE OF BIRTH:  Dec 12, 1940  DATE OF ADMISSION:  01/14/2018  PRIMARY CARE PHYSICIAN: Marinda Elk, MD   REQUESTING/REFERRING PHYSICIAN: Orbie Pyo, MD  CHIEF COMPLAINT:   AMS HISTORY OF PRESENT ILLNESS:  Marie Lowe  is a 77 y.o. female with a known history of COPD, Hodgkin's lymphoma, lung cancer, seeing Dr. Grayland Ormond as an outpatient, hypertension, chronic kidney disease, GERD and other medical problems is brought into the ED as patient has been altered in her mental status is getting worse.  Patient was just recently admitted to the hospital with hyperkalemia and a community-acquired pneumonia and discharged home with antibiotics but patient seems to be more confused than her baseline and patient is brought into the emergency department.  CT head is negative , chest x-ray reveals Similar patchy RIGHT mid and lower lung zone airspace opacities.  Blood pressure is significantly elevated and patient seems to be dehydrated.  Marie Lowe, daughter and daughter-in-law at bedside.  Patient is altered and not answering questions appropriately.  PAST MEDICAL HISTORY:   Past Medical History:  Diagnosis Date  . Anemia   . Chest pain, unspecified   . Chronic kidney disease   . COPD (chronic obstructive pulmonary disease) ([place])   . Epistaxis   . Erosive gastropathy   . GERD (gastroesophageal reflux disease)   . High cholesterol   . Hodgkin's lymphoma ([place]) 2014   chemo  . Hodgkin's lymphoma ([place])   . Hypercalcemia   . Hypertension   . Lung cancer ([place]) 2015  . Pneumonia   . 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 Shortness of breath dyspnea     PAST SURGICAL HISTOIRY:   Past Surgical History:  Procedure Laterality Date  . ABDOMINAL HYSTERECTOMY    . APPENDECTOMY    . COLONOSCOPY WITH PROPOFOL N/A 10/01/2015   Procedure: COLONOSCOPY WITH PROPOFOL;   Surgeon: Manya Silvas, MD;  Location: [facility] ENDOSCOPY;  Service: Endoscopy;  Laterality: N/A;  . NASAL SINUS SURGERY    . PORTA CATH REMOVAL N/A 07/21/2016   Procedure: Glori Luis Cath Removal;  Surgeon: Algernon Huxley, MD;  Location: [facility] CV LAB;  Service: Cardiovascular;  Laterality: N/A;  . right lower lobe wedge resection      SOCIAL HISTORY:   Social History   Tobacco Use  . Smoking status: Former Smoker    Packs/day: 1.00    Years: 18.00    Pack years: 18.00    Types: Cigarettes    Last attempt to quit: 2013    Years since quitting: 6.6  . Smokeless tobacco: Never Used  Substance Use Topics  . Alcohol use: Yes    Alcohol/week: 1.0 standard drinks    Types: 1 Glasses of wine per week    Comment: Occassionaly    FAMILY HISTORY:   Family History  Problem Relation Age of Onset  . Diabetes Other   . CAD Other   . Cancer Other        colon cancer, breast cancer  . Kidney disease Other   . Breast cancer Maternal Aunt   . Prostate cancer Father   . Gastric cancer Mother   . Kidney cancer Neg Hx     DRUG ALLERGIES:   Allergies  Allergen Reactions  . Hydrochlorothiazide Other (See Comments)    Elevation of creatinine Patient doesn't remember what reaction she had to it.  REVIEW OF SYSTEMS:  ROS unobtainable as the patient is lethargic  MEDICATIONS AT HOME:   Prior to Admission medications unobtainable as the patient is lethargic  Medication Sig Start Date End Date Taking? Authorizing Provider  albuterol (PROVENTIL HFA;VENTOLIN HFA) 108 (90 BASE) MCG/ACT inhaler Inhale 2 puffs into the lungs every 6 (six) hours as needed for wheezing or shortness of breath.   Yes [provider]  antiseptic oral rinse (BIOTENE) LIQD 15 mLs by Mouth Rinse route as needed for dry mouth.   Yes [provider]  atorvastatin (LIPITOR) 40 MG tablet Take 1 tablet (40 mg total) by mouth daily. 01/08/18  Yes Demetrios Loll, MD  benazepril (LOTENSIN) 10 MG tablet Take  10 mg by mouth daily. 12/19/17  Yes [provider]  calcium carbonate (OS-CAL - DOSED IN MG OF ELEMENTAL CALCIUM) 1250 (500 Ca) MG tablet Take 1 tablet by mouth 2 (two) times daily with a meal.    Yes [provider]  carvedilol (COREG) 6.25 MG tablet Take 6.25 mg by mouth 2 (two) times daily. 11/02/14  Yes [provider]  mirtazapine (REMERON) 15 MG tablet Take 15 mg by mouth at bedtime.  01/04/17  Yes [provider]  Multiple Vitamins-Minerals (MULTIVITAMIN PO) Take 1 tablet by mouth daily.    Yes [provider]  triamcinolone cream (KENALOG) 0.1 % Apply 1 application topically 2 (two) times daily. 12/18/17  Yes [provider]  vitamin B-12 (CYANOCOBALAMIN) 1000 MCG tablet Take 1,000 mcg by mouth daily.   Yes [provider]  azithromycin (ZITHROMAX) 500 MG tablet Take 1 tablet (500 mg total) by mouth daily. Patient not taking: Reported on 01/14/2018 01/08/18   Demetrios Loll, MD  cefdinir (OMNICEF) 300 MG capsule Take 1 capsule (300 mg total) by mouth every 12 (twelve) hours. Patient not taking: Reported on 01/14/2018 01/08/18   Demetrios Loll, MD      VITAL SIGNS:  Blood pressure (!) 202/95, pulse 88, temperature 98.2 F (36.8 C), temperature source Oral, resp. rate (!) 29, height 5\' 4"  (1.626 m), weight 59.4 kg, SpO2 95 %.  PHYSICAL EXAMINATION:  GENERAL:  77 y.o.-year-old patient lying in the bed with no acute distress.  EYES: Pupils equal, round, reactive to light and accommodation. No scleral icterus. Extraocular muscles intact.  HEENT: Head atraumatic, normocephalic. Oropharynx and nasopharynx clear.  NECK:  Supple, no jugular venous distention. No thyroid enlargement, no tenderness.  LUNGS: Moderate breath sounds bilaterally, no wheezing, rales,rhonchi or crepitation.   CARDIOVASCULAR: S1, S2 normal. No murmurs, rubs, or gallops.  ABDOMEN: Soft, nontender, nondistended. Bowel sounds present. No organomegaly or mass.  EXTREMITIES:  No pedal edema, cyanosis, or clubbing.  NEUROLOGIC: Cranial nerves II through XII are intact. Muscle strength 5/5 in all extremities. Sensation intact. Gait not checked.  PSYCHIATRIC: The patient is alert and oriented x 3.  SKIN: No obvious rash, lesion, or ulcer.   LABORATORY PANEL:   CBC Recent Labs  Lab 01/14/18 1723  WBC 3.8  HGB 11.7*  HCT 34.1*  PLT 57*   ------------------------------------------------------------------------------------------------------------------  Chemistries  Recent Labs  Lab 01/14/18 1723  NA 140  K 3.2*  CL 105  CO2 24  GLUCOSE 102*  BUN 16  CREATININE 1.61*  CALCIUM 10.0  AST 43*  ALT 25  ALKPHOS 205*  BILITOT 2.1*   ------------------------------------------------------------------------------------------------------------------  Cardiac Enzymes Recent Labs  Lab 01/14/18 1723  TROPONINI 0.06*   ------------------------------------------------------------------------------------------------------------------  RADIOLOGY:  Dg Chest 2 View  Result Date: 01/14/2018 CLINICAL  DATA:  Weakness. Abdominal pain. History of COPD, lung cancer, non-Hodgkin's lymphoma. EXAM: CHEST - 2 VIEW COMPARISON:  CT chest January 05, 2018 and chest radiograph January 03, 2018. FINDINGS: Stable cardiomegaly. Mediastinal silhouette is not suspicious. Calcified aortic arch. LEFT mid lung zone bandlike density. Chronic interstitial changes without pleural effusion. Patchy RIGHT mid and lower lung zone airspace opacities. Biapical bullous changes. No pneumothorax. Mild degenerative changes thoracic spine. Osteopenia. IMPRESSION: 1. Stable cardiomegaly. 2. COPD with LEFT mid lung zone scarring. Similar patchy RIGHT mid and lower lung zone airspace opacities. Aortic Atherosclerosis (ICD10-I70.0). Electronically Signed   By: Elon Alas M.D.   On: 01/14/2018 18:57   Dg Lumbar Spine Complete  Result Date: 01/14/2018 CLINICAL DATA:  Back pain after fall EXAM: LUMBAR  SPINE - COMPLETE 4+ VIEW COMPARISON:  CT 10/07/2015 FINDINGS: Lumbar alignment within normal limits. Moderate degenerative change at L5-S1. Mild degenerative changes elsewhere in the spine. Vertebral body heights are grossly maintained. Heavily calcified aorta. IMPRESSION: Mild to moderate multilevel degenerative changes. No definite acute osseous abnormality. Electronically Signed   By: Donavan Foil M.D.   On: 01/14/2018 20:40   Dg Pelvis 1-2 Views  Result Date: 01/14/2018 CLINICAL DATA:  Low back pain after fall EXAM: PELVIS - 1-2 VIEW COMPARISON:  CT 10/07/2015 FINDINGS: SI joint are non widened. Pubic symphysis and rami are intact. Both femoral heads project in joint. Possible step-off deformity at the right subcapital femur. IMPRESSION: Possible subcapital step-off deformity involving proximal right femur. Suggest CT for further evaluation. Electronically Signed   By: Donavan Foil M.D.   On: 01/14/2018 20:37   Ct Head Wo Contrast  Result Date: 01/14/2018 CLINICAL DATA:  Fall with unknown LOC EXAM: CT HEAD WITHOUT CONTRAST TECHNIQUE: Contiguous axial images were obtained from the base of the skull through the vertex without intravenous contrast. COMPARISON:  MRI 01/06/2018, CT brain 01/05/2018 FINDINGS: Brain: No acute territorial infarction, hemorrhage or intracranial mass. The ventricles are of normal size. Mild atrophy. Minimal small vessel ischemic changes of the white matter. Vascular: No hyperdense vessels.  Carotid vascular calcification. Skull: Normal. Negative for fracture or focal lesion. Sinuses/Orbits: No acute finding. Other: None IMPRESSION: No CT evidence for acute intracranial abnormality. Atrophy and minimal small vessel ischemic change of the white matter Electronically Signed   By: Donavan Foil M.D.   On: 01/14/2018 18:34    EKG:   Orders placed or performed during the hospital encounter of 01/14/18  . ED EKG  . ED EKG  . EKG 12-Lead  . EKG 12-Lead    IMPRESSION AND  PLAN:   Marie Lowe  is a 77 y.o. female with a known history of COPD, Hodgkin's lymphoma, lung cancer, seeing Dr. Grayland Ormond as an outpatient, hypertension, chronic kidney disease, GERD and other medical problems is brought into the ED as patient has been altered in her mental status is getting worse.  Patient was just recently admitted to the hospital with hyperkalemia and a community-acquired pneumonia and discharged home with antibiotics but patient seems to be more confused than her baseline and patient is brought into the emergency department.  CT head is negative , chest x-ray reveals Similar patchy RIGHT mid and lower lung zone airspace opacities  #Acute encephalopathy secondary to persistent pneumonia, acute kidney injury Admit to MedSurg unit Spectrum IV antibiotics Zosyn and vancomycin as patient still has persistent pneumonia clinically getting worse Hydrate with IV fluids Sputum culture and sensitivity Symptomatic treatment  #Acute kidney injury on chronic kidney disease  IV fluids and hold nephrotoxins and monitor renal function closely  #Hypertensive urgency IV labetalol as needed Resume home medications and titrate as needed  #Hypokalemia replete and recheck in a.m.  #Chronic COPD no exacerbation-breathing treatments  #History of lung cancer and Hodgkin's lymphoma Outpatient follow-up with Dr. Grayland Ormond as recommended  #Thrombocytopenia-no active bleeding or bruising   DVT prophylaxis-SCDs as the patient has thrombocytopenia     All the records are reviewed and case discussed with ED provider. Management plans discussed with the patient, family and they are in agreement.  CODE STATUS: dnr  TOTAL TIME TAKING CARE OF THIS PATIENT: 45  minutes.   Note: This dictation was prepared with Dragon dictation along with smaller phrase technology. Any transcriptional errors that result from this process are unintentional.  Nicholes Mango M.D on 01/14/2018 at 9:47  PM  Between 7am to 6pm - Pager - [phone]  After 6pm go to [email or web address] - password [email or web address] [facility]  Office  [phone]  CC: Primary care physician; Marinda Elk, MD

## 2018-01-14 NOTE — Progress Notes (Signed)
Family Meeting Note  Advance Directive:yes  Today a meeting took place with the Patient, daughter, son and daughter-in-law at bedside  Patient is unable to participate due CW:CBJSEG capacity delerious   The following clinical team members were present during this meeting:MD  The following were discussed:Patient's diagnosis: Acute delirium, acute kidney injury, developing pneumonia, hypokalemia, hypertensive urgency, failure to thrive other comorbidities as documented below, treatment plan of care discussed in detail with the patient and family members at bedside.  They verbalized understanding of the plan.  Anemia    . Chest pain, unspecified   . Chronic kidney disease   . COPD (chronic obstructive pulmonary disease) ([place])   . Epistaxis   . Erosive gastropathy   . GERD (gastroesophageal reflux disease)   . High cholesterol   . Hodgkin's lymphoma ([place]) 2014   chemo  . Hodgkin's lymphoma ([place])   . Hypercalcemia   . Hypertension   . Lung cancer ([place]) 2015  . Pneumonia   . 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 Shortness of breath dyspnea      , Patient's progosis: Unable to determine and Goals for treatment: DNR  Daughter Danae Chen is a healthcare power of attorney  Additional follow-up to be provided: Hospitalist  Time spent during discussion:17 min  Nicholes Mango, MD

## 2018-01-14 NOTE — ED Triage Notes (Signed)
Pt arrives via ems from home with concerns over altered mentation. Pt recently treated for UTI.

## 2018-01-14 NOTE — ED Notes (Signed)
Rebecca RN, aware of bed assigned 

## 2018-01-15 ENCOUNTER — Other Ambulatory Visit: Payer: Self-pay

## 2018-01-15 LAB — COMPREHENSIVE METABOLIC PANEL
ALT: 23 U/L (ref 0–44)
ANION GAP: 8 (ref 5–15)
AST: 37 U/L (ref 15–41)
Albumin: 3.1 g/dL — ABNORMAL LOW (ref 3.5–5.0)
Alkaline Phosphatase: 179 U/L — ABNORMAL HIGH (ref 38–126)
BUN: 14 mg/dL (ref 8–23)
CHLORIDE: 107 mmol/L (ref 98–111)
CO2: 25 mmol/L (ref 22–32)
Calcium: 8.8 mg/dL — ABNORMAL LOW (ref 8.9–10.3)
Creatinine, Ser: 1.45 mg/dL — ABNORMAL HIGH (ref 0.44–1.00)
GFR calc non Af Amer: 34 mL/min — ABNORMAL LOW (ref >60)
GFR, EST AFRICAN AMERICAN: 39 mL/min — AB (ref >60)
Glucose, Bld: 92 mg/dL (ref 70–99)
Potassium: 3.2 mmol/L — ABNORMAL LOW (ref 3.5–5.1)
SODIUM: 140 mmol/L (ref 135–145)
Total Bilirubin: 2.1 mg/dL — ABNORMAL HIGH (ref 0.3–1.2)
Total Protein: 5.9 g/dL — ABNORMAL LOW (ref 6.5–8.1)

## 2018-01-15 LAB — CBC
HCT: 32.4 % — ABNORMAL LOW (ref 35.0–47.0)
HEMOGLOBIN: 11 g/dL — AB (ref 12.0–16.0)
MCH: 31.2 pg (ref 26.0–34.0)
MCHC: 33.8 g/dL (ref 32.0–36.0)
MCV: 92.1 fL (ref 80.0–100.0)
Platelets: 46 10*3/uL — ABNORMAL LOW (ref 150–440)
RBC: 3.52 MIL/uL — AB (ref 3.80–5.20)
RDW: 20.7 % — ABNORMAL HIGH (ref 11.5–14.5)
WBC: 3.1 10*3/uL — ABNORMAL LOW (ref 3.6–11.0)

## 2018-01-15 LAB — MAGNESIUM: MAGNESIUM: 1.7 mg/dL (ref 1.7–2.4)

## 2018-01-15 LAB — AMMONIA: Ammonia: 16 umol/L (ref 9–35)

## 2018-01-15 MED ORDER — VITAMIN B-12 1000 MCG PO TABS
1000.0000 ug | ORAL_TABLET | Freq: Every day | ORAL | Status: DC
  Administered 2018-01-15 – 2018-01-17 (×3): 1000 ug via ORAL
  Filled 2018-01-15 (×3): qty 1

## 2018-01-15 MED ORDER — ONDANSETRON HCL 4 MG/2ML IJ SOLN: 4.0000 mg | Freq: Four times a day (QID) | INTRAMUSCULAR | Status: DC | PRN

## 2018-01-15 MED ORDER — TRIAMCINOLONE ACETONIDE 0.1 % EX CREA
1.0000 "application " | TOPICAL_CREAM | Freq: Two times a day (BID) | CUTANEOUS | Status: DC
  Administered 2018-01-15 – 2018-01-17 (×3): 1 via TOPICAL
  Filled 2018-01-15: qty 15

## 2018-01-15 MED ORDER — CARVEDILOL 3.125 MG PO TABS
6.2500 mg | ORAL_TABLET | Freq: Two times a day (BID) | ORAL | Status: DC
  Administered 2018-01-15: 01:00:00 6.25 mg via ORAL
  Filled 2018-01-15: qty 2

## 2018-01-15 MED ORDER — DOCUSATE SODIUM 100 MG PO CAPS
100.0000 mg | ORAL_CAPSULE | Freq: Two times a day (BID) | ORAL | Status: DC
  Administered 2018-01-15 – 2018-01-17 (×5): 100 mg via ORAL
  Filled 2018-01-15 (×5): qty 1

## 2018-01-15 MED ORDER — BIOTENE DRY MOUTH MT LIQD: 15.0000 mL | OROMUCOSAL | Status: DC | PRN

## 2018-01-15 MED ORDER — BENAZEPRIL HCL 10 MG PO TABS
10.0000 mg | ORAL_TABLET | Freq: Every day | ORAL | Status: DC
  Filled 2018-01-15 (×2): qty 1

## 2018-01-15 MED ORDER — PIPERACILLIN-TAZOBACTAM 3.375 G IVPB: 3.3750 g | Freq: Three times a day (TID) | INTRAVENOUS | Status: DC

## 2018-01-15 MED ORDER — VANCOMYCIN HCL IN DEXTROSE 750-5 MG/150ML-% IV SOLN
750.0000 mg | INTRAVENOUS | Status: DC
  Filled 2018-01-15: qty 150

## 2018-01-15 MED ORDER — ALBUTEROL SULFATE (2.5 MG/3ML) 0.083% IN NEBU: 2.5000 mg | INHALATION_SOLUTION | Freq: Four times a day (QID) | RESPIRATORY_TRACT | Status: DC | PRN

## 2018-01-15 MED ORDER — MIRTAZAPINE 15 MG PO TABS
15.0000 mg | ORAL_TABLET | Freq: Every day | ORAL | Status: DC
  Administered 2018-01-15 – 2018-01-16 (×3): 15 mg via ORAL
  Filled 2018-01-15 (×4): qty 1

## 2018-01-15 MED ORDER — BENAZEPRIL HCL 20 MG PO TABS
20.0000 mg | ORAL_TABLET | Freq: Every day | ORAL | Status: DC
  Administered 2018-01-16 – 2018-01-17 (×2): 20 mg via ORAL
  Filled 2018-01-15 (×3): qty 1

## 2018-01-15 MED ORDER — POTASSIUM CHLORIDE 20 MEQ PO PACK
40.0000 meq | PACK | Freq: Once | ORAL | Status: AC
  Administered 2018-01-15: 40 meq via ORAL
  Filled 2018-01-15: qty 2

## 2018-01-15 MED ORDER — CARVEDILOL 3.125 MG PO TABS
12.5000 mg | ORAL_TABLET | Freq: Two times a day (BID) | ORAL | Status: DC
  Administered 2018-01-15 – 2018-01-17 (×4): 12.5 mg via ORAL
  Filled 2018-01-15 (×4): qty 4

## 2018-01-15 MED ORDER — ACETAMINOPHEN 650 MG RE SUPP: 650.0000 mg | Freq: Four times a day (QID) | RECTAL | Status: DC | PRN

## 2018-01-15 MED ORDER — QUETIAPINE FUMARATE 25 MG PO TABS
25.0000 mg | ORAL_TABLET | Freq: Once | ORAL | Status: AC
  Administered 2018-01-15: 25 mg via ORAL
  Filled 2018-01-15: qty 1

## 2018-01-15 MED ORDER — ACETAMINOPHEN 325 MG PO TABS
650.0000 mg | ORAL_TABLET | Freq: Four times a day (QID) | ORAL | Status: DC | PRN
  Administered 2018-01-15 – 2018-01-16 (×2): 650 mg via ORAL
  Filled 2018-01-15 (×3): qty 2

## 2018-01-15 MED ORDER — ONDANSETRON HCL 4 MG PO TABS: 4.0000 mg | ORAL_TABLET | Freq: Four times a day (QID) | ORAL | Status: DC | PRN

## 2018-01-15 MED ORDER — AMOXICILLIN-POT CLAVULANATE 500-125 MG PO TABS
1.0000 | ORAL_TABLET | Freq: Two times a day (BID) | ORAL | Status: DC
  Administered 2018-01-15 – 2018-01-17 (×4): 500 mg via ORAL
  Filled 2018-01-15 (×6): qty 1

## 2018-01-15 MED ORDER — BOOST PLUS PO LIQD: 237.0000 mL | Freq: Three times a day (TID) | ORAL | Status: DC

## 2018-01-15 NOTE — Evaluation (Signed)
Clinical/Bedside Swallow Evaluation Patient Details  Name: Marie Lowe MRN: [id number] Date of Birth: [birth year]  Today's Date: 01/15/2018 Time: SLP Start Time (ACUTE ONLY): 0945 SLP Stop Time (ACUTE ONLY): 1045 SLP Time Calculation (min) (ACUTE ONLY): 60 min  Past Medical History:  Past Medical History:  Diagnosis Date  . Anemia   . Chest pain, unspecified   . Chronic kidney disease   . COPD (chronic obstructive pulmonary disease) ([place])   . Epistaxis   . Erosive gastropathy   . GERD (gastroesophageal reflux disease)   . High cholesterol   . Hodgkin's lymphoma ([place]) 2014   chemo  . Hodgkin's lymphoma ([place])   . Hypercalcemia   . Hypertension   . Lung cancer ([place]) 2015  . Pneumonia   . 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 Shortness of breath dyspnea    Past Surgical History:  Past Surgical History:  Procedure Laterality Date  . ABDOMINAL HYSTERECTOMY    . APPENDECTOMY    . COLONOSCOPY WITH PROPOFOL N/A 10/01/2015   Procedure: COLONOSCOPY WITH PROPOFOL;  Surgeon: Manya Silvas, MD;  Location: [facility];  Service: Endoscopy;  Laterality: N/A;  . NASAL SINUS SURGERY    . PORTA CATH REMOVAL N/A 07/21/2016   Procedure: Glori Luis Cath Removal;  Surgeon: Algernon Huxley, MD;  Location: [place] [facility];  Service: Cardiovascular;  Laterality: N/A;  . right lower lobe wedge resection     HPI:  Pt is a 77 y.o. female with a known history of CKD, COPD, Hodgkin's lymphoma, lung cancer, seeing Dr. Grayland Ormond as an outpatient, hypertension, chronic kidney disease, GERD, erosive gastropathy, pneumonia and other medical problems is brought into the ED as patient has been altered in her mental status is getting worse.  Patient was just recently admitted to the hospital with hyperkalemia and a community-acquired pneumonia and discharged home with antibiotics - it was assessed that pt had oropharyngeal phase dysphagia and diet was to be on a dysphagia diet w/ Nectar consistency liquids.  Patient seems to be more confused than her baseline and patient is brought into the emergency department by family.  CT head is negative , chest x-ray reveals Similar patchy RIGHT mid and lower lung zone airspace opacities.  Blood pressure is significantly elevated and patient seems to be dehydrated per MD assessment at admission.   Assessment / Plan / Recommendation Clinical Impression  Patient appears to present with min-mod oropharyngeal phase dyspahgia c/b overt s/s of pharyngeal dysphagia at bedside w/ trials of thin liquids given. Pt consumed trials of thin liquids VIA CUP w/ both delayed and immediate coughing following; pt instructed to take single sips during trials and was careful holding cup to take small sips. W/ trials of Nectar consistency liquids VIA CUP, no overt s/s of aspiration were noted, vocal quality cleear b/t trials and no apparent decline in respiratory status noted. Similar was noted w/ few trials of puree. Oral phase appeared grossly [facility] w/ bolus trials given; no solids were assessed this exam but pt demonstrated appropriate management of mech soft trials at recent assessment on 01/06/18 per notes - will f/u w/ ongoing assessment of this at a meal. OM exam was largely [facility] w/ no unilateral weakness noted. Pt helped to feed self by holding cup to drink but suspect will need support during meals d/t overall weakness and declined Cognitive status. Recommend Dysphagia III diet with Nectar thick liquids VIA CUP with aspiration precautions, with Pills to be given Whole in Puree as tolerates. Recommend  assistance at meals. SLP to f/u with toleration of diet and provide trials to upgrade thin liquids if appropriate, safe for pt. SLP Visit Diagnosis: Dysphagia, oropharyngeal phase (R13.12)    Aspiration Risk  Mild-Moderate aspiration risk;Risk for inadequate nutrition/hydration    Diet Recommendation  Dysphagia level 3(mech soft foods) w/ NECTAR consistency liquids VIA CUP. Aspiration  precautions; Monitoring and assistance at meals d/t Cognitive decline, illness.  Medication Administration: Whole meds with puree(as able or Crushed if indicated for safer swallowing)    Other  Recommendations Recommended Consults: (Dietician f/u) Oral Care Recommendations: Oral care BID;Staff/trained caregiver to provide oral care Other Recommendations: Order thickener from pharmacy;Prohibited food (jello, ice cream, thin soups);Remove water pitcher;Have oral suction available   Follow up Recommendations Skilled Nursing facility      Frequency and Duration min 2x/week  1 week       Prognosis Prognosis for Safe Diet Advancement: Fair Barriers to Reach Goals: Cognitive deficits;Severity of deficits      Swallow Study   General Date of Onset: 01/14/18 HPI: Pt is a 77 y.o. female with a known history of CKD, COPD, Hodgkin's lymphoma, lung cancer, seeing Dr. Grayland Ormond as an outpatient, hypertension, chronic kidney disease, GERD, erosive gastropathy, pneumonia and other medical problems is brought into the ED as patient has been altered in her mental status is getting worse.  Patient was just recently admitted to the hospital with hyperkalemia and a community-acquired pneumonia and discharged home with antibiotics - it was assessed that pt had oropharyngeal phase dysphagia and diet was to be on a dysphagia diet w/ Nectar consistency liquids. Patient seems to be more confused than her baseline and patient is brought into the emergency department by family.  CT head is negative , chest x-ray reveals Similar patchy RIGHT mid and lower lung zone airspace opacities.  Blood pressure is significantly elevated and patient seems to be dehydrated per MD assessment at admission. Type of Study: Bedside Swallow Evaluation Previous Swallow Assessment: 01/06/18 Diet Prior to this Study: Dysphagia 3 (soft);Nectar-thick liquids Temperature Spikes Noted: No(wbc 3.1) Respiratory Status: Nasal cannula(2  liters) History of Recent Intubation: No Behavior/Cognition: Alert;Cooperative;Pleasant mood;Confused;Distractible;Requires cueing Oral Cavity Assessment: Within Functional Limits Oral Care Completed by SLP: Recent completion by staff Oral Cavity - Dentition: Dentures, top;Dentures, bottom(partials) Vision: Functional for self-feeding Self-Feeding Abilities: Able to feed self;Needs assist;Needs set up;Total assist(needs positioning) Patient Positioning: Upright in bed Baseline Vocal Quality: Normal Volitional Cough: (adequate) Volitional Swallow: Able to elicit    Oral/Motor/Sensory Function Overall Oral Motor/Sensory Function: (grossly WFL for OM movements during trials; oral clearing) Facial Symmetry: Within Functional Limits Lingual ROM: Within Functional Limits Lingual Symmetry: Within Functional Limits Lingual Strength: Within Functional Limits Mandible: Within Functional Limits   Ice Chips Ice chips: Not tested   Thin Liquid Thin Liquid: Impaired Presentation: Cup;Self Fed(supported when needed; 4 trials) Oral Phase Impairments: (adequate) Oral Phase Functional Implications: (none) Pharyngeal  Phase Impairments: Cough - Immediate;Cough - Delayed(x1 each)    Nectar Thick Nectar Thick Liquid: Within functional limits Presentation: Cup;Self Fed Other Comments: pt consumed 4-5 trial sips of Nectar liquids w/ no overt s/s of aspiration noted   Honey Thick Honey Thick Liquid: Not tested   Puree Puree: Within functional limits Presentation: Self Fed;Spoon(3 trials)   Solid     Solid: Not tested Other Comments: will f/u at a meal; had tolerated the mech soft consistency well at recent BSE on 01/06/18      Orinda Kenner, MS, CCC-SLP [facility] Markman 01/15/2018,1:01 PM

## 2018-01-15 NOTE — Progress Notes (Deleted)
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  INTERVAL HISTORY: Patient returns to clinic today for further evaluation and discussion of her imaging results.  She continues to feel well and remains asymptomatic.  She recently had a bout of pneumonia, but this has since resolved.  She has a good appetite and her weight is stable. She denies any fevers or night sweats. She has no neurologic complaints.  She denies any nausea, vomiting, or diarrhea. She has no chest pain, cough, hemoptysis, or shortness of breath. She has no urinary complaints.  Patient feels at her baseline offers no specific complaints today.  REVIEW OF SYSTEMS:   Review of Systems  Constitutional: Negative.  Negative for fever and malaise/fatigue.  Respiratory: Negative.  Negative for cough, hemoptysis and shortness of breath.   Cardiovascular: Negative.  Negative for chest pain and leg swelling.  Gastrointestinal: Negative.  Negative for abdominal pain and constipation.  Genitourinary: Negative.  Negative for dysuria.  Musculoskeletal: Negative.   Skin: Negative.  Negative for rash.  Neurological: Negative.  Negative for sensory change, focal weakness and weakness.  Endo/Heme/Allergies: Negative.   Psychiatric/Behavioral: Negative.  Negative for memory loss. The patient is not nervous/anxious.     As per HPI. Otherwise, a complete review of systems is negative.  PAST MEDICAL HISTORY: Past Medical History:  Diagnosis Date  . Anemia   . Chest pain, unspecified   . Chronic kidney disease   . COPD (chronic obstructive pulmonary disease) ([place])   . Epistaxis   . Erosive gastropathy   . GERD  (gastroesophageal reflux disease)   . High cholesterol   . Hodgkin's lymphoma ([place]) 2014   chemo  . Hodgkin's lymphoma ([place])   . Hypercalcemia   . Hypertension   . Lung cancer ([place]) 2015  . Pneumonia   . 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 Shortness of breath dyspnea     PAST SURGICAL HISTORY: Past Surgical History:  Procedure Laterality Date  . ABDOMINAL HYSTERECTOMY    . APPENDECTOMY    . COLONOSCOPY WITH PROPOFOL N/A 10/01/2015   Procedure: COLONOSCOPY WITH PROPOFOL;  Surgeon: Manya Silvas, MD;  Location: [facility] ENDOSCOPY;  Service: Endoscopy;  Laterality: N/A;  . NASAL SINUS SURGERY    . PORTA CATH REMOVAL N/A 07/21/2016   Procedure: Glori Luis Cath Removal;  Surgeon: Algernon Huxley, MD;  Location: [place] [facility] LAB;  Service: Cardiovascular;  Laterality: N/A;  . right lower lobe wedge resection      FAMILY HISTORY Family History  Problem Relation Age of Onset  . Diabetes Other   . CAD Other   . Cancer Other        colon cancer, breast cancer  . Kidney disease Other   . Breast cancer Maternal Aunt   . Prostate cancer Father   . Gastric cancer Mother   . Kidney cancer Neg Hx        ADVANCED DIRECTIVES:    HEALTH MAINTENANCE: Social History   Tobacco Use  . Smoking status: Former Smoker    Packs/day: 1.00    Years: 18.00    Pack years: 18.00    Types: Cigarettes    Last attempt to quit: 2013    Years since quitting: 6.6  .  Smokeless tobacco: Never Used  Substance Use Topics  . Alcohol use: Not Currently    Alcohol/week: 1.0 standard drinks    Types: 1 Glasses of wine per week    Comment: Occassionaly  . Drug use: No     Colonoscopy:  PAP:  Bone density:  Lipid panel:  Allergies  Allergen Reactions  . Hydrochlorothiazide Other (See Comments)    Elevation of creatinine Patient doesn't remember what reaction she had to it.    No current facility-administered medications for this visit.    No current outpatient medications on file.    Facility-Administered Medications Ordered in Other Visits  Medication Dose Route Frequency Provider Last Rate Last Dose  . 0.9 % NaCl with KCl 20 mEq/ L  infusion   Intravenous Continuous Salary, Montell D, MD 50 mL/hr at 01/15/18 1352    . acetaminophen (TYLENOL) tablet 650 mg  650 mg Oral Q6H PRN Gouru, Aruna, MD   650 mg at 01/15/18 2005   Or  . acetaminophen (TYLENOL) suppository 650 mg  650 mg Rectal Q6H PRN Gouru, Aruna, MD      . albuterol (PROVENTIL) (2.5 MG/3ML) 0.083% nebulizer solution 2.5 mg  2.5 mg Inhalation Q6H PRN Gouru, Aruna, MD      . amoxicillin-clavulanate (AUGMENTIN) 500-125 MG per tablet 500 mg  1 tablet Oral BID Salary, Montell D, MD   500 mg at 01/15/18 2006  . antiseptic oral rinse (BIOTENE) solution 15 mL  15 mL Mouth Rinse PRN Gouru, Aruna, MD      . benazepril (LOTENSIN) tablet 20 mg  20 mg Oral Daily Salary, Montell D, MD      . carvedilol (COREG) tablet 12.5 mg  12.5 mg Oral BID WC Salary, Montell D, MD   12.5 mg at 01/15/18 1740  . docusate sodium (COLACE) capsule 100 mg  100 mg Oral BID Gouru, Aruna, MD   100 mg at 01/15/18 2005  . heparin lock flush 100 unit/mL  500 Units Intravenous Once Lloyd Huger, MD      . labetalol (NORMODYNE,TRANDATE) injection 10 mg  10 mg Intravenous Q2H PRN Nicholes Mango, MD   10 mg at 01/15/18 2027  . mirtazapine (REMERON) tablet 15 mg  15 mg Oral QHS Gouru, Illene Silver, MD   15 mg at 01/15/18 2004  . ondansetron (ZOFRAN) tablet 4 mg  4 mg Oral Q6H PRN Gouru, Aruna, MD       Or  . ondansetron (ZOFRAN) injection 4 mg  4 mg Intravenous Q6H PRN Gouru, Aruna, MD      . sodium chloride 0.9 % injection 10 mL  10 mL Intravenous PRN Lloyd Huger, MD   10 mL at 02/09/15 0844  . triamcinolone cream (KENALOG) 0.1 % 1 application  1 application Topical BID Nicholes Mango, MD   1 application at 89/38/10 2008  . vitamin B-12 (CYANOCOBALAMIN) tablet 1,000 mcg  1,000 mcg Oral Daily Gouru, Aruna, MD   1,000 mcg at 01/15/18 1352     OBJECTIVE: There were no vitals filed for this visit.   There is no height or weight on file to calculate BMI.    ECOG FS:0 - Asymptomatic  General: Well-developed, well-nourished, no acute distress. Eyes: Pink conjunctiva, anicteric sclera. HEENT: Normocephalic, moist mucous membranes, clear oropharnyx.  No palpable lymphadenopathy. Lungs: Clear to auscultation bilaterally. Heart: Regular rate and rhythm. No rubs, murmurs, or gallops. Abdomen: Soft, nontender, nondistended. No organomegaly noted, normoactive bowel sounds. Musculoskeletal: No edema, cyanosis, or clubbing. Neuro: Alert, answering  all questions appropriately. Cranial nerves grossly intact. Skin: No rashes or petechiae noted. Psych: Normal affect.  LAB RESULTS:  Lab Results  Component Value Date   NA 140 01/15/2018   K 3.2 (L) 01/15/2018   CL 107 01/15/2018   CO2 25 01/15/2018   GLUCOSE 92 01/15/2018   BUN 14 01/15/2018   CREATININE 1.45 (H) 01/15/2018   CALCIUM 8.8 (L) 01/15/2018   PROT 5.9 (L) 01/15/2018   ALBUMIN 3.1 (L) 01/15/2018   AST 37 01/15/2018   ALT 23 01/15/2018   ALKPHOS 179 (H) 01/15/2018   BILITOT 2.1 (H) 01/15/2018   GFRNONAA 34 (L) 01/15/2018   GFRAA 39 (L) 01/15/2018    Lab Results  Component Value Date   WBC 3.1 (L) 01/15/2018   NEUTROABS 0.6 (L) 01/14/2018   HGB 11.0 (L) 01/15/2018   HCT 32.4 (L) 01/15/2018   MCV 92.1 01/15/2018   PLT 46 (L) 01/15/2018     STUDIES: Dg Chest 2 View  Result Date: 01/14/2018 CLINICAL DATA:  Weakness. Abdominal pain. History of COPD, lung cancer, non-Hodgkin's lymphoma. EXAM: CHEST - 2 VIEW COMPARISON:  CT chest January 05, 2018 and chest radiograph January 03, 2018. FINDINGS: Stable cardiomegaly. Mediastinal silhouette is not suspicious. Calcified aortic arch. LEFT mid lung zone bandlike density. Chronic interstitial changes without pleural effusion. Patchy RIGHT mid and lower lung zone airspace opacities. Biapical bullous changes. No pneumothorax.  Mild degenerative changes thoracic spine. Osteopenia. IMPRESSION: 1. Stable cardiomegaly. 2. COPD with LEFT mid lung zone scarring. Similar patchy RIGHT mid and lower lung zone airspace opacities. Aortic Atherosclerosis (ICD10-I70.0). Electronically Signed   By: Elon Alas M.D.   On: 01/14/2018 18:57   Dg Chest 2 View  Result Date: 01/03/2018 CLINICAL DATA:  77 year old female with intermittent dizziness and lightheadedness since Thursday. 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 EXAM: CHEST - 2 VIEW COMPARISON:  Most recent prior imaging is a PET-CT from 09/15/2017 FINDINGS: Mild cardiomegaly. Atherosclerotic calcifications are noted in the transverse aorta. The mediastinal contours are within normal limits. Biapical pleuroparenchymal scarring. Overall hyperinflation with hyperlucency in the upper lungs consistent with underlying emphysema. Areas of linear airspace opacity are present in the lingula and right lower lobe. Comparing across modalities to the prior PET-CT demonstrates a similar distribution of pleuroparenchymal scarring. No definite new airspace consolidation, pleural effusion or pneumothorax. No acute osseous abnormality. IMPRESSION: Comparing across modalities to the prior PET-CT dated 09/15/2017, similar appearance of the lungs with a combination of patchy and linear airspace opacities in the lingula, and in the upper and lower aspects of the right lower lobe. Findings may reflect chronic pleuroparenchymal scarring and/or chronic/indolent infection. Stable cardiomegaly. Aortic Atherosclerosis (ICD10-I70.0) and Emphysema (ICD10-J43.9). Electronically Signed   By: Jacqulynn Cadet M.D.   On: 01/03/2018 14:17   Dg Lumbar Spine Complete  Result Date: 01/14/2018 CLINICAL DATA:  Back pain after fall EXAM: LUMBAR SPINE - COMPLETE 4+ VIEW COMPARISON:  CT 10/07/2015 FINDINGS: Lumbar alignment within normal limits.  Moderate degenerative change at L5-S1. Mild degenerative changes elsewhere in the spine. Vertebral body heights are grossly maintained. Heavily calcified aorta. IMPRESSION: Mild to moderate multilevel degenerative changes. No definite acute osseous abnormality. Electronically Signed   By: Donavan Foil M.D.   On: 01/14/2018 20:40   Dg Pelvis 1-2 Views  Result Date: 01/14/2018 CLINICAL DATA:  Low back pain after fall EXAM: PELVIS - 1-2 VIEW COMPARISON:  CT 10/07/2015 FINDINGS: SI joint are non  widened. Pubic symphysis and rami are intact. Both femoral heads project in joint. Possible step-off deformity at the right subcapital femur. IMPRESSION: Possible subcapital step-off deformity involving proximal right femur. Suggest CT for further evaluation. Electronically Signed   By: Donavan Foil M.D.   On: 01/14/2018 20:37   Ct Head Wo Contrast  Result Date: 01/14/2018 CLINICAL DATA:  Fall with unknown LOC EXAM: CT HEAD WITHOUT CONTRAST TECHNIQUE: Contiguous axial images were obtained from the base of the skull through the vertex without intravenous contrast. COMPARISON:  MRI 01/06/2018, CT brain 01/05/2018 FINDINGS: Brain: No acute territorial infarction, hemorrhage or intracranial mass. The ventricles are of normal size. Mild atrophy. Minimal small vessel ischemic changes of the white matter. Vascular: No hyperdense vessels.  Carotid vascular calcification. Skull: Normal. Negative for fracture or focal lesion. Sinuses/Orbits: No acute finding. Other: None IMPRESSION: No CT evidence for acute intracranial abnormality. Atrophy and minimal small vessel ischemic change of the white matter Electronically Signed   By: Donavan Foil M.D.   On: 01/14/2018 18:34   Ct Chest W Contrast  Result Date: 01/05/2018 CLINICAL DATA:  Hypercalcemia, history of lung cancer, Hodgkin's lymphoma, COPD, hypertension, former smoker EXAM: CT CHEST WITH CONTRAST TECHNIQUE: Multidetector CT imaging of the chest was performed during  intravenous contrast administration. Sagittal and coronal MPR images reconstructed from axial data set. CONTRAST:  80mL OMNIPAQUE IOHEXOL 300 MG/ML SOLN IV. No oral contrast. COMPARISON:  PET/CT 09/15/2017 FINDINGS: Cardiovascular: Atherosclerotic calcifications aorta, coronary arteries and proximal great vessels. Aorta normal caliber. Pulmonary arteries adequately opacified without gross filling defect by non dedicated exam. Minimal pericardial effusion. Mediastinum/Nodes: No esophageal abnormalities. Base of cervical region normal appearance. No thoracic adenopathy. Lungs/Pleura: Severe emphysematous changes. Central peribronchial thickening. Infiltrate identified in RIGHT lower lobe and posterior aspect of RIGHT upper lobe new since previous exam associated with thickening of the major and minor fissures posteriorly in mid RIGHT chest. These findings could represent pneumonia but cannot exclude underlying tumor within these areas of consolidation in the posterior RIGHT lung. Subsegmental atelectasis lingula. Few calcified granulomata within RIGHT lower lobe. Small RIGHT pleural effusion. No pneumothorax. Minimal dependent atelectasis or infiltrate in LEFT lower lobe, potentially obscuring the previously seen LEFT lower lobe pulmonary nodule versus interval resolution of nodule. No additional mass/nodule. Paramediastinal fibrosis medial LEFT upper lobe. Upper Abdomen: Splenic enlargement. Triangular defect and splenic enhancement at medial spleen, could be an artifact related to early phase of imaging but a splenic infarct is not excluded, area in question 3.3 x 2.0 cm. Cortical scarring and cysts within the upper kidneys bilaterally. Remaining visualized upper abdomen unremarkable. Musculoskeletal: Demineralized without focal lesion. IMPRESSION: COPD changes with new infiltrates identified in the posterior mid light lung involving the RIGHT lower lobe and posterior RIGHT upper lobe, associated with pleural  thickening at the posterior aspects of the RIGHT major and minor fissures; this could represent infection though underlying tumor is not excluded with this appearance. Chronic subsegmental atelectasis versus scarring in lingula. Previously identified LEFT lower lobe pulmonary nodule is in not visualized on current study, question resolved versus obscured by mild atelectasis or infiltrate dependently in LEFT lower lobe. Minimal pericardial effusion. Splenomegaly with question new small splenic infarct versus flow artifact at medial spleen. Aortic Atherosclerosis (ICD10-I70.0) and Emphysema (ICD10-J43.9). These results will be called to the ordering clinician or representative by the Radiologist Assistant, and communication documented in the PACS or zVision Dashboard. Electronically Signed   By: Lavonia Dana M.D.   On: 01/05/2018 12:52  Mr Brain Wo Contrast  Result Date: 01/06/2018 CLINICAL DATA:  Unexplained dysphagia. Three 4 days of intermittent lightheadedness and dizziness EXAM: MRI HEAD WITHOUT CONTRAST TECHNIQUE: Multiplanar, multiecho pulse sequences of the brain and surrounding structures were obtained without intravenous contrast. COMPARISON:  Head CT from yesterday. FINDINGS: Brain: No acute infarction, hemorrhage, hydrocephalus, extra-axial collection or mass lesion. Age normal brain volume. Minimal for age signal abnormality in the cerebral white matter and pons, presumed remote microvascular insults. No chronic blood products. No finding in the brainstem, cisterns, or skull base to explain history of dysphagia. Vascular: Major flow voids are preserved Skull and upper cervical spine: No evident marrow lesion. The visible cervical disc spaces are narrowed with endplate spurring. Sinuses/Orbits: Mild mucosal thickening in the paranasal sinuses, mainly maxillary sinuses. Negative orbits. IMPRESSION: Unremarkable MRI of the brain for age.  No explanation for symptoms. Electronically Signed   By: Monte Fantasia M.D.   On: 01/06/2018 10:24   Ct Hip Right Wo Contrast  Result Date: 01/14/2018 CLINICAL DATA:  Possible right hip fracture. EXAM: CT OF THE RIGHT HIP WITHOUT CONTRAST TECHNIQUE: Multidetector CT imaging of the right hip was performed according to the standard protocol. Multiplanar CT image reconstructions were also generated. COMPARISON:  Pelvis radiograph 01/14/2018 FINDINGS: Bones/Joint/Cartilage There is no evidence of fracture. Ligaments Suboptimally assessed by CT. Muscles and Tendons Normal. Soft tissues Normal. IMPRESSION: No evidence of hip fracture. Electronically Signed   By: Fidela Salisbury M.D.   On: 01/14/2018 23:11   US Carotid Bilateral  Result Date: 01/07/2018 CLINICAL DATA:  TIA, dizziness, hypertension and hyperlipidemia. EXAM: BILATERAL CAROTID DUPLEX ULTRASOUND TECHNIQUE: Pearline Cables scale imaging, color Doppler and duplex ultrasound were performed of bilateral carotid and vertebral arteries in the neck. COMPARISON:  None. FINDINGS: Criteria: Quantification of carotid stenosis is based on velocity parameters that correlate the residual internal carotid diameter with NASCET-based stenosis levels, using the diameter of the distal internal carotid lumen as the denominator for stenosis measurement. The following velocity measurements were obtained: RIGHT ICA:  81/21 cm/sec CCA:  12/87 cm/sec SYSTOLIC ICA/CCA RATIO:  1.2 ECA:  86 cm/sec LEFT ICA:  103/22 cm/sec CCA:  86/76 cm/sec SYSTOLIC ICA/CCA RATIO:  1.5 ECA:  82 cm/sec RIGHT CAROTID ARTERY: There is a mild amount of predominately calcified plaque at the level of the distal bulb and proximal right ICA. Estimated right ICA stenosis is less than 50%. RIGHT VERTEBRAL ARTERY: Antegrade flow with normal waveform and velocity. LEFT CAROTID ARTERY: Moderate calcified plaque at the level of the distal common carotid artery. Mild plaque at the level of the carotid bulb and proximal ICA. Estimated left ICA stenosis is less than 50%. LEFT  VERTEBRAL ARTERY: Antegrade flow with normal waveform and velocity. IMPRESSION: No significant evidence of ICA stenosis with estimated bilateral ICA stenoses of less than 50%. Plaque is present bilaterally with mild plaque at the level of the carotid bulbs and proximal internal carotid arteries bilaterally and moderate plaque in the distal aspect of the left common carotid artery. Electronically Signed   By: Aletta Edouard M.D.   On: 01/07/2018 08:37   Ct Head Code Stroke Wo Contrast  Result Date: 01/05/2018 CLINICAL DATA:  Code stroke. 77 y/o F; episode of slurred speech and slow to respond starting 30 minutes ago. EXAM: CT HEAD WITHOUT CONTRAST TECHNIQUE: Contiguous axial images were obtained from the base of the skull through the vertex without intravenous contrast. COMPARISON:  None. FINDINGS: Brain: No evidence of acute infarction, hemorrhage, hydrocephalus, extra-axial collection  or mass lesion/mass effect. Few foci of white matter hypoattenuation are nonspecific but consistent with mild chronic microvascular ischemic changes. There is mild volume loss of the brain. Dystrophic calcifications within the basal ganglia noted. Vascular: Calcific atherosclerosis of the carotid siphons. No hyperdense vessel identified. Skull: Normal. Negative for fracture or focal lesion. Sinuses/Orbits: No acute finding. Other: None. ASPECTS [facility] Stroke Program Early CT Score) - Ganglionic level infarction (caudate, lentiform nuclei, internal capsule, insula, M1-M3 cortex): 7 - Supraganglionic infarction (M4-M6 cortex): 3 Total score (0-10 with 10 being normal): 10 IMPRESSION: 1. No acute intracranial abnormality identified. 2. ASPECTS is 10 3. Mild chronic microvascular ischemic changes and volume loss of the brain. These results were called by telephone at the time of interpretation on 01/05/2018 at 4:21 pm to Dr. Vaughan Basta , who verbally acknowledged these results. Electronically Signed   By: Kristine Garbe M.D.   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  PLAN:    1.  Hodgkin's lymphoma: Patient was initially diagnosed in February 2013.  PET scan results from September 17, 2017 reviewed independently and reported as above with no evidence of disease.  No intervention is needed at this time.   2.  Left lower lobe nodule: Mildly increased in size from 3 to 7 mm.  Continue to monitor closely. 2.  Left upper lobe lung cancer: Previously biopsied and confirmed squamous cell carcinoma.  Patient was subsequently treated with XRT only.  PET scan results as above.  No intervention needed, continue to monitor.   3. Right lower lobe nodule: Also biopsied and confirmed to be squamous cell carcinoma.  Patient completed treatment with XRT in November 2018.  PET scan results as above.  Return to clinic in 6 months with repeat imaging using CT of the chest and further evaluation.   4.  Leukopenia: Patient's white blood cell count is 3.1 and relatively stable.  No intervention is needed.   Continue to monitor.   5. Hypercalcemia: Resolved.  Approximately 30 minutes spent in discussion of which greater than 50% was consultation.  Patient expressed understanding and was in agreement with this plan. She also understands that She can call clinic at any time with any questions, concerns, or complaints.   Lloyd Huger, MD   01/15/2018 11:05 PM

## 2018-01-15 NOTE — Progress Notes (Signed)
Pharmacy Antibiotic Note  Marie Lowe is a 77 y.o. female admitted on 01/14/2018 with pneumonia.  Pharmacy has been consulted for vanc/zosyn dosing. Patient received azithromycin 500 mg, ceftriaxone 1g, vanc 1.25g, zosyn 3.375g IV x 1 in ED  Plan: Will continue w/ vanc 750 mg IV q24h, will draw vanc trough 08/22 @ 0100 prior to 4th dose CrCl has improved therefore I will increase zosyn to 3.375g q 8 hr    Height: 5\' 4"  (162.6 cm) Weight: 128 lb 6.4 oz (58.2 kg) IBW/kg (Calculated) : 54.7  Temp (24hrs), Avg:98.2 F (36.8 C), Min:98 F (36.7 C), Max:98.3 F (36.8 C)  Recent Labs  Lab 01/14/18 1723 01/14/18 1727 01/15/18 0426  WBC 3.8  --  3.1*  CREATININE 1.61*  --  1.45*  LATICACIDVEN  --  1.4  --     Estimated Creatinine Clearance: 28.1 mL/min (A) (by C-G formula based on SCr of 1.45 mg/dL (H)).    Allergies  Allergen Reactions  . Hydrochlorothiazide Other (See Comments)    Elevation of creatinine Patient doesn't remember what reaction she had to it.    Thank you for allowing pharmacy to be a part of this patient's care.  Ramond Dial, Pharm.D, BCPS Clinical Pharmacist 01/15/2018

## 2018-01-15 NOTE — Plan of Care (Signed)
  Problem: Health Behavior/Discharge Planning: Goal: Ability to manage health-related needs will improve Outcome: Progressing   Problem: Clinical Measurements: Goal: Ability to maintain clinical measurements within normal limits will improve Outcome: Progressing Goal: Will remain free from infection Outcome: Progressing Goal: Diagnostic test results will improve Outcome: Progressing Goal: Respiratory complications will improve Outcome: Progressing   Problem: Activity: Goal: Risk for activity intolerance will decrease Outcome: Progressing   Problem: Nutrition: Goal: Adequate nutrition will be maintained Outcome: Progressing   Problem: Pain Managment: Goal: General experience of comfort will improve Outcome: Progressing   Problem: Safety: Goal: Ability to remain free from injury will improve Outcome: Progressing

## 2018-01-15 NOTE — Progress Notes (Addendum)
[facility] NAME: Marie Lowe    MR#:  [id number]  DATE OF BIRTH:  Aug 31, 1940  SUBJECTIVE:  CHIEF COMPLAINT:   Chief Complaint  Patient presents with  . Altered Mental Status  Patient without complaint, patient exhibits visual hallucinations, family updated regarding care with all questions answered, replete potassium, continue to encourage p.o. hydration, consult dietary, boost per patient request, per nursing staff-concern for possible dysphasia-we will consult speech therapy for expert opinion  REVIEW OF SYSTEMS:  CONSTITUTIONAL: No fever, fatigue or weakness.  EYES: No blurred or double vision.  EARS, NOSE, AND THROAT: No tinnitus or ear pain.  RESPIRATORY: No cough, shortness of breath, wheezing or hemoptysis.  CARDIOVASCULAR: No chest pain, orthopnea, edema.  GASTROINTESTINAL: No nausea, vomiting, diarrhea or abdominal pain.  GENITOURINARY: No dysuria, hematuria.  ENDOCRINE: No polyuria, nocturia,  HEMATOLOGY: No anemia, easy bruising or bleeding SKIN: No rash or lesion. MUSCULOSKELETAL: No joint pain or arthritis.   NEUROLOGIC: No tingling, numbness, weakness.  PSYCHIATRY: No anxiety or depression.   ROS  DRUG ALLERGIES:   Allergies  Allergen Reactions  . Hydrochlorothiazide Other (See Comments)    Elevation of creatinine Patient doesn't remember what reaction she had to it.    VITALS:  Blood pressure (!) 184/81, pulse 80, temperature 98.3 F (36.8 C), temperature source Oral, resp. rate 16, height 5\' 4"  (1.626 m), weight 58.2 kg, SpO2 100 %.  PHYSICAL EXAMINATION:  GENERAL:  77 y.o.-year-old patient lying in the bed with no acute distress.  EYES: Pupils equal, round, reactive to light and accommodation. No scleral icterus. Extraocular muscles intact.  HEENT: Head atraumatic, normocephalic. Oropharynx and nasopharynx clear.  NECK:  Supple, no jugular venous distention. No thyroid enlargement, no tenderness.   LUNGS: Normal breath sounds bilaterally, no wheezing, rales,rhonchi or crepitation. No use of accessory muscles of respiration.  CARDIOVASCULAR: S1, S2 normal. No murmurs, rubs, or gallops.  ABDOMEN: Soft, nontender, nondistended. Bowel sounds present. No organomegaly or mass.  EXTREMITIES: No pedal edema, cyanosis, or clubbing.  NEUROLOGIC: Cranial nerves II through XII are intact. Muscle strength 5/5 in all extremities. Sensation intact. Gait not checked.  PSYCHIATRIC: The patient is alert and oriented x 3.  SKIN: No obvious rash, lesion, or ulcer.   Physical Exam LABORATORY PANEL:   CBC Recent Labs  Lab 01/15/18 0426  WBC 3.1*  HGB 11.0*  HCT 32.4*  PLT 46*   ------------------------------------------------------------------------------------------------------------------  Chemistries  Recent Labs  Lab 01/15/18 0426 01/15/18 0758  NA 140  --   K 3.2*  --   CL 107  --   CO2 25  --   GLUCOSE 92  --   BUN 14  --   CREATININE 1.45*  --   CALCIUM 8.8*  --   MG  --  1.7  AST 37  --   ALT 23  --   ALKPHOS 179*  --   BILITOT 2.1*  --    ------------------------------------------------------------------------------------------------------------------  Cardiac Enzymes Recent Labs  Lab 01/14/18 1723  TROPONINI 0.06*   ------------------------------------------------------------------------------------------------------------------  RADIOLOGY:  Dg Chest 2 View  Result Date: 01/14/2018 CLINICAL DATA:  Weakness. Abdominal pain. History of COPD, lung cancer, non-Hodgkin's lymphoma. EXAM: CHEST - 2 VIEW COMPARISON:  CT chest January 05, 2018 and chest radiograph January 03, 2018. FINDINGS: Stable cardiomegaly. Mediastinal silhouette is not suspicious. Calcified aortic arch. LEFT mid lung zone bandlike density. Chronic interstitial changes without pleural effusion. Patchy RIGHT mid and lower lung zone airspace  opacities. Biapical bullous changes. No pneumothorax. Mild  degenerative changes thoracic spine. Osteopenia. IMPRESSION: 1. Stable cardiomegaly. 2. COPD with LEFT mid lung zone scarring. Similar patchy RIGHT mid and lower lung zone airspace opacities. Aortic Atherosclerosis (ICD10-I70.0). Electronically Signed   By: Elon Alas M.D.   On: 01/14/2018 18:57   Dg Lumbar Spine Complete  Result Date: 01/14/2018 CLINICAL DATA:  Back pain after fall EXAM: LUMBAR SPINE - COMPLETE 4+ VIEW COMPARISON:  CT 10/07/2015 FINDINGS: Lumbar alignment within normal limits. Moderate degenerative change at L5-S1. Mild degenerative changes elsewhere in the spine. Vertebral body heights are grossly maintained. Heavily calcified aorta. IMPRESSION: Mild to moderate multilevel degenerative changes. No definite acute osseous abnormality. Electronically Signed   By: Donavan Foil M.D.   On: 01/14/2018 20:40   Dg Pelvis 1-2 Views  Result Date: 01/14/2018 CLINICAL DATA:  Low back pain after fall EXAM: PELVIS - 1-2 VIEW COMPARISON:  CT 10/07/2015 FINDINGS: SI joint are non widened. Pubic symphysis and rami are intact. Both femoral heads project in joint. Possible step-off deformity at the right subcapital femur. IMPRESSION: Possible subcapital step-off deformity involving proximal right femur. Suggest CT for further evaluation. Electronically Signed   By: Donavan Foil M.D.   On: 01/14/2018 20:37   Ct Head Wo Contrast  Result Date: 01/14/2018 CLINICAL DATA:  Fall with unknown LOC EXAM: CT HEAD WITHOUT CONTRAST TECHNIQUE: Contiguous axial images were obtained from the base of the skull through the vertex without intravenous contrast. COMPARISON:  MRI 01/06/2018, CT brain 01/05/2018 FINDINGS: Brain: No acute territorial infarction, hemorrhage or intracranial mass. The ventricles are of normal size. Mild atrophy. Minimal small vessel ischemic changes of the white matter. Vascular: No hyperdense vessels.  Carotid vascular calcification. Skull: Normal. Negative for fracture or focal lesion.  Sinuses/Orbits: No acute finding. Other: None IMPRESSION: No CT evidence for acute intracranial abnormality. Atrophy and minimal small vessel ischemic change of the white matter Electronically Signed   By: Donavan Foil M.D.   On: 01/14/2018 18:34   Ct Hip Right Wo Contrast  Result Date: 01/14/2018 CLINICAL DATA:  Possible right hip fracture. EXAM: CT OF THE RIGHT HIP WITHOUT CONTRAST TECHNIQUE: Multidetector CT imaging of the right hip was performed according to the standard protocol. Multiplanar CT image reconstructions were also generated. COMPARISON:  Pelvis radiograph 01/14/2018 FINDINGS: Bones/Joint/Cartilage There is no evidence of fracture. Ligaments Suboptimally assessed by CT. Muscles and Tendons Normal. Soft tissues Normal. IMPRESSION: No evidence of hip fracture. Electronically Signed   By: Fidela Salisbury M.D.   On: 01/14/2018 23:11    ASSESSMENT AND PLAN:  Jonah Nestle  is a 77 y.o. female with a known history of COPD, Hodgkin's lymphoma, lung cancer, seeing Dr. Grayland Ormond as an outpatient, hypertension, chronic kidney disease, GERD and other medical problems is brought into the ED as patient has been altered in her mental status is getting worse.  Patient was just recently admitted to the hospital with hyperkalemia and a community-acquired pneumonia and discharged home with antibiotics but patient seems to be more confused than her baseline and patient is brought into the emergency department.  CT head is negative , chest x-ray reveals Similar patchy RIGHT mid and lower lung zone airspace opacities  *Acute encephalopathy  Noted visual hallucinations this morning concerning for possible organic brain disease versus undiagnosed psychiatric illness  Suspected due to pneumonia, acute kidney injury Change antibiotic regimen to Augmentin, IV fluids for rehydration, neurochecks per routine, psychiatric evaluation for further evaluation, neurology to see  MRI  of the brain done on January 06, 2018 was negative for any acute process   *Acute hallucinations ?  Etiology  Psychiatry to see  *AKI Resolving Continue IVFs, avoid nephrotoxins, and check BMP in am  *Acute hypertensive urgency Resolving Increase beta-blocker therapy, IV hydralazine as needed, vitals per routine, make changes as per necessary  *History of dysphagia Speech therapy to see, aspiration precautions  *Hypokalemia Replete with p.o. potassium, magnesium level was normal, BMP in the morning  *Chronic COPD w/o exacerbation BTs  *History of lung cancer and Hodgkin's lymphoma Outpatient follow-up with Dr. Grayland Ormond s/p discharge  *Thrombocytopenia no active bleeding or bruising Conservative monitoring   DVT prophylaxis-SCDs as the patient has thrombocytopenia   All the records are reviewed and case discussed with Care Management/Social Workerr. Management plans discussed with the patient, family and they are in agreement.  CODE STATUS: dnr  TOTAL TIME TAKING CARE OF THIS PATIENT: 45 minutes.     POSSIBLE D/C IN 1-3 DAYS, DEPENDING ON CLINICAL CONDITION.   Avel Peace Salary M.D on 01/15/2018   Between 7am to 6pm - Pager - [phone]  After 6pm go to [email or web address] - password [email or web address] [facility]  Office  [phone]  CC: Primary care physician; Marinda Elk, MD  Note: This dictation was prepared with Dragon dictation along with smaller phrase technology. Any transcriptional errors that result from this process are unintentional.

## 2018-01-15 NOTE — Progress Notes (Signed)
Pharmacy Antibiotic Note  Marie Lowe is a 77 y.o. female admitted on 01/14/2018 with pneumonia.  Pharmacy has been consulted for vanc/zosyn dosing. Patient received azithromycin 500 mg, ceftriaxone 1g, vanc 1.25g, zosyn 3.375g IV x 1 in ED  Plan: Will continue w/ vanc 750 mg IV q24h, will draw vanc trough 08/22 @ 0100 prior to 4th dose Considering CrCl close to 20 ml/min will dose zosyn 3.375g IV q12h. Will increase back to q8h dosing if renal function improves.  Ke 0.0254 T1/2 27 ~ 24 hrs Goal trough 15 - 20 mcg/mL  Height: 5\' 4"  (162.6 cm) Weight: 128 lb 6.4 oz (58.2 kg) IBW/kg (Calculated) : 54.7  Temp (24hrs), Avg:98.2 F (36.8 C), Min:98 F (36.7 C), Max:98.3 F (36.8 C)  Recent Labs  Lab 01/14/18 1723 01/14/18 1727  WBC 3.8  --   CREATININE 1.61*  --   LATICACIDVEN  --  1.4    Estimated Creatinine Clearance: 25.3 mL/min (A) (by C-G formula based on SCr of 1.61 mg/dL (H)).    Allergies  Allergen Reactions  . Hydrochlorothiazide Other (See Comments)    Elevation of creatinine Patient doesn't remember what reaction she had to it.    Thank you for allowing pharmacy to be a part of this patient's care.  Tobie Lords, PharmD, BCPS Clinical Pharmacist 01/15/2018

## 2018-01-15 NOTE — Evaluation (Signed)
Physical Therapy Evaluation Patient Details Name: Marie Lowe MRN: [id number] DOB: August 02, 1940 Today's Date: 01/15/2018   History of Present Illness  Pt is a 78 y.o. female with a known history of COPD, dysphagia, Hodgkin's lymphoma, lung cancer, seeing Dr. Grayland Ormond as an outpatient, hypertension, chronic kidney disease, GERD and other medical problems was brought to the ED as patient has been altered in her mental status and getting worse.  Patient was recently admitted to the hospital with hyperkalemia and a community-acquired pneumonia and discharged home with antibiotics but patient seemed to be more confused than her baseline.  CT head was negative , chest x-ray revealed Similar patchy RIGHT mid and lower lung zone airspace opacities.  Blood pressure was significantly elevated and patient seemed to be dehydrated.   Assessment includes: acute encephalopathy likely due to PNA and AKI, acute hallucinations, acute hypertensive urgency, and hypokalemia.    Clinical Impression  Pt presents with deficits in strength, transfers, mobility, gait, balance, and activity tolerance.  Pt found on 2LO2/min with SpO2 100% and HR 82 bpm.  Per family pt only wears O2 at night.  Spoke to nursing who stated OK for trial with PT on room air.  Pt required min A with sit to supine to get her BLEs into the bed but was able to perform sup to sit with only SBA and extra time and effort.  Pt was CGA during transfers with extensive verbal and tactile cues for sequencing.  Pt was able to amb around 15 feet with a RW with slow, cautious steps and cues for general safety with a RW.  While turning pt lost her balance posteriorly and required min A to prevent a fall.  Upon returning to sitting after ambulation pt's SpO2 was 94% and her HR 105 bpm.  Pt participated in therapeutic exercise per below and at the end of the session pt's SpO2 was 89% with HR in the low 90s.  Pt's SpO2 did not increase with cues for PLB and pt was  returned to 2LO2/min with nursing notified.  Overall pt is at a very high risk for falls and presents with significant deficits in general functional mobility compared to her stated baseline.  Pt will benefit from PT services in a SNF setting upon discharge to safely address above deficits for decreased caregiver assistance and eventual return to PLOF.     Follow Up Recommendations SNF;Supervision for mobility/OOB    Equipment Recommendations  Rolling walker with 5" wheels;Other (comment)(TBD at next venue of care with SNF placement)    Recommendations for Other Services       Precautions / Restrictions Precautions Precautions: Fall Restrictions Weight Bearing Restrictions: No      Mobility  Bed Mobility Overal bed mobility: Needs Assistance Bed Mobility: Supine to Sit;Sit to Supine     Supine to sit: Supervision Sit to supine: Min assist   General bed mobility comments: Extra time and effort during sup to sit and min A for BLEs into bed during sit to supine  Transfers Overall transfer level: Needs assistance Equipment used: Rolling walker (2 wheeled) Transfers: Sit to/from Stand Sit to Stand: Min guard         General transfer comment: Mod verbal and tactile cues for proper sequencing/hand placement  Ambulation/Gait Ambulation/Gait assistance: Min assist Gait Distance (Feet): 15 Feet Assistive device: Rolling walker (2 wheeled) Gait Pattern/deviations: Step-through pattern;Decreased step length - right;Decreased step length - left;Trunk flexed Gait velocity: decreased   General Gait Details: Pt unsteady during  amb most notably during turns where the pt required min A to prevent posterior LOB  Stairs            Wheelchair Mobility    Modified Rankin (Stroke Patients Only)       Balance Overall balance assessment: Needs assistance Sitting-balance support: Bilateral upper extremity supported;Feet supported Sitting balance-Leahy Scale: Good      Standing balance support: Bilateral upper extremity supported;During functional activity Standing balance-Leahy Scale: Poor Standing balance comment: Posterior instability during turns with the RW                             Pertinent Vitals/Pain Pain Assessment: No/denies pain    Home Living Family/patient expects to be discharged to:: Private residence(History from pt and daughters) Living Arrangements: Spouse/significant other Available Help at Discharge: Family;Available PRN/intermittently(Per daughters spouse is unable to assist pt secondary to aphasia and possible cognitive deficits) Type of Home: House Home Access: Level entry     Home Layout: One level Home Equipment: Cane - single point;Cane - quad      Prior Function Level of Independence: Independent with assistive device(s)         Comments: Patient ambulates with SPC, independent for ADLs, IADLs, 2 falls in the last 6 months     Hand Dominance   Dominant Hand: Right    Extremity/Trunk Assessment   Upper Extremity Assessment Upper Extremity Assessment: Generalized weakness    Lower Extremity Assessment Lower Extremity Assessment: Generalized weakness RLE Deficits / Details: 3+/5 grossly LLE Deficits / Details: 3+/5 grossly       Communication   Communication: No difficulties  Cognition Arousal/Alertness: Awake/alert Behavior During Therapy: WFL for tasks assessed/performed Overall Cognitive Status: Impaired/Different from baseline Area of Impairment: Orientation;Following commands;Safety/judgement;Awareness                       Following Commands: Follows one step commands with increased time Safety/Judgement: Decreased awareness of safety     General Comments: Per family pt more confused than at baseline with new onset of hallucinations       General Comments      Exercises Total Joint Exercises Ankle Circles/Pumps: AROM;Strengthening;Both;5 reps;10 reps Heel  Slides: AROM;Both;10 reps Hip ABduction/ADduction: AROM;Both;10 reps Straight Leg Raises: AROM;Both;10 reps Long Arc Quad: AROM;Both;10 reps Knee Flexion: AROM;Both;10 reps Marching in Standing: AROM;Both;5 reps;Standing   Assessment/Plan    PT Assessment Patient needs continued PT services  PT Problem List Decreased strength;Decreased knowledge of use of DME;Decreased activity tolerance;Decreased safety awareness;Decreased balance;Decreased knowledge of precautions;Decreased mobility       PT Treatment Interventions DME instruction;Balance training;Gait training;Functional mobility training;Patient/family education;Therapeutic activities;Therapeutic exercise    PT Goals (Current goals can be found in the Care Plan section)  Acute Rehab PT Goals Patient Stated Goal: To get stronger and safer prior to discharge home PT Goal Formulation: With patient/family Time For Goal Achievement: 01/28/18 Potential to Achieve Goals: Good    Frequency Min 2X/week   Barriers to discharge Decreased caregiver support      Co-evaluation               AM-PAC PT "6 Clicks" Daily Activity  Outcome Measure Difficulty turning over in bed (including adjusting bedclothes, sheets and blankets)?: A Little Difficulty moving from lying on back to sitting on the side of the bed? : A Little Difficulty sitting down on and standing up from a chair with arms (e.g., wheelchair, bedside  commode, etc,.)?: Unable Help needed moving to and from a bed to chair (including a wheelchair)?: A Little Help needed walking in hospital room?: A Little Help needed climbing 3-5 steps with a railing? : A Lot 6 Click Score: 15    End of Session Equipment Utilized During Treatment: Gait belt;Oxygen Activity Tolerance: Patient tolerated treatment well Patient left: in chair;with chair alarm set;with call bell/phone within reach;with family/visitor present Nurse Communication: Mobility status;Other (comment)(Results of  trial O2 wean per above) PT Visit Diagnosis: Unsteadiness on feet (R26.81);History of falling (Z91.81);Difficulty in walking, not elsewhere classified (R26.2);Muscle weakness (generalized) (M62.81)    Time: 5597-4163 PT Time Calculation (min) (ACUTE ONLY): 43 min   Charges:   PT Evaluation $PT Eval Low Complexity: 1 Low PT Treatments $Therapeutic Exercise: 8-22 mins        D. Royetta Asal PT, DPT 01/15/18, 2:51 PM

## 2018-01-15 NOTE — Progress Notes (Signed)
Pt very agitated and and trying to get out of bed, DR. Maier notified and Seroquel 25 mg ordered and another 25 mg ordered to give in 1 hour if first dose is not effective. Will continue to monitor to prevent injuries/falls

## 2018-01-16 ENCOUNTER — Inpatient Hospital Stay: Payer: Medicare Other | Admitting: Oncology

## 2018-01-16 DIAGNOSIS — R410 Disorientation, unspecified: Secondary | ICD-10-CM

## 2018-01-16 DIAGNOSIS — R627 Adult failure to thrive: Secondary | ICD-10-CM

## 2018-01-16 DIAGNOSIS — R4182 Altered mental status, unspecified: Secondary | ICD-10-CM

## 2018-01-16 DIAGNOSIS — E43 Unspecified severe protein-calorie malnutrition: Secondary | ICD-10-CM

## 2018-01-16 DIAGNOSIS — F05 Delirium due to known physiological condition: Secondary | ICD-10-CM

## 2018-01-16 DIAGNOSIS — F0390 Unspecified dementia without behavioral disturbance: Secondary | ICD-10-CM

## 2018-01-16 LAB — BASIC METABOLIC PANEL
Anion gap: 7 (ref 5–15)
BUN: 15 mg/dL (ref 8–23)
CHLORIDE: 110 mmol/L (ref 98–111)
CO2: 21 mmol/L — ABNORMAL LOW (ref 22–32)
CREATININE: 1.25 mg/dL — AB (ref 0.44–1.00)
Calcium: 8.4 mg/dL — ABNORMAL LOW (ref 8.9–10.3)
GFR calc Af Amer: 47 mL/min — ABNORMAL LOW (ref >60)
GFR, EST NON AFRICAN AMERICAN: 40 mL/min — AB (ref >60)
GLUCOSE: 126 mg/dL — AB (ref 70–99)
Potassium: 3.9 mmol/L (ref 3.5–5.1)
Sodium: 138 mmol/L (ref 135–145)

## 2018-01-16 LAB — RPR: RPR: NONREACTIVE

## 2018-01-16 MED ORDER — MEGESTROL ACETATE 400 MG/10ML PO SUSP
400.0000 mg | Freq: Two times a day (BID) | ORAL | Status: DC
  Administered 2018-01-16 – 2018-01-17 (×3): 400 mg via ORAL
  Filled 2018-01-16 (×4): qty 10

## 2018-01-16 MED ORDER — RISPERIDONE 0.25 MG PO TABS
0.2500 mg | ORAL_TABLET | Freq: Once | ORAL | Status: AC
  Administered 2018-01-16: 0.25 mg via ORAL
  Filled 2018-01-16: qty 1

## 2018-01-16 MED ORDER — AMLODIPINE BESYLATE 10 MG PO TABS
10.0000 mg | ORAL_TABLET | Freq: Every day | ORAL | Status: DC
  Administered 2018-01-16 – 2018-01-17 (×2): 10 mg via ORAL
  Filled 2018-01-16 (×2): qty 1

## 2018-01-16 MED ORDER — ADULT MULTIVITAMIN W/MINERALS CH
1.0000 | ORAL_TABLET | Freq: Every day | ORAL | Status: DC
  Administered 2018-01-17: 09:00:00 1 via ORAL
  Filled 2018-01-16: qty 1

## 2018-01-16 NOTE — NC FL2 (Signed)
[place] LEVEL OF CARE SCREENING TOOL     IDENTIFICATION  Patient Name: Marie Lowe Birthdate: Jan 18, 1941 Sex: female Admission Date (Current Location): 01/14/2018  [place] and [place] Number:  Engineering geologist and Address:  [facility], [address], [place], [place] [address]      Provider Number: [id number]  Attending Physician Name and Address:  Gorden Harms, MD  Relative Name and Phone Number:  Crawford Givens- daughter [phone]    Current Level of Care: Hospital Recommended Level of Care: [place] Prior Approval Number:    Date Approved/Denied:   PASRR Number: [id number]  Discharge Plan: SNF    Current Diagnoses: Patient Active Problem List   Diagnosis Date Noted  . Acute encephalopathy 01/14/2018  . Hypercalcemia 01/03/2018  . Neutropenia ([employer]) 01/08/2017  . Elevated alkaline phosphatase level 01/08/2017  . COPD exacerbation ([place]) 07/12/2016  . Hyperlipidemia 07/12/2016  . Primary cancer of left upper lobe of lung ([place]) 01/24/2016  . Pulmonary nodule, right 01/24/2016  . Hodgkin's lymphoma ([place]) 01/24/2016    Orientation RESPIRATION BLADDER Height & Weight     Self, Place  O2(2 liters ) Continent Weight: 128 lb 6.4 oz (58.2 kg) Height:  5\' 4"  (162.6 cm)  BEHAVIORAL SYMPTOMS/MOOD NEUROLOGICAL BOWEL NUTRITION STATUS  (none) (none) Continent Diet(Nectar thick liquids )  AMBULATORY STATUS COMMUNICATION OF NEEDS Skin   Extensive Assist Verbally Normal                       Personal Care Assistance Level of Assistance  Bathing, Feeding, Dressing Bathing Assistance: Limited assistance Feeding assistance: Independent Dressing Assistance: Limited assistance     Functional Limitations Info  Sight, Hearing, Speech Sight Info: Adequate Hearing Info: Adequate Speech Info: Adequate    SPECIAL CARE FACTORS FREQUENCY  PT (By licensed PT), OT (By licensed OT)                   Contractures Contractures Info: Not present    Additional Factors Info  Code Status, Allergies Code Status Info: DNR Allergies Info:  Hydrochlorothiazide           Current Medications (01/16/2018):  This is the current hospital active medication list Current Facility-Administered Medications  Medication Dose Route Frequency Provider Last Rate Last Dose  . 0.9 % NaCl with KCl 20 mEq/ L  infusion   Intravenous Continuous Salary, Montell D, MD 50 mL/hr at 01/16/18 0300    . acetaminophen (TYLENOL) tablet 650 mg  650 mg Oral Q6H PRN Nicholes Mango, MD   650 mg at 01/16/18 0620   Or  . acetaminophen (TYLENOL) suppository 650 mg  650 mg Rectal Q6H PRN Gouru, Aruna, MD      . albuterol (PROVENTIL) (2.5 MG/3ML) 0.083% nebulizer solution 2.5 mg  2.5 mg Inhalation Q6H PRN Gouru, Aruna, MD      . amoxicillin-clavulanate (AUGMENTIN) 500-125 MG per tablet 500 mg  1 tablet Oral BID Salary, Montell D, MD   500 mg at 01/15/18 2006  . antiseptic oral rinse (BIOTENE) solution 15 mL  15 mL Mouth Rinse PRN Gouru, Aruna, MD      . benazepril (LOTENSIN) tablet 20 mg  20 mg Oral Daily Salary, Montell D, MD      . carvedilol (COREG) tablet 12.5 mg  12.5 mg Oral BID WC Salary, Montell D, MD   12.5 mg at 01/15/18 1740  . docusate sodium (COLACE) capsule 100 mg  100 mg  Oral BID Nicholes Mango, MD   100 mg at 01/15/18 2005  . labetalol (NORMODYNE,TRANDATE) injection 10 mg  10 mg Intravenous Q2H PRN Gouru, Aruna, MD   10 mg at 01/15/18 2027  . mirtazapine (REMERON) tablet 15 mg  15 mg Oral QHS Gouru, Illene Silver, MD   15 mg at 01/15/18 2004  . ondansetron (ZOFRAN) tablet 4 mg  4 mg Oral Q6H PRN Gouru, Aruna, MD       Or  . ondansetron (ZOFRAN) injection 4 mg  4 mg Intravenous Q6H PRN Gouru, Aruna, MD      . triamcinolone cream (KENALOG) 0.1 % 1 application  1 application Topical BID Gouru, Aruna, MD   1 application at 16/60/60 2008  . vitamin B-12 (CYANOCOBALAMIN) tablet 1,000 mcg  1,000 mcg Oral Daily Gouru, Aruna, MD    1,000 mcg at 01/15/18 1352   Facility-Administered Medications Ordered in Other Encounters  Medication Dose Route Frequency Provider Last Rate Last Dose  . heparin lock flush 100 unit/mL  500 Units Intravenous Once Lloyd Huger, MD      . sodium chloride 0.9 % injection 10 mL  10 mL Intravenous PRN Lloyd Huger, MD   10 mL at 02/09/15 0844     Discharge Medications: Please see discharge summary for a list of discharge medications.  Relevant Imaging Results:  Relevant Lab Results:   Additional Information SSN [id number]  Annamaria Boots, [place]

## 2018-01-16 NOTE — Progress Notes (Signed)
Initial Nutrition Assessment  DOCUMENTATION CODES:   Severe malnutrition in context of chronic illness  INTERVENTION:  Provide Hormel Shake (Vital Cuisine) po TID with meals, each supplement provides 520 kcal and 22 grams of protein.  Provide daily MVI.  Encouraged adequate intake of protein at meals.  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   -Patient was initially on clear nectar-thick liquids. Diet advanced today to dysphagia 1 with nectar-thick liquids.  Met with patient and her husband at bedside. Patient reports she had had a decreased appetite for a while now. She is not sure what is causing her decreased appetite. She tries to eat 3 meals per day but can usually only eat 2 meals and those are small. She has been on thickened liquids now related to her dysphagia, which she reports tolerating well. She is on a protein shake but cannot recall which one it is. Patient does not like current pureed diet. She is eating only bites of her lunch tray.  Patient cannot recall her UBW but reports she has lost about 20 lbs over the past year. Per chart she was 66.5 kg on 01/02/2017 and is currently 58.2 kg. She has lost 8.3 kg (12.5% body weight) over the  past year, which is not quite significant for time frame.  Medications reviewed and include: Augmentin, Colace, Megace 400 mg BID, Remeron 15 mg QHS, vitamin B12 1000 micrograms daily, NS with KCl 20 mEq/L at 50 mL/hr.  Labs reviewed: CO2 21, Creatinine 1.25.  NUTRITION - FOCUSED PHYSICAL EXAM:    Most Recent Value  Orbital Region  Severe depletion  Upper Arm Region  Severe depletion  Thoracic and Lumbar Region  Moderate depletion  Buccal Region  Severe depletion  Temple Region  Severe depletion  Clavicle Bone Region  Severe depletion  Clavicle and Acromion Bone Region  Severe depletion  Scapular Bone Region  Severe depletion  Dorsal Hand  Severe depletion  Patellar Region  Moderate depletion  Anterior Thigh Region  Moderate depletion  Posterior Calf Region  Severe depletion  Edema (RD Assessment)  None  Hair  Reviewed  Eyes  Reviewed  Mouth  Reviewed  Skin  Reviewed  Nails  Reviewed     Diet Order:   Diet Order            DIET - DYS 1 Room service appropriate? Yes with Assist; Fluid consistency: Nectar Thick  Diet effective now              EDUCATION NEEDS:   Not appropriate for education at this time  Skin:  Skin Assessment: Reviewed RN Assessment  Last BM:  01/15/2018 (no BM characteristics recorded)  Height:   Ht Readings from Last 1 Encounters:  01/15/18 5' 4"  (1.626 m)    Weight:   Wt Readings from  Last 1 Encounters:  01/15/18 58.2 kg    Ideal Body Weight:  54.5 kg  BMI:  Body mass index is 22.04 kg/m.  Estimated Nutritional Needs:   Kcal:  1455-1745 (25-30 kcal/kg)  Protein:  75-90 grams (1.3-1.5 grams/kg)  Fluid:  1.5 L/day (25 mL/kg)  Willey Blade, MS, RD, LDN Office: [phone] Pager: [phone] After Hours/Weekend Pager: [phone]

## 2018-01-16 NOTE — Clinical Social Work Placement (Signed)
   CLINICAL SOCIAL WORK PLACEMENT  NOTE  Date:  01/16/2018  Patient Details  Name: Marie Lowe MRN: [id number] Date of Birth: 16-Feb-1941  Clinical Social Work is seeking post-discharge placement for this patient at the [place] level of care (*CSW will initial, date and re-position this form in  chart as items are completed):  Yes   Patient/family provided with [facility] Work Department's list of facilities offering this level of care within the geographic area requested by the patient (or if unable, by the patient's family).  Yes   Patient/family informed of their freedom to choose among providers that offer the needed level of care, that participate in Medicare, Medicaid or managed care program needed by the patient, have an available bed and are willing to accept the patient.  Yes   Patient/family informed of [facility]'s ownership interest in [facility] and [facility], as well as of the fact that they are under no obligation to receive care at these facilities.  PASRR submitted to EDS on 01/16/18     PASRR number received on 01/16/18     Existing PASRR number confirmed on       FL2 transmitted to all facilities in geographic area requested by pt/family on 01/16/18     FL2 transmitted to all facilities within larger geographic area on       Patient informed that his/her managed care company has contracts with or will negotiate with certain facilities, including the following:        Yes   Patient/family informed of bed offers received.  Patient chooses bed at [facility]     Physician recommends and patient chooses bed at [facility])    Patient to be transferred to [facility] on  .  Patient to be transferred to facility by       Patient family notified on   of transfer.  Name of family member notified:        PHYSICIAN       Additional Comment:     _______________________________________________ Annamaria Boots, [place] 01/16/2018, 2:48 PM

## 2018-01-16 NOTE — Consult Note (Signed)
Marie Lowe   Reason for Lowe: Lowe for this 77 year old woman without any significant past psychiatric history who was having hallucinations in her room Referring Physician: Tressia Miners Patient Identification: Marie Lowe MRN:  [id number] Principal Diagnosis: Subacute delirium Diagnosis:   Patient Active Problem List   Diagnosis Date Noted  . Protein-calorie malnutrition, severe [E43] 01/16/2018  . Dementia [F03.90] 01/16/2018  . Subacute delirium [F05] 01/16/2018  . Acute encephalopathy [G93.40] 01/14/2018  . Hypercalcemia [E83.52] 01/03/2018  . Neutropenia ([place]) [D70.9] 01/08/2017  . Elevated alkaline phosphatase level [R74.8] 01/08/2017  . COPD exacerbation ([place]) [J44.1] 07/12/2016  . Hyperlipidemia [E78.5] 07/12/2016  . Primary cancer of left upper lobe of lung ([place]) [C34.12] 01/24/2016  . Pulmonary nodule, right [R91.1] 01/24/2016  . Hodgkin's lymphoma ([place]) [C81.90] 01/24/2016    Total Time spent with patient: 1 hour  Subjective:   Marie Lowe is a 77 y.o. female patient admitted with wha"t I was a little confused".  HPI: Patient seen chart reviewed.  Neurology consults reviewed.  This is a 77 year old woman with no significant past psychiatric history in the hospital with multiple medical problems.  Patient was confused delirious unable to give much history and having hallucinations.  Neurology has seen the patient and correctly I believe concluded that she was having "encephalopathy" or as we in psychiatry call it, delirium.  On interview with me today the patient was awake and alert.  Appropriate in her interaction.  Told me at first that she was at her own house in [place] but when I told her she was in the hospital she believed me.  Was able to tell me some details about her medical problems.  Patient denied being sad or depressed.  She said that she was not sure if she was having hallucinations today but she would not be surprised if she  was but did not seem to be very upset by it.  Denied any suicidal or homicidal ideation.  Patient short-term memory is distinctly impaired.  Medical history: Multiple medical problems including COPD pulmonary disease Hodgkin's lymphoma.  Social history: Evidently had been living with her husband until recently.  Adult children are involved.  It looks like the plan now is for discharge to skilled nursing.  Substance abuse history: None  Past Psychiatric History: Chart reviewed.  Does not appear to have any past psychiatric history other than some acknowledgment of a little bit of memory loss which seems to be getting worse.  Risk to Self:   Risk to Others:   Prior Inpatient Therapy:   Prior Outpatient Therapy:    Past Medical History:  Past Medical History:  Diagnosis Date  . Anemia   . Chest pain, unspecified   . Chronic kidney disease   . COPD (chronic obstructive pulmonary disease) ([place])   . Epistaxis   . Erosive gastropathy   . GERD (gastroesophageal reflux disease)   . High cholesterol   . Hodgkin's lymphoma ([place]) 2014   chemo  . Hodgkin's lymphoma ([place])   . Hypercalcemia   . Hypertension   . Lung cancer ([place]) 2015  . Pneumonia   . 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 Shortness of breath dyspnea     Past Surgical History:  Procedure Laterality Date  . ABDOMINAL HYSTERECTOMY    . APPENDECTOMY    . COLONOSCOPY WITH PROPOFOL N/A 10/01/2015   Procedure: COLONOSCOPY WITH PROPOFOL;  Surgeon: Manya Silvas, MD;  [facility] END[facility]COPY;  Service: Endoscopy;  Laterality: N/A;  .  NASAL SINUS SURGERY    . PORTA CATH REMOVAL N/A 07/21/2016   Procedure: Glori Luis Cath Removal;  Surgeon: Algernon Huxley, MD;  Location: [facility] CV LAB;  Service: Cardiovascular;  Laterality: N/A;  . right lower lobe wedge resection     Family History:  Family History  Problem Relation Age of Onset  . Diabetes Other   . CAD Other   . Cancer Other        colon cancer, breast cancer  . Kidney  disease Other   . Breast cancer Maternal Aunt   . Prostate cancer Father   . Gastric cancer Mother   . Kidney cancer Neg Hx    Family Psychiatric  History: None Social History:  Social History   Substance and Sexual Activity  Alcohol Use Not Currently  . Alcohol/week: 1.0 standard drinks  . Types: 1 Glasses of wine per week   Comment: Occassionaly     Social History   Substance and Sexual Activity  Drug Use No    Social History   Socioeconomic History  . Marital status: Married    Spouse name: Not on file  . Number of children: Not on file  . Years of education: Not on file  . Highest education level: Not on file  Occupational History  . Not on file  Social Needs  . Financial resource strain: Patient refused  . Food insecurity:    Worry: Patient refused    Inability: Patient refused  . Transportation needs:    Medical: Patient refused    Non-medical: Patient refused  Tobacco Use  . Smoking status: Former Smoker    Packs/day: 1.00    Years: 18.00    Pack years: 18.00    Types: Cigarettes    Last attempt to quit: 2013    Years since quitting: 6.6  . Smokeless tobacco: Never Used  Substance and Sexual Activity  . Alcohol use: Not Currently    Alcohol/week: 1.0 standard drinks    Types: 1 Glasses of wine per week    Comment: Occassionaly  . Drug use: No  . Sexual activity: Not Currently  Lifestyle  . Physical activity:    Days per week: Patient refused    Minutes per session: Patient refused  . Stress: Patient refused  Relationships  . Social connections:    Talks on phone: Patient refused    Gets together: Patient refused    Attends religious service: Patient refused    Active member of club or organization: Patient refused    Attends meetings of clubs or organizations: Patient refused    Relationship status: Patient refused  Other Topics Concern  . Not on file  Social History Narrative   Lives at home with her husband.  Ambulates with a cane at  baseline.  Otherwise independent   Additional Social History:    Allergies:   Allergies  Allergen Reactions  . Hydrochlorothiazide Other (See Comments)    Elevation of creatinine Patient doesn't remember what reaction she had to it.    Labs:  Results for orders placed or performed during the hospital encounter of 01/14/18 (from the past 48 hour(s))  Comprehensive metabolic panel     Status: Abnormal   Collection Time: 01/15/18  4:26 AM  Result Value Ref Range   Sodium 140 135 - 145 mmol/L   Potassium 3.2 (L) 3.5 - 5.1 mmol/L   Chloride 107 98 - 111 mmol/L   CO2 25 22 - 32 mmol/L   Glucose, Bld  92 70 - 99 mg/dL   BUN 14 8 - 23 mg/dL   Creatinine, Ser 1.45 (H) 0.44 - 1.00 mg/dL   Calcium 8.8 (L) 8.9 - 10.3 mg/dL   Total Protein 5.9 (L) 6.5 - 8.1 g/dL   Albumin 3.1 (L) 3.5 - 5.0 g/dL   AST 37 15 - 41 U/L   ALT 23 0 - 44 U/L   Alkaline Phosphatase 179 (H) 38 - 126 U/L   Total Bilirubin 2.1 (H) 0.3 - 1.2 mg/dL   GFR calc non Af Amer 34 (L) >60 mL/min   GFR calc Af Amer 39 (L) >60 mL/min    Comment: (NOTE) The eGFR has been calculated using the CKD EPI equation. This calculation has not been validated in all clinical situations. eGFR's persistently <60 mL/min signify possible Chronic Kidney Disease.    Anion gap 8 5 - 15    Comment: Performed at [facility], [address] [place], [place] [address]  CBC     Status: Abnormal   Collection Time: 01/15/18  4:26 AM  Result Value Ref Range   WBC 3.1 (L) 3.6 - 11.0 K/uL   RBC 3.52 (L) 3.80 - 5.20 MIL/uL   Hemoglobin 11.0 (L) 12.0 - 16.0 g/dL   HCT 32.4 (L) 35.0 - 47.0 %   MCV 92.1 80.0 - 100.0 fL   MCH 31.2 26.0 - 34.0 pg   MCHC 33.8 32.0 - 36.0 g/dL   RDW 20.7 (H) 11.5 - 14.5 %   Platelets 46 (L) 150 - 440 K/uL    Comment: Performed at [facility], [address] [place], [place] [address]  Magnesium     Status: None   Collection Time: 01/15/18  7:58 AM  Result Value Ref Range   Magnesium 1.7 1.7  - 2.4 mg/dL    Comment: Performed at [facility], [address] [place], [place] [address]  Ammonia     Status: None   Collection Time: 01/15/18  7:58 AM  Result Value Ref Range   Ammonia 16 9 - 35 umol/L    Comment: Performed at [facility], [address] [place], [place] [address]  RPR     Status: None   Collection Time: 01/15/18  7:58 AM  Result Value Ref Range   RPR Ser Ql Non Reactive Non Reactive    Comment: (NOTE) Performed At: [facility] [place], [place] [id number] Rush Farmer MD [id number]   Basic metabolic panel     Status: Abnormal   Collection Time: 01/16/18  8:17 AM  Result Value Ref Range   Sodium 138 135 - 145 mmol/L   Potassium 3.9 3.5 - 5.1 mmol/L   Chloride 110 98 - 111 mmol/L   CO2 21 (L) 22 - 32 mmol/L   Glucose, Bld 126 (H) 70 - 99 mg/dL   BUN 15 8 - 23 mg/dL   Creatinine, Ser 1.25 (H) 0.44 - 1.00 mg/dL   Calcium 8.4 (L) 8.9 - 10.3 mg/dL   GFR calc non Af Amer 40 (L) >60 mL/min   GFR calc Af Amer 47 (L) >60 mL/min    Comment: (NOTE) The eGFR has been calculated using the CKD EPI equation. This calculation has not been validated in all clinical situations. eGFR's persistently <60 mL/min signify possible Chronic Kidney Disease.    Anion gap 7 5 - 15    Comment: Performed at [facility], [address] [place], [place] [address]  Current Facility-Administered Medications  Medication Dose Route Frequency Provider Last Rate Last Dose  . 0.9 % NaCl with KCl 20 mEq/ L  infusion   Intravenous Continuous Salary, Montell D, MD 50 mL/hr at 01/16/18 [phone]    . acetaminophen (TYLENOL) tablet 650 mg  650 mg Oral Q6H PRN Nicholes Mango, MD   650 mg at 01/16/18 0620   Or  . acetaminophen (TYLENOL) suppository 650 mg  650 mg Rectal Q6H PRN Gouru, Aruna, MD      . albuterol (PROVENTIL) (2.5 MG/3ML) 0.083% nebulizer solution 2.5 mg  2.5 mg Inhalation Q6H PRN Gouru, Aruna, MD      . amLODipine (NORVASC)  tablet 10 mg  10 mg Oral Daily Salary, Montell D, MD   10 mg at 01/16/18 1427  . amoxicillin-clavulanate (AUGMENTIN) 500-125 MG per tablet 500 mg  1 tablet Oral BID Loney Hering D, MD   500 mg at 01/16/18 0952  . antiseptic oral rinse (BIOTENE) solution 15 mL  15 mL Mouth Rinse PRN Gouru, Aruna, MD      . benazepril (LOTENSIN) tablet 20 mg  20 mg Oral Daily Salary, Montell D, MD   20 mg at 01/16/18 0952  . carvedilol (COREG) tablet 12.5 mg  12.5 mg Oral BID WC Salary, Montell D, MD   12.5 mg at 01/16/18 1645  . docusate sodium (COLACE) capsule 100 mg  100 mg Oral BID Gouru, Aruna, MD   100 mg at 01/16/18 0952  . labetalol (NORMODYNE,TRANDATE) injection 10 mg  10 mg Intravenous Q2H PRN Gouru, Aruna, MD   10 mg at 01/15/18 2027  . megestrol (MEGACE) 400 MG/10ML suspension 400 mg  400 mg Oral BID Salary, Holly Bodily D, MD   400 mg at 01/16/18 1427  . mirtazapine (REMERON) tablet 15 mg  15 mg Oral QHS Gouru, Illene Silver, MD   15 mg at 01/15/18 2004  . [START ON 01/17/2018] multivitamin with minerals tablet 1 tablet  1 tablet Oral Daily Salary, Montell D, MD      . ondansetron (ZOFRAN) tablet 4 mg  4 mg Oral Q6H PRN Gouru, Aruna, MD       Or  . ondansetron (ZOFRAN) injection 4 mg  4 mg Intravenous Q6H PRN Gouru, Aruna, MD      . triamcinolone cream (KENALOG) 0.1 % 1 application  1 application Topical BID Gouru, Aruna, MD   1 application at 17/91/50 2008  . vitamin B-12 (CYANOCOBALAMIN) tablet 1,000 mcg  1,000 mcg Oral Daily Gouru, Aruna, MD   1,000 mcg at 01/16/18 5697   Facility-Administered Medications Ordered in Other Encounters  Medication Dose Route Frequency Provider Last Rate Last Dose  . heparin lock flush 100 unit/mL  500 Units Intravenous Once Lloyd Huger, MD      . sodium chloride 0.9 % injection 10 mL  10 mL Intravenous PRN Lloyd Huger, MD   10 mL at 02/09/15 0844    Musculoskeletal: Strength & Muscle Tone: within normal limits Gait & Station: unsteady Patient leans:  N/A  Psychiatric Specialty Exam: Physical Exam  Nursing note and vitals reviewed. Constitutional: She appears well-developed.  HENT:  Head: Normocephalic and atraumatic.  Eyes: Pupils are equal, round, and reactive to light. Conjunctivae are normal.  Neck: Normal range of motion.  Cardiovascular: Regular rhythm and normal heart sounds.  Respiratory: Effort normal. No respiratory distress.  GI: Soft.  Musculoskeletal: Normal range of motion.  Neurological: She is alert.  Skin: Skin is warm and dry.  Psychiatric: She has  a normal mood and affect. Judgment normal. Her speech is delayed. She is slowed. Thought content is not paranoid and not delusional. She expresses no homicidal and no suicidal ideation. She exhibits abnormal recent memory.    Review of Systems  Constitutional: Negative.   HENT: Negative.   Eyes: Negative.   Respiratory: Negative.   Cardiovascular: Negative.   Gastrointestinal: Negative.   Musculoskeletal: Negative.   Skin: Negative.   Neurological: Negative.   Psychiatric/Behavioral: Positive for memory loss. Negative for depression, hallucinations, substance abuse and suicidal ideas. The patient is not nervous/anxious and does not have insomnia.     Blood pressure (!) 163/76, pulse 85, temperature 98.6 F (37 C), temperature source Oral, resp. rate 20, height 5' 4" (1.626 m), weight 58.2 kg, SpO2 100 %.Body mass index is 22.04 kg/m.  General Appearance: Casual  Eye Contact:  Good  Speech:  Slow  Volume:  Decreased  Mood:  Euthymic  Affect:  Constricted  Thought Process:  Goal Directed  Orientation:  Negative  Thought Content:  Rumination  Suicidal Thoughts:  No  Homicidal Thoughts:  No  Memory:  Immediate;   Fair Recent;   Poor Remote;   Fair  Judgement:  Fair  Insight:  Fair  Psychomotor Activity:  Decreased  Concentration:  Concentration: Fair  Recall:  Poor  Fund of Knowledge:  Good  Language:  Fair  Akathisia:  No  Handed:  Right  AIMS (if  indicated):     Assets:  Desire for Improvement Housing Social Support  ADL's:  Impaired  Cognition:  Impaired,  Mild  Sleep:        Treatment Plan Summary: Medication management and Plan 77 year old woman with multiple medical problems who appears to have a mild degree of dementia.  Unclear reasons why she would get delirious in the hospital.  To my examination this afternoon appears to be clearing up.  Was not delirious and was able to hold a lucid conversation and was actually more knowledgeable about her medical problems and a lot of patients I see.  I agree with what neurology had said previously about minimizing benzodiazepines and using low-dose antipsychotics if absolutely necessary for any confusion or agitation.  No other specific follow-up for any psychiatric needs at this point.  Disposition: No evidence of imminent risk to self or others at present.   Patient does not meet criteria for psychiatric inpatient admission. Supportive therapy provided about ongoing stressors.  Alethia Berthold, MD 01/16/2018 7:29 PM

## 2018-01-16 NOTE — Progress Notes (Addendum)
Subjective: Patient is alert and oriented today sitting up in the bed eating. Family at bedside. She is able to provide some history of her presenting illness. Family states that he had severe episodes of hallucination and confusion on 01/14/2018 that seem to have improved. She had report of confusion and hallucination last night per nursing report, Risperdal was administered with improvement.   Objective: Current vital signs: BP (!) 175/90   Pulse 87   Temp 97.9 F (36.6 C) (Oral)   Resp 17   Ht 5\' 4"  (1.626 m)   Wt 58.2 kg   SpO2 100%   BMI 22.04 kg/m  Vital signs in last 24 hours: Temp:  [97.7 F (36.5 C)-97.9 F (36.6 C)] 97.9 F (36.6 C) (08/20 0958) Pulse Rate:  [45-87] 87 (08/20 0958) Resp:  [15-17] 17 (08/20 0405) BP: (156-193)/(84-99) 175/90 (08/20 0958) SpO2:  [92 %-100 %] 100 % (08/20 0958)  Intake/Output from previous day: 08/19 0701 - 08/20 0700 In: 1505.4 [P.O.:340; I.V.:1165.4] Out: -  Intake/Output this shift: No intake/output data recorded. Nutritional status:  Diet Order            DIET - DYS 1 Room service appropriate? Yes with Assist; Fluid consistency: Nectar Thick  Diet effective now             Physical Exam   Vitals Blood pressure (!) 175/90, pulse 87, temperature 97.9 F (36.6 C), temperature source Oral, resp. rate 17, height 5\' 4"  (1.626 m), weight 58.2 kg, SpO2 100 %.  General Exam . Patient looks appropriate of age, well built, nourished and appropriately groomed.  . Cardiovascular Exam: S1, S2 heart sounds present  . Carotid exam revealed no bruit  . Lung exam was clear to auscultation  .  Neurological Exam . Alert,  . Oriented to place, person  . Able to follow two steps commands without difficulty . Able to identify family in the room by name and relationship to her . Attention span and concentration seemed appropriate  . Language seemed intact (naming, spontaneous speech, comprehension)  . I. Olfactory not examined . II:  Visual fields were full. Pupils were equal, round and reactive to light and accommodation . III,IV, VI: ptosis not present, extra-ocular motions intact bilaterally . V,VII: smile symmetric, facial light touch sensation normal bilaterally . VIII: Finger rub was heard symmetric in both ears . IX, X: Palate and uvular movements are normal and oral sensations are OK, gag reflex deffered . XI: Neck muscle strength and shoulder shrug is normal . XII: midline tongue extension . Tone is normal in all extremities, no abnormal movements seen  . Muscle strength in all extremities seemed normal.  . Deep tendon reflexes were symmetric  . Sensations were intact to light touch in all extremities  . Gait and station deffered  Lab Results: Basic Metabolic Panel: Recent Labs  Lab 01/14/18 1723 01/15/18 0426 01/15/18 0758 01/16/18 0817  NA 140 140  --  138  K 3.2* 3.2*  --  3.9  CL 105 107  --  110  CO2 24 25  --  21*  GLUCOSE 102* 92  --  126*  BUN 16 14  --  15  CREATININE 1.61* 1.45*  --  1.25*  CALCIUM 10.0 8.8*  --  8.4*  MG  --   --  1.7  --     Liver Function Tests: Recent Labs  Lab 01/14/18 1723 01/15/18 0426  AST 43* 37  ALT 25 23  ALKPHOS 205*  179*  BILITOT 2.1* 2.1*  PROT 6.5 5.9*  ALBUMIN 3.6 3.1*   No results for input(s): LIPASE, AMYLASE in the last 168 hours. Recent Labs  Lab 01/15/18 0758  AMMONIA 16    CBC: Recent Labs  Lab 01/14/18 1723 01/15/18 0426  WBC 3.8 3.1*  NEUTROABS 0.6*  --   HGB 11.7* 11.0*  HCT 34.1* 32.4*  MCV 91.7 92.1  PLT 57* 46*    Cardiac Enzymes: Recent Labs  Lab 01/14/18 1723  TROPONINI 0.06*    Lipid Panel: No results for input(s): CHOL, TRIG, HDL, CHOLHDL, VLDL, LDLCALC in the last 168 hours.  CBG: No results for input(s): GLUCAP in the last 168 hours.  Microbiology: Results for orders placed or performed during the hospital encounter of 01/14/18  Culture, blood (Routine x 2)     Status: None (Preliminary result)    Collection Time: 01/14/18  5:27 PM  Result Value Ref Range Status   Specimen Description BLOOD L WRIST  Final   Special Requests   Final    BOTTLES DRAWN AEROBIC AND ANAEROBIC Blood Culture adequate volume   Culture   Final    NO GROWTH 2 DAYS Performed at [facility], [address], [place], [place] [address]    Report Status PENDING  Incomplete  Culture, blood (Routine x 2)     Status: None (Preliminary result)   Collection Time: 01/14/18  5:27 PM  Result Value Ref Range Status   Specimen Description BLOOD RAC  Final   Special Requests   Final    BOTTLES DRAWN AEROBIC AND ANAEROBIC Blood Culture adequate volume   Culture   Final    NO GROWTH 2 DAYS Performed at [facility], [address], [place], [place] [address]    Report Status PENDING  Incomplete  Urine Culture     Status: None (Preliminary result)   Collection Time: 01/14/18  5:27 PM  Result Value Ref Range Status   Specimen Description   Final    URINE, RANDOM Performed at [facility], [address], [place], [place] [address]    Special Requests   Final    NONE Performed at [facility], [address] [place], [place] [address]    Culture   Final    CULTURE REINCUBATED FOR BETTER GROWTH Performed at [facility], [place] [address], [place],  [address]    Report Status PENDING  Incomplete    Coagulation Studies: Recent Labs    01/14/18 1723  LABPROT 14.5  INR 1.14    Imaging: Dg Chest 2 View  Result Date: 01/14/2018 CLINICAL DATA:  Weakness. Abdominal pain. History of COPD, lung cancer, non-Hodgkin's lymphoma. EXAM: CHEST - 2 VIEW COMPARISON:  CT chest January 05, 2018 and chest radiograph January 03, 2018. FINDINGS: Stable cardiomegaly. Mediastinal silhouette is not suspicious. Calcified aortic arch. LEFT mid lung zone bandlike density. Chronic interstitial changes without pleural effusion. Patchy RIGHT mid and lower lung zone airspace opacities.  Biapical bullous changes. No pneumothorax. Mild degenerative changes thoracic spine. Osteopenia. IMPRESSION: 1. Stable cardiomegaly. 2. COPD with LEFT mid lung zone scarring. Similar patchy RIGHT mid and lower lung zone airspace opacities. Aortic Atherosclerosis (ICD10-I70.0). Electronically Signed   By: Elon Alas M.D.   On: 01/14/2018 18:57   Dg Lumbar Spine Complete  Result Date: 01/14/2018 CLINICAL DATA:  Back pain after fall EXAM: LUMBAR SPINE - COMPLETE 4+ VIEW COMPARISON:  CT 10/07/2015 FINDINGS: Lumbar alignment within normal limits. Moderate degenerative  change at L5-S1. Mild degenerative changes elsewhere in the spine. Vertebral body heights are grossly maintained. Heavily calcified aorta. IMPRESSION: Mild to moderate multilevel degenerative changes. No definite acute osseous abnormality. Electronically Signed   By: Donavan Foil M.D.   On: 01/14/2018 20:40   Dg Pelvis 1-2 Views  Result Date: 01/14/2018 CLINICAL DATA:  Low back pain after fall EXAM: PELVIS - 1-2 VIEW COMPARISON:  CT 10/07/2015 FINDINGS: SI joint are non widened. Pubic symphysis and rami are intact. Both femoral heads project in joint. Possible step-off deformity at the right subcapital femur. IMPRESSION: Possible subcapital step-off deformity involving proximal right femur. Suggest CT for further evaluation. Electronically Signed   By: Donavan Foil M.D.   On: 01/14/2018 20:37   Ct Head Wo Contrast  Result Date: 01/14/2018 CLINICAL DATA:  Fall with unknown LOC EXAM: CT HEAD WITHOUT CONTRAST TECHNIQUE: Contiguous axial images were obtained from the base of the skull through the vertex without intravenous contrast. COMPARISON:  MRI 01/06/2018, CT brain 01/05/2018 FINDINGS: Brain: No acute territorial infarction, hemorrhage or intracranial mass. The ventricles are of normal size. Mild atrophy. Minimal small vessel ischemic changes of the white matter. Vascular: No hyperdense vessels.  Carotid vascular calcification. Skull:  Normal. Negative for fracture or focal lesion. Sinuses/Orbits: No acute finding. Other: None IMPRESSION: No CT evidence for acute intracranial abnormality. Atrophy and minimal small vessel ischemic change of the white matter Electronically Signed   By: Donavan Foil M.D.   On: 01/14/2018 18:34   Ct Hip Right Wo Contrast  Result Date: 01/14/2018 CLINICAL DATA:  Possible right hip fracture. EXAM: CT OF THE RIGHT HIP WITHOUT CONTRAST TECHNIQUE: Multidetector CT imaging of the right hip was performed according to the standard protocol. Multiplanar CT image reconstructions were also generated. COMPARISON:  Pelvis radiograph 01/14/2018 FINDINGS: Bones/Joint/Cartilage There is no evidence of fracture. Ligaments Suboptimally assessed by CT. Muscles and Tendons Normal. Soft tissues Normal. IMPRESSION: No evidence of hip fracture. Electronically Signed   By: Fidela Salisbury M.D.   On: 01/14/2018 23:11    Medications:  I have reviewed the patient's current medications. Scheduled: . amLODipine  10 mg Oral Daily  . amoxicillin-clavulanate  1 tablet Oral BID  . benazepril  20 mg Oral Daily  . carvedilol  12.5 mg Oral BID WC  . docusate sodium  100 mg Oral BID  . megestrol  400 mg Oral BID  . mirtazapine  15 mg Oral QHS  . [START ON 01/17/2018] multivitamin with minerals  1 tablet Oral Daily  . triamcinolone cream  1 application Topical BID  . vitamin B-12  1,000 mcg Oral Daily    Assessment: 77 y.o. female present with episode of confusion and slurred speech initial concerns for TIA vs seizure with negative imaging. She seem stable now with improvement in mental status.  Had episodes of intermittent confusion with hallucinations now improved with administration of Seroquel and Risperdal. Behavioral disturbances likely delirium in the setting of infection. On antiplatelet therapy due to low platelets. Oncology following for lung cancer and Hodgkin's lymphoma. Hypercalcemia now improving.  Plan -  Continue antipsychotics for management of behavioral disturbances. Recommend to avoid benzodiazepines due to concerns of paradoxical reaction and worsening of neuropsychiatrist symptoms. - Will continue to follow as needed a there is no focal neurological abnormality to warrant further testing at this time.   This patient was staffed with Dr. Irish Elders, Alease Frame who personally evaluated patient, reviewed documentation and agreed with assessment and plan of care as above.  Rufina Falco, DNP, FNP-BC   Board certified Nurse Practitioner Neurology Department   Pt was evaluated. Much improved Withhold from benzo use. Atypical antipsychotic if needed Call with questions.  Modene Andy    LOS: 2 days   01/16/2018  1:06 PM

## 2018-01-16 NOTE — Progress Notes (Signed)
[facility] [place] NAME: Marie Lowe    MR#:  [id number]  DATE OF BIRTH:  06/15/1940  SUBJECTIVE:  CHIEF COMPLAINT:   Chief Complaint  Patient presents with  . Altered Mental Status  Patient remains intimately confused and disoriented, neurology and psychiatry consult if expert opinion, discussed case with speech therapy-patient will need 100% observation during meals/assistance with meals/pured with nectar thickened, consult palliative care, continued poor appetite-start Megace  REVIEW OF SYSTEMS:  CONSTITUTIONAL: No fever, fatigue or weakness.  EYES: No blurred or double vision.  EARS, NOSE, AND THROAT: No tinnitus or ear pain.  RESPIRATORY: No cough, shortness of breath, wheezing or hemoptysis.  CARDIOVASCULAR: No chest pain, orthopnea, edema.  GASTROINTESTINAL: No nausea, vomiting, diarrhea or abdominal pain.  GENITOURINARY: No dysuria, hematuria.  ENDOCRINE: No polyuria, nocturia,  HEMATOLOGY: No anemia, easy bruising or bleeding SKIN: No rash or lesion. MUSCULOSKELETAL: No joint pain or arthritis.   NEUROLOGIC: No tingling, numbness, weakness.  PSYCHIATRY: No anxiety or depression.   ROS  DRUG ALLERGIES:   Allergies  Allergen Reactions  . Hydrochlorothiazide Other (See Comments)    Elevation of creatinine Patient doesn't remember what reaction she had to it.    VITALS:  Blood pressure (!) 175/90, pulse 87, temperature 97.9 F (36.6 C), temperature source Oral, resp. rate 17, height 5\' 4"  (1.626 m), weight 58.2 kg, SpO2 100 %.  PHYSICAL EXAMINATION:  GENERAL:  77 y.o.-year-old patient lying in the bed with no acute distress.  EYES: Pupils equal, round, reactive to light and accommodation. No scleral icterus. Extraocular muscles intact.  HEENT: Head atraumatic, normocephalic. Oropharynx and nasopharynx clear.  NECK:  Supple, no jugular venous distention. No thyroid enlargement, no tenderness.  LUNGS: Normal breath  sounds bilaterally, no wheezing, rales,rhonchi or crepitation. No use of accessory muscles of respiration.  CARDIOVASCULAR: S1, S2 normal. No murmurs, rubs, or gallops.  ABDOMEN: Soft, nontender, nondistended. Bowel sounds present. No organomegaly or mass.  EXTREMITIES: No pedal edema, cyanosis, or clubbing.  NEUROLOGIC: Cranial nerves II through XII are intact. Muscle strength 5/5 in all extremities. Sensation intact. Gait not checked.  PSYCHIATRIC: The patient is alert and oriented x 3.  SKIN: No obvious rash, lesion, or ulcer.   Physical Exam LABORATORY PANEL:   CBC Recent Labs  Lab 01/15/18 0426  WBC 3.1*  HGB 11.0*  HCT 32.4*  PLT 46*   ------------------------------------------------------------------------------------------------------------------  Chemistries  Recent Labs  Lab 01/15/18 0426 01/15/18 0758 01/16/18 0817  NA 140  --  138  K 3.2*  --  3.9  CL 107  --  110  CO2 25  --  21*  GLUCOSE 92  --  126*  BUN 14  --  15  CREATININE 1.45*  --  1.25*  CALCIUM 8.8*  --  8.4*  MG  --  1.7  --   AST 37  --   --   ALT 23  --   --   ALKPHOS 179*  --   --   BILITOT 2.1*  --   --    ------------------------------------------------------------------------------------------------------------------  Cardiac Enzymes Recent Labs  Lab 01/14/18 1723  TROPONINI 0.06*   ------------------------------------------------------------------------------------------------------------------  RADIOLOGY:  Dg Chest 2 View  Result Date: 01/14/2018 CLINICAL DATA:  Weakness. Abdominal pain. History of COPD, lung cancer, non-Hodgkin's lymphoma. EXAM: CHEST - 2 VIEW COMPARISON:  CT chest January 05, 2018 and chest radiograph January 03, 2018. FINDINGS: Stable cardiomegaly. Mediastinal silhouette is not suspicious. Calcified aortic arch.  LEFT mid lung zone bandlike density. Chronic interstitial changes without pleural effusion. Patchy RIGHT mid and lower lung zone airspace opacities.  Biapical bullous changes. No pneumothorax. Mild degenerative changes thoracic spine. Osteopenia. IMPRESSION: 1. Stable cardiomegaly. 2. COPD with LEFT mid lung zone scarring. Similar patchy RIGHT mid and lower lung zone airspace opacities. Aortic Atherosclerosis (ICD10-I70.0). Electronically Signed   By: Elon Alas M.D.   On: 01/14/2018 18:57   Dg Lumbar Spine Complete  Result Date: 01/14/2018 CLINICAL DATA:  Back pain after fall EXAM: LUMBAR SPINE - COMPLETE 4+ VIEW COMPARISON:  CT 10/07/2015 FINDINGS: Lumbar alignment within normal limits. Moderate degenerative change at L5-S1. Mild degenerative changes elsewhere in the spine. Vertebral body heights are grossly maintained. Heavily calcified aorta. IMPRESSION: Mild to moderate multilevel degenerative changes. No definite acute osseous abnormality. Electronically Signed   By: Donavan Foil M.D.   On: 01/14/2018 20:40   Dg Pelvis 1-2 Views  Result Date: 01/14/2018 CLINICAL DATA:  Low back pain after fall EXAM: PELVIS - 1-2 VIEW COMPARISON:  CT 10/07/2015 FINDINGS: SI joint are non widened. Pubic symphysis and rami are intact. Both femoral heads project in joint. Possible step-off deformity at the right subcapital femur. IMPRESSION: Possible subcapital step-off deformity involving proximal right femur. Suggest CT for further evaluation. Electronically Signed   By: Donavan Foil M.D.   On: 01/14/2018 20:37   Ct Head Wo Contrast  Result Date: 01/14/2018 CLINICAL DATA:  Fall with unknown LOC EXAM: CT HEAD WITHOUT CONTRAST TECHNIQUE: Contiguous axial images were obtained from the base of the skull through the vertex without intravenous contrast. COMPARISON:  MRI 01/06/2018, CT brain 01/05/2018 FINDINGS: Brain: No acute territorial infarction, hemorrhage or intracranial mass. The ventricles are of normal size. Mild atrophy. Minimal small vessel ischemic changes of the white matter. Vascular: No hyperdense vessels.  Carotid vascular calcification. Skull:  Normal. Negative for fracture or focal lesion. Sinuses/Orbits: No acute finding. Other: None IMPRESSION: No CT evidence for acute intracranial abnormality. Atrophy and minimal small vessel ischemic change of the white matter Electronically Signed   By: Donavan Foil M.D.   On: 01/14/2018 18:34   Ct Hip Right Wo Contrast  Result Date: 01/14/2018 CLINICAL DATA:  Possible right hip fracture. EXAM: CT OF THE RIGHT HIP WITHOUT CONTRAST TECHNIQUE: Multidetector CT imaging of the right hip was performed according to the standard protocol. Multiplanar CT image reconstructions were also generated. COMPARISON:  Pelvis radiograph 01/14/2018 FINDINGS: Bones/Joint/Cartilage There is no evidence of fracture. Ligaments Suboptimally assessed by CT. Muscles and Tendons Normal. Soft tissues Normal. IMPRESSION: No evidence of hip fracture. Electronically Signed   By: Fidela Salisbury M.D.   On: 01/14/2018 23:11    ASSESSMENT AND PLAN:  VerlaJeffriesis a77 y.o.femalewith a known history of COPD, Hodgkin's lymphoma, lung cancer, seeing Dr. Grayland Ormond as an outpatient, hypertension, chronic kidney disease, GERD, brought into the ED for worsening AMS, recent dicharge for hyperkalemia/CAP,CT head is negative,chest x-ray reveals Similar patchy RIGHT mid and lower lung zone airspace opacities  *Acute encephalopathy  Noted visual hallucinations during her hospital stay Suspect due to worsening organic brain disease versus undiagnosed psychiatric illness  MRI of the brain done January 06, 2018 was negative for any acute process  *Acute pneumonia  Augmentin to complete antibiotic course   *Acute kidney injury  Secondary to poor p.o. intake  Encourage p.o. fluids, will need assistance with all meals and discussion with speech therapy  Avoid nephrotoxic agents  *Acute hallucinations ?  Etiology  Psychiatry to see  *  Acute hypertensive urgency Resolving Add Norvasc, IV hydralazine as needed, vitals per  routine, and make changes as per necessary  *Acute on chronic dysphagia Speech therapy input appreciated-we will need 100% observation during mealtime/will need assistance with all meals/pured nectar thickened liquid for now, continue aspirations precautions   *Hypokalemia Repleted  *Chronic COPD w/o exacerbation BTs  *History of lung cancer and Hodgkin's lymphoma Outpatient follow-up with Dr. Grayland Ormond s/p discharge  *Thrombocytopenia no active bleeding or bruising Conservative monitoring  DVT prophylaxis-SCDs as the patient has thrombocytopenia Disposition to skilled nursing facility on tomorrow barring any complications and discussion with social work  Long-term prognosis is poor-palliative care consulted  All the records are reviewed and case discussed with Care Management/Social Workerr. Management plans discussed with the patient, family and they are in agreement.  CODE STATUS: dnr  TOTAL TIME TAKING CARE OF THIS PATIENT: 45 minutes.   POSSIBLE D/C IN 1 DAYS, DEPENDING ON CLINICAL CONDITION.   Avel Peace Kaulder Zahner M.D on 01/16/2018   Between 7am to 6pm - Pager - [phone]  After 6pm go to [email or web address] - password [email or web address] [facility]  Office  [phone]  CC: Primary care physician; Marinda Elk, MD  Note: This dictation was prepared with Dragon dictation along with smaller phrase technology. Any transcriptional errors that result from this process are unintentional.

## 2018-01-16 NOTE — Clinical Social Work Note (Signed)
Clinical Social Work Assessment  Patient Details  Name: Marie Lowe MRN: [id number] Date of Birth: 11-Feb-1941  Date of referral:  01/16/18               Reason for consult:  Facility Placement                Permission sought to share information with:  Case Manager Permission granted to share information::  Yes, Verbal Permission Granted  Name::      SNF  Agency::   [facility]   Relationship::     Contact Information:     Housing/Transportation Living arrangements for the past 2 months:  [place] Information:  Adult Children Patient Interpreter Needed:  None Criminal Activity/Legal Involvement Pertinent to Current Situation/Hospitalization:  No - Comment as needed Significant Relationships:  Adult Children, Spouse, Other Family Members, [place] Lives with:  Significant Other Do you feel safe going back to the place where you live?  Yes Need for family participation in patient care:  Yes (Comment)  Care giving concerns:  Patient lives with her spouse in [place] and is the primary caregiver for him.    Social Worker assessment / plan:  CSW consulted for SNF placement. CSW attempted to speak with patient but she is confused. CSW spoke with patient's daughter Marie Lowe [phone]. CSW introduced self and explained role. Daughter states that patient has been living with her husband and is the primary caregiver for her husband who has Dementia. CSW explained therapy recommendation of SNF for rehab. Daughter is in agreement and would like patient to go to [facility].   CSW did bed search and [facility] is able to offer a bed. CSW accepted bed for patient and will continue to follow for discharge planning.   Employment status:  Retired Forensic scientist:  [employer] PT Recommendations:  [place] / Referral to community resources:  [place]  Patient/Family's Response to care:  Daughter thanked CSW for  assistance   Patient/Family's Understanding of and Emotional Response to Diagnosis, Current Treatment, and Prognosis:  Daughter states she understands why patient needs SNF and is accepting of discharge plan   Emotional Assessment Appearance:  Appears stated age Attitude/Demeanor/Rapport:    Affect (typically observed):  Accepting, Pleasant Orientation:  Oriented to Self, Oriented to Place Alcohol / Substance use:  Not Applicable Psych involvement (Current and /or in the community):  No (Comment)  Discharge Needs  Concerns to be addressed:  Discharge Planning Concerns Readmission within the last 30 days:  No Current discharge risk:  None Barriers to Discharge:  Continued Medical Work up   [employer], [place] 01/16/2018, 2:43 PM

## 2018-01-16 NOTE — Progress Notes (Signed)
Speech Language Pathology Treatment: Dysphagia  Patient Details Name: Marie Lowe MRN: [id number] DOB: 27-Mar-1941 Today's Date: 01/16/2018 Time: [id number] SLP Time Calculation (min) (ACUTE ONLY): 30 min  Assessment / Plan / Recommendation Clinical Impression  Pt seen for ongoing assessment of toleration of diet; trials of Nectar liquids via cup and Purees, reduced textured soft solids. Pt was awake, engaged w/ SLP and MD in quite basic responses; easily distracted and required verbal cues to redirect to task. Pt required supervision w/ all po trials. Pt required min-mod support cleaning/placing dentures/partial.  Pt consumed trials given w/ no overt s/s of aspiration noted; clear vocal quality and no decline in respiratory status during po's. Oral phase deficits were noted c/b prolonged mastication/oral phase time w/ more of a munching pattern w/ trials of soft solids. Pt tended to munch on the boluses more anteriorly in mouth vs. using rotary chewing laterally. Given time, she was able to transfer boluses A-P then orally clearing post swallowing. She utilized Cabin crew x2 to fully clear. Educated on alternating foods/liquids to aid oral clearing as well. Further textured trials of solids were not assessed d/t requiring increased mastication effort. Pt required feeding support as she appeared weak and often missed her mouth w/ the spoon(below the chin).  Pt continues to present w/ oropharyngeal phase dysphagia suspect impacted by her declined Cognitive status. Recommend Dysphagia I diet with Nectar thick liquids VIA CUP with aspiration precautions, with Pills to be given Whole in Puree as tolerates, or Crushed as needed d/t Cognitive status. Recommend assistance at meals for feeding and monitoring. ST services will continue to f/u w/ toleration of diet and trials to upgrade diet as appropriate. This current dysphagia diet will allow for increased oral intake w/ less mastication effort  thus hopefully increasing overall intake for nutrition/hydration. MD/NSG updated and agreed.      HPI HPI: Pt is a 77 y.o. female with a known history of CKD, COPD, Hodgkin's lymphoma, lung cancer, seeing Dr. Grayland Ormond as an outpatient, hypertension, chronic kidney disease, GERD, erosive gastropathy, pneumonia and other medical problems is brought into the ED as patient has been altered in her mental status is getting worse.  Patient was just recently admitted to the hospital with hyperkalemia and a community-acquired pneumonia and discharged home with antibiotics - it was assessed that pt had oropharyngeal phase dysphagia and diet was to be on a dysphagia diet w/ Nectar consistency liquids. Patient seems to be more confused than her baseline and patient is brought into the emergency department by family.  CT head is negative , chest x-ray reveals Similar patchy RIGHT mid and lower lung zone airspace opacities.  Blood pressure is significantly elevated and patient seems to be dehydrated per MD assessment at admission.      SLP Plan  Continue with current plan of care       Recommendations  Diet recommendations: Dysphagia 1 (puree);Nectar-thick liquid Liquids provided via: Cup(monitor any straw use) Medication Administration: Whole meds with puree(or Crushed as able if indicated) Supervision: Patient able to self feed;Staff to assist with self feeding;Full supervision/cueing for compensatory strategies(d/t Cognitive decline) Compensations: Minimize environmental distractions;Slow rate;Small sips/bites;Follow solids with liquid Postural Changes and/or Swallow Maneuvers: Seated upright 90 degrees;Upright 30-60 min after meal                General recommendations: (Dietician f/u) Oral Care Recommendations: Oral care BID;Staff/trained caregiver to provide oral care Follow up Recommendations: Skilled Nursing facility SLP Visit Diagnosis: Dysphagia, oropharyngeal phase (R13.12) Plan:  Continue  with current plan of care       [place], MS, CCC-SLP Watson,Katherine 01/16/2018, 1:52 PM

## 2018-01-17 LAB — URINE CULTURE

## 2018-01-17 MED ORDER — AMLODIPINE BESYLATE 10 MG PO TABS: 10.0000 mg | ORAL_TABLET | Freq: Every day | ORAL | Status: AC

## 2018-01-17 MED ORDER — BENAZEPRIL HCL 20 MG PO TABS: 20.0000 mg | ORAL_TABLET | Freq: Every day | ORAL | Status: AC

## 2018-01-17 MED ORDER — AMOXICILLIN-POT CLAVULANATE 500-125 MG PO TABS: 1.0000 | ORAL_TABLET | Freq: Two times a day (BID) | ORAL | 0 refills | Status: AC

## 2018-01-17 MED ORDER — CARVEDILOL 12.5 MG PO TABS: 12.5000 mg | ORAL_TABLET | Freq: Two times a day (BID) | ORAL | Status: AC

## 2018-01-17 NOTE — Progress Notes (Signed)
Patient transported via EMS to [facility].  Daughter at bedside and aware.  Patient's glasses and dentures sent with her to facility.  Clarise Cruz, RN, BSN

## 2018-01-17 NOTE — Progress Notes (Signed)
Report called to Elmyra Ricks, RN at [facility] and EMS contacted for transport. Clarise Cruz, RN, BSN

## 2018-01-17 NOTE — Care Management Important Message (Signed)
Important Message  Patient Details  Name: Marie Lowe MRN: [id number] Date of Birth: 12-Jun-1940   Medicare Important Message Given:  Yes    [name] A Kingsten Enfield 01/17/2018, 9:04 AM

## 2018-01-17 NOTE — Discharge Summary (Signed)
[facility] NAME: Marie Lowe    MR#:  [id number]  DATE OF BIRTH:  [birth year]  DATE OF ADMISSION:  01/14/2018   ADMITTING PHYSICIAN: Nicholes Mango, MD  DATE OF DISCHARGE: 01/17/2018 PRIMARY CARE PHYSICIAN: Marinda Elk, MD   ADMISSION DIAGNOSIS:  Failure to thrive in adult [R62.7] Altered mental status, unspecified altered mental status type [R41.82] DISCHARGE DIAGNOSIS:  Principal Problem:   Subacute delirium Active Problems:   Acute encephalopathy   Protein-calorie malnutrition, severe   Dementia  SECONDARY DIAGNOSIS:   Past Medical History:  Diagnosis Date  . Anemia   . Chest pain, unspecified   . Marie Lowe   . Lowe (Marie obstructive pulmonary Lowe) ([place])   . Epistaxis   . Erosive gastropathy   . Marie Lowe (gastroesophageal reflux Lowe)   . High cholesterol   . Marie Lowe ([place]) 2014   chemo  . Marie Lowe ([place])   . Hypercalcemia   . Marie Lowe   . Marie Lowe ([place]) 2015  . Pneumonia   . Rendu-Osler-Weber Lowe ([place])   . Shortness of breath dyspnea    HOSPITAL COURSE:  Marie Lowe, Marie Lowe, Marie Lowe, Marie Lowe, Marie Lowe, Marie Lowe, Marie Lowe, Marie Lowe, Marie Lowe,CT head is negative,chest x-ray reveals Similar patchy RIGHT mid and lower Marie zone airspace opacities  *Acute encephalopathy Noted visual hallucinations during her hospital stay Suspect due to worsening organic brain Lowe versus undiagnosed psychiatric illness  MRI of the brain done January 06, 2018 was negative for any acute process  *Acute pneumonia  Augmentin 3 more days to complete antibiotic course   *Acute kidney injury on CKD stage 3. Secondary to poor p.o. intake  Encourage p.o. fluids, need assistance with all meals and  discussion with speech therapy  Avoid nephrotoxic agents. Improved.  *Acute hallucinations benzodiazepines and using low-dose antipsychotics if absolutely necessary for any confusion or agitation psych consult and urology consult.  *Acute hypertensive urgency Resolving Add Norvasc, IV hydralazine as needed, vitals per routine, and make changes as per necessary  *Acute on Marie dysphagia Speech therapy input appreciated-we will need 100% observation during mealtime/will need assistance with all meals/pured nectar thickened liquid for now, continue aspirations precautions   *Hypokalemia Repleted and improved.  *Marie COPDw/oexacerbation BTs  *History of Marie Lowe and Marie Lowe Lowe follow-up with Dr. Karis Juba discharge  *Thrombocytopenia no active bleeding or bruising Conservative monitoring  DISCHARGE CONDITIONS:  Stable, discharge to skilled nursing facility today. CONSULTS OBTAINED:  Treatment Team:  Leotis Pain, MD Clapacs, Madie Reno, MD DRUG ALLERGIES:   Allergies  Allergen Reactions  . Hydrochlorothiazide Other (See Comments)    Elevation of creatinine Patient doesn't remember what reaction she had to it.   DISCHARGE MEDICATIONS:   Allergies as of 01/17/2018      Reactions   Hydrochlorothiazide Other (See Comments)   Elevation of creatinine Patient doesn't remember what reaction she had to it.      Medication List    STOP taking these medications   azithromycin 500 MG tablet Commonly known as:  ZITHROMAX   cefdinir 300 MG capsule Commonly known as:  OMNICEF     TAKE these medications   albuterol 108 (90 Base) MCG/ACT inhaler Commonly known as:  PROVENTIL HFA;VENTOLIN HFA Inhale 2 puffs into the lungs every 6 (six) hours as needed for wheezing or shortness of  breath.   amLODipine 10 MG tablet Commonly known as:  NORVASC Take 1 tablet (10 mg total) by mouth daily.   amoxicillin-clavulanate 500-125 MG  tablet Commonly known as:  AUGMENTIN Take 1 tablet (500 mg total) by mouth 2 (two) times daily for 3 days.   antiseptic oral rinse Liqd 15 mLs by Mouth Rinse route as needed for dry mouth.   atorvastatin 40 MG tablet Commonly known as:  LIPITOR Take 1 tablet (40 mg total) by mouth daily.   benazepril 20 MG tablet Commonly known as:  LOTENSIN Take 1 tablet (20 mg total) by mouth daily. What changed:    medication strength  how much to take   calcium carbonate 1250 (500 Ca) MG tablet Commonly known as:  OS-CAL - dosed in mg of elemental calcium Take 1 tablet by mouth 2 (two) times daily with a meal.   carvedilol 12.5 MG tablet Commonly known as:  COREG Take 1 tablet (12.5 mg total) by mouth 2 (two) times daily with a meal. What changed:    medication strength  how much to take  when to take this   mirtazapine 15 MG tablet Commonly known as:  REMERON Take 15 mg by mouth at bedtime.   MULTIVITAMIN PO Take 1 tablet by mouth daily.   triamcinolone cream 0.1 % Commonly known as:  KENALOG Apply 1 application topically 2 (two) times daily.   vitamin B-12 1000 MCG tablet Commonly known as:  CYANOCOBALAMIN Take 1,000 mcg by mouth daily.        DISCHARGE INSTRUCTIONS:  See AVS. If you experience worsening of your admission symptoms, develop shortness of breath, life threatening emergency, suicidal or homicidal thoughts you must seek medical attention immediately by calling 911 or calling your MD immediately  if symptoms less severe.  You Must read complete instructions/literature along with all the possible adverse reactions/side effects for all the Medicines you take and that have been prescribed to you. Take any new Medicines after you have completely understood and accpet all the possible adverse reactions/side effects.   Please note  You were cared for by a hospitalist during your hospital stay. If you have any questions about your discharge medications or the  care you received while you were in the hospital after you are discharged, you can call the unit and asked to speak with the hospitalist on call if the hospitalist that took care of you is not available. Once you are discharged, your primary care physician will handle any further medical issues. Please note that NO REFILLS for any discharge medications will be authorized once you are discharged, as it is imperative that you return to your primary care physician (or establish a relationship with a primary care physician if you do not have one) for your aftercare needs so that they can reassess your need for medications and monitor your lab values.    On the day of Discharge:  VITAL SIGNS:  Blood pressure 138/75, pulse 88, temperature 98.5 F (36.9 C), temperature source Oral, resp. rate 18, height 5\' 4"  (1.626 m), weight 58.2 kg, SpO2 100 %. PHYSICAL EXAMINATION:  GENERAL:  77 y.o.-year-old patient lying in the bed with no acute distress.  EYES: Pupils equal, round, reactive to light and accommodation. No scleral icterus. Extraocular muscles intact.  HEENT: Head atraumatic, normocephalic. Oropharynx and nasopharynx clear.  NECK:  Supple, no jugular venous distention. No thyroid enlargement, no tenderness.  LUNGS: Normal breath sounds bilaterally, no wheezing, rales,rhonchi or crepitation. No use of  accessory muscles of respiration.  CARDIOVASCULAR: S1, S2 normal. No murmurs, rubs, or gallops.  ABDOMEN: Soft, non-tender, non-distended. Bowel sounds present. No organomegaly or mass.  EXTREMITIES: No pedal edema, cyanosis, or clubbing.  NEUROLOGIC: Cranial nerves II through XII are intact. Muscle strength 4/5 in all extremities. Sensation intact. Gait not checked.  PSYCHIATRIC: The patient is demented. SKIN: No obvious rash, lesion, or ulcer.  DATA REVIEW:   CBC Marie Labs  Lab 01/15/18 0426  WBC 3.1*  HGB 11.0*  HCT 32.4*  PLT 46*    Chemistries  Marie Labs  Lab 01/15/18 0426  01/15/18 0758 01/16/18 0817  NA 140  --  138  K 3.2*  --  3.9  CL 107  --  110  CO2 25  --  21*  GLUCOSE 92  --  126*  BUN 14  --  15  CREATININE 1.45*  --  1.25*  CALCIUM 8.8*  --  8.4*  MG  --  1.7  --   AST 37  --   --   ALT 23  --   --   ALKPHOS 179*  --   --   BILITOT 2.1*  --   --      Microbiology Results  Results for orders placed or performed during the hospital encounter of 01/14/18  Culture, blood (Routine x 2)     Status: None (Preliminary result)   Collection Time: 01/14/18  5:27 PM  Result Value Ref Range Status   Specimen Description BLOOD L WRIST  Final   Special Requests   Final    BOTTLES DRAWN AEROBIC AND ANAEROBIC Blood Culture adequate volume   Culture   Final    NO GROWTH 2 DAYS Performed at [facility], [address] [place], [place] [address]    Report Status PENDING  Incomplete  Culture, blood (Routine x 2)     Status: None (Preliminary result)   Collection Time: 01/14/18  5:27 PM  Result Value Ref Range Status   Specimen Description BLOOD RAC  Final   Special Requests   Final    BOTTLES DRAWN AEROBIC AND ANAEROBIC Blood Culture adequate volume   Culture   Final    NO GROWTH 2 DAYS Performed at [facility], [address], [place], [place] [address]    Report Status PENDING  Incomplete  Urine Culture     Status: Abnormal   Collection Time: 01/14/18  5:27 PM  Result Value Ref Range Status   Specimen Description   Final    URINE, RANDOM Performed at [facility], [address] [place], [place] [address]    Special Requests   Final    NONE Performed at [facility], [address] [place], [place] [address]    Culture MULTIPLE SPECIES PRESENT, SUGGEST RECOLLECTION (A)  Final   Report Status 01/17/2018 FINAL  Final    RADIOLOGY:  No results found.   Management plans discussed with the patient, family and they are in agreement.  CODE STATUS: DNR   TOTAL TIME TAKING CARE OF THIS  PATIENT: 35 minutes.    Demetrios Loll M.D on 01/17/2018 at 7:44 AM  Between 7am to 6pm - Pager - [phone]  After 6pm go to [email or web address] - Proofreader  [facility]  Office  [phone]  CC: Primary care physician; Marinda Elk, MD   Note: This dictation was prepared with Dragon dictation along with smaller phrase technology. Any transcriptional errors that  result from this process are unintentional.

## 2018-01-17 NOTE — Clinical Social Work Note (Signed)
Patient is medically ready for discharge to [facility] today. CSW notified patient and daughter Crawford Givens [phone] of discharge today. CSW also notified Otila Kluver, [facility] liaison of discharge today. Patient will be transported by EMS. RN will call report and call for EMS.   [place], [place]

## 2018-01-19 LAB — CULTURE, BLOOD (ROUTINE X 2)
Culture: NO GROWTH
Culture: NO GROWTH
SPECIAL REQUESTS: ADEQUATE
Special Requests: ADEQUATE

## 2018-01-26 ENCOUNTER — Encounter: Payer: Self-pay | Admitting: Oncology

## 2018-01-26 ENCOUNTER — Other Ambulatory Visit: Payer: Self-pay

## 2018-01-26 ENCOUNTER — Inpatient Hospital Stay: Payer: No Typology Code available for payment source | Attending: Oncology | Admitting: Oncology

## 2018-01-26 ENCOUNTER — Inpatient Hospital Stay: Payer: No Typology Code available for payment source

## 2018-01-26 VITALS — BP 118/70 | HR 91 | Temp 98.2°F | Resp 20

## 2018-01-26 DIAGNOSIS — Z803 Family history of malignant neoplasm of breast: Secondary | ICD-10-CM | POA: Diagnosis not present

## 2018-01-26 DIAGNOSIS — D696 Thrombocytopenia, unspecified: Secondary | ICD-10-CM | POA: Insufficient documentation

## 2018-01-26 DIAGNOSIS — Z87891 Personal history of nicotine dependence: Secondary | ICD-10-CM | POA: Diagnosis not present

## 2018-01-26 DIAGNOSIS — Z8571 Personal history of Hodgkin lymphoma: Secondary | ICD-10-CM | POA: Diagnosis not present

## 2018-01-26 DIAGNOSIS — Z923 Personal history of irradiation: Secondary | ICD-10-CM | POA: Insufficient documentation

## 2018-01-26 DIAGNOSIS — C8590 Non-Hodgkin lymphoma, unspecified, unspecified site: Secondary | ICD-10-CM

## 2018-01-26 DIAGNOSIS — C3411 Malignant neoplasm of upper lobe, right bronchus or lung: Secondary | ICD-10-CM | POA: Diagnosis present

## 2018-01-26 DIAGNOSIS — C3412 Malignant neoplasm of upper lobe, left bronchus or lung: Secondary | ICD-10-CM | POA: Insufficient documentation

## 2018-01-26 DIAGNOSIS — I10 Essential (primary) hypertension: Secondary | ICD-10-CM | POA: Diagnosis not present

## 2018-01-26 DIAGNOSIS — Z800 Family history of malignant neoplasm of digestive organs: Secondary | ICD-10-CM | POA: Diagnosis not present

## 2018-01-26 LAB — COMPREHENSIVE METABOLIC PANEL
ALT: 20 U/L (ref 0–44)
AST: 35 U/L (ref 15–41)
Albumin: 2.9 g/dL — ABNORMAL LOW (ref 3.5–5.0)
Alkaline Phosphatase: 213 U/L — ABNORMAL HIGH (ref 38–126)
Anion gap: 8 (ref 5–15)
BUN: 17 mg/dL (ref 8–23)
CHLORIDE: 106 mmol/L (ref 98–111)
CO2: 24 mmol/L (ref 22–32)
CREATININE: 1.23 mg/dL — AB (ref 0.44–1.00)
Calcium: 10.7 mg/dL — ABNORMAL HIGH (ref 8.9–10.3)
GFR calc non Af Amer: 41 mL/min — ABNORMAL LOW (ref >60)
GFR, EST AFRICAN AMERICAN: 48 mL/min — AB (ref >60)
GLUCOSE: 115 mg/dL — AB (ref 70–99)
Potassium: 4.2 mmol/L (ref 3.5–5.1)
SODIUM: 138 mmol/L (ref 135–145)
Total Bilirubin: 1 mg/dL (ref 0.3–1.2)
Total Protein: 5.8 g/dL — ABNORMAL LOW (ref 6.5–8.1)

## 2018-01-26 LAB — CBC WITH DIFFERENTIAL/PLATELET
BASOS PCT: 1 %
Basophils Absolute: 0 10*3/uL (ref 0–0.1)
EOS PCT: 37 %
Eosinophils Absolute: 1.7 10*3/uL — ABNORMAL HIGH (ref 0–0.7)
HCT: 29.7 % — ABNORMAL LOW (ref 35.0–47.0)
HEMOGLOBIN: 9.9 g/dL — AB (ref 12.0–16.0)
LYMPHS PCT: 39 %
Lymphs Abs: 1.9 10*3/uL (ref 1.0–3.6)
MCH: 31.2 pg (ref 26.0–34.0)
MCHC: 33.3 g/dL (ref 32.0–36.0)
MCV: 93.5 fL (ref 80.0–100.0)
Monocytes Absolute: 0.8 10*3/uL (ref 0.2–0.9)
Monocytes Relative: 18 %
NEUTROS ABS: 0.2 10*3/uL — AB (ref 1.4–6.5)
NEUTROS PCT: 5 %
Platelets: 68 10*3/uL — ABNORMAL LOW (ref 150–440)
RBC: 3.17 MIL/uL — ABNORMAL LOW (ref 3.80–5.20)
RDW: 21.1 % — ABNORMAL HIGH (ref 11.5–14.5)
WBC: 4.6 10*3/uL (ref 3.6–11.0)

## 2018-01-26 NOTE — Progress Notes (Signed)
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  INTERVAL HISTORY: Patient returns to clinic today for hospital follow-up and consideration of additional diagnostic planning.  She has been admitted to the hospital twice in the past several weeks for acute encephalopathy, possibly secondary to pneumonia.  She also was documented to have hypercalcemia, although this has resolved.  Her performance status has declined and she is now at a rehab facility.  She has no neurologic complaints.  Family members do not report any other episodes of confusion.  She has a fair appetite, but denies weight loss. She denies any fevers or night sweats. She has no chest pain, cough, hemoptysis, or shortness of breath.  She denies any nausea, vomiting, constipation, or diarrhea.   She has no urinary complaints.  Patient offers no further specific complaints today.  REVIEW OF SYSTEMS:   Review of Systems  Constitutional: Positive for malaise/fatigue. Negative for fever and weight loss.  Respiratory: Negative.  Negative for cough, hemoptysis and shortness of breath.   Cardiovascular: Negative.  Negative for chest pain and leg swelling.  Gastrointestinal: Negative.  Negative for abdominal pain and constipation.  Genitourinary: Negative.  Negative for dysuria.  Musculoskeletal: Negative.   Skin: Negative.  Negative for rash.  Neurological: Positive for weakness. Negative for dizziness, sensory change, focal weakness and headaches.  Endo/Heme/Allergies: Negative.   Psychiatric/Behavioral: Negative.  Negative for memory loss. The patient is not nervous/anxious.     As per HPI. Otherwise, a complete review  of systems is negative.  PAST MEDICAL HISTORY: Past Medical History:  Diagnosis Date  . Anemia   . Chest pain, unspecified   . Chronic kidney disease   . COPD (chronic obstructive pulmonary disease) ([place])   . Epistaxis   . Erosive gastropathy   . GERD (gastroesophageal reflux disease)   . High cholesterol   . Hodgkin's lymphoma ([place]) 2014   chemo  . Hodgkin's lymphoma ([place])   . Hypercalcemia   . Hypertension   . Lung cancer ([place]) 2015  . Pneumonia   . 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 Shortness of breath dyspnea     PAST SURGICAL HISTORY: Past Surgical History:  Procedure Laterality Date  . ABDOMINAL HYSTERECTOMY    . APPENDECTOMY    . COLONOSCOPY WITH PROPOFOL N/A 10/01/2015   Procedure: COLONOSCOPY WITH PROPOFOL;  Surgeon: Manya Silvas, MD;  Location: [facility] ENDOSCOPY;  Service: Endoscopy;  Laterality: N/A;  . NASAL SINUS SURGERY    . PORTA CATH REMOVAL N/A 07/21/2016   Procedure: Glori Luis Cath Removal;  Surgeon: Algernon Huxley, MD;  Location: [facility] LAB;  Service: Cardiovascular;  Laterality: N/A;  . right lower lobe wedge resection      FAMILY HISTORY Family History  Problem Relation Age of Onset  . Diabetes Other   . CAD Other   . Cancer Other        colon cancer, breast cancer  . Kidney disease Other   . Breast cancer Maternal Aunt   . Prostate cancer Father   . Gastric cancer Mother   . Kidney cancer Neg Hx        ADVANCED DIRECTIVES:    HEALTH MAINTENANCE: Social History   Tobacco Use  .  Smoking status: Former Smoker    Packs/day: 1.00    Years: 18.00    Pack years: 18.00    Types: Cigarettes    Last attempt to quit: 2013    Years since quitting: 6.6  . Smokeless tobacco: Never Used  Substance Use Topics  . Alcohol use: Not Currently    Alcohol/week: 1.0 standard drinks    Types: 1 Glasses of wine per week    Comment: Occassionaly  . Drug use: No     Colonoscopy:  PAP:  Bone density:  Lipid panel:  Allergies  Allergen  Reactions  . Hydrochlorothiazide Other (See Comments)    Elevation of creatinine Patient doesn't remember what reaction she had to it.    Current Outpatient Medications  Medication Sig Dispense Refill  . albuterol (PROVENTIL HFA;VENTOLIN HFA) 108 (90 BASE) MCG/ACT inhaler Inhale 2 puffs into the lungs every 6 (six) hours as needed for wheezing or shortness of breath.    Marland Kitchen amLODipine (NORVASC) 10 MG tablet Take 1 tablet (10 mg total) by mouth daily.    Marland Kitchen antiseptic oral rinse (BIOTENE) LIQD 15 mLs by Mouth Rinse route as needed for dry mouth.    Marland Kitchen atorvastatin (LIPITOR) 40 MG tablet Take 1 tablet (40 mg total) by mouth daily. 30 tablet 0  . benazepril (LOTENSIN) 20 MG tablet Take 1 tablet (20 mg total) by mouth daily.    . calcium carbonate (OS-CAL - DOSED IN MG OF ELEMENTAL CALCIUM) 1250 (500 Ca) MG tablet Take 1 tablet by mouth 2 (two) times daily with a meal.     . carvedilol (COREG) 12.5 MG tablet Take 1 tablet (12.5 mg total) by mouth 2 (two) times daily with a meal.    . mirtazapine (REMERON) 15 MG tablet Take 15 mg by mouth at bedtime.     . Multiple Vitamins-Minerals (MULTIVITAMIN PO) Take 1 tablet by mouth daily.     Marland Kitchen triamcinolone cream (KENALOG) 0.1 % Apply 1 application topically 2 (two) times daily.  1  . vitamin B-12 (CYANOCOBALAMIN) 1000 MCG tablet Take 1,000 mcg by mouth daily.     No current facility-administered medications for this visit.    Facility-Administered Medications Ordered in Other Visits  Medication Dose Route Frequency Provider Last Rate Last Dose  . heparin lock flush 100 unit/mL  500 Units Intravenous Once Lloyd Huger, MD      . sodium chloride 0.9 % injection 10 mL  10 mL Intravenous PRN Lloyd Huger, MD   10 mL at 02/09/15 0844    OBJECTIVE: Vitals:   01/26/18 0947  BP: 118/70  Pulse: 91  Resp: 20  Temp: 98.2 F (36.8 C)     There is no height or weight on file to calculate BMI.    ECOG FS:0 - Asymptomatic  General:  Well-developed, well-nourished, no acute distress.  Sitting in a wheelchair. Eyes: Pink conjunctiva, anicteric sclera. HEENT: Normocephalic, moist mucous membranes, clear oropharnyx.  No palpable lymphadenopathy. Lungs: Clear to auscultation bilaterally. Heart: Regular rate and rhythm. No rubs, murmurs, or gallops. Abdomen: Soft, nontender, nondistended. No organomegaly noted, normoactive bowel sounds. Musculoskeletal: No edema, cyanosis, or clubbing. Neuro: Alert, answering all questions appropriately. Cranial nerves grossly intact. Skin: No rashes or petechiae noted. Psych: Normal affect. Lymphatics: No cervical, calvicular, axillary or inguinal LAD.  LAB RESULTS:  Lab Results  Component Value Date   NA 138 01/26/2018   K 4.2 01/26/2018   CL 106 01/26/2018   CO2 24 01/26/2018   GLUCOSE  115 (H) 01/26/2018   BUN 17 01/26/2018   CREATININE 1.23 (H) 01/26/2018   CALCIUM 10.7 (H) 01/26/2018   PROT 5.8 (L) 01/26/2018   ALBUMIN 2.9 (L) 01/26/2018   AST 35 01/26/2018   ALT 20 01/26/2018   ALKPHOS 213 (H) 01/26/2018   BILITOT 1.0 01/26/2018   GFRNONAA 41 (L) 01/26/2018   GFRAA 48 (L) 01/26/2018    Lab Results  Component Value Date   WBC 4.6 01/26/2018   NEUTROABS PENDING 01/26/2018   HGB 9.9 (L) 01/26/2018   HCT 29.7 (L) 01/26/2018   MCV 93.5 01/26/2018   PLT 68 (L) 01/26/2018     STUDIES: Dg Chest 2 View  Result Date: 01/14/2018 CLINICAL DATA:  Weakness. Abdominal pain. History of COPD, lung cancer, non-Hodgkin's lymphoma. EXAM: CHEST - 2 VIEW COMPARISON:  CT chest January 05, 2018 and chest radiograph January 03, 2018. FINDINGS: Stable cardiomegaly. Mediastinal silhouette is not suspicious. Calcified aortic arch. LEFT mid lung zone bandlike density. Chronic interstitial changes without pleural effusion. Patchy RIGHT mid and lower lung zone airspace opacities. Biapical bullous changes. No pneumothorax. Mild degenerative changes thoracic spine. Osteopenia. IMPRESSION: 1. Stable  cardiomegaly. 2. COPD with LEFT mid lung zone scarring. Similar patchy RIGHT mid and lower lung zone airspace opacities. Aortic Atherosclerosis (ICD10-I70.0). Electronically Signed   By: Elon Alas M.D.   On: 01/14/2018 18:57   Dg Chest 2 View  Result Date: 01/03/2018 CLINICAL DATA:  77 year old female with intermittent dizziness and lightheadedness since Thursday. 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 EXAM: CHEST - 2 VIEW COMPARISON:  Most recent prior imaging is a PET-CT from 09/15/2017 FINDINGS: Mild cardiomegaly. Atherosclerotic calcifications are noted in the transverse aorta. The mediastinal contours are within normal limits. Biapical pleuroparenchymal scarring. Overall hyperinflation with hyperlucency in the upper lungs consistent with underlying emphysema. Areas of linear airspace opacity are present in the lingula and right lower lobe. Comparing across modalities to the prior PET-CT demonstrates a similar distribution of pleuroparenchymal scarring. No definite new airspace consolidation, pleural effusion or pneumothorax. No acute osseous abnormality. IMPRESSION: Comparing across modalities to the prior PET-CT dated 09/15/2017, similar appearance of the lungs with a combination of patchy and linear airspace opacities in the lingula, and in the upper and lower aspects of the right lower lobe. Findings may reflect chronic pleuroparenchymal scarring and/or chronic/indolent infection. Stable cardiomegaly. Aortic Atherosclerosis (ICD10-I70.0) and Emphysema (ICD10-J43.9). Electronically Signed   By: Jacqulynn Cadet M.D.   On: 01/03/2018 14:17   Dg Lumbar Spine Complete  Result Date: 01/14/2018 CLINICAL DATA:  Back pain after fall EXAM: LUMBAR SPINE - COMPLETE 4+ VIEW COMPARISON:  CT 10/07/2015 FINDINGS: Lumbar alignment within normal limits. Moderate degenerative change at L5-S1. Mild degenerative changes elsewhere in the  spine. Vertebral body heights are grossly maintained. Heavily calcified aorta. IMPRESSION: Mild to moderate multilevel degenerative changes. No definite acute osseous abnormality. Electronically Signed   By: Donavan Foil M.D.   On: 01/14/2018 20:40   Dg Pelvis 1-2 Views  Result Date: 01/14/2018 CLINICAL DATA:  Low back pain after fall EXAM: PELVIS - 1-2 VIEW COMPARISON:  CT 10/07/2015 FINDINGS: SI joint are non widened. Pubic symphysis and rami are intact. Both femoral heads project in joint. Possible step-off deformity at the right subcapital femur. IMPRESSION: Possible subcapital step-off deformity involving proximal right femur. Suggest CT for further evaluation. Electronically Signed   By: Donavan Foil M.D.   On: 01/14/2018 20:37   Ct  Head Wo Contrast  Result Date: 01/14/2018 CLINICAL DATA:  Fall with unknown LOC EXAM: CT HEAD WITHOUT CONTRAST TECHNIQUE: Contiguous axial images were obtained from the base of the skull through the vertex without intravenous contrast. COMPARISON:  MRI 01/06/2018, CT brain 01/05/2018 FINDINGS: Brain: No acute territorial infarction, hemorrhage or intracranial mass. The ventricles are of normal size. Mild atrophy. Minimal small vessel ischemic changes of the white matter. Vascular: No hyperdense vessels.  Carotid vascular calcification. Skull: Normal. Negative for fracture or focal lesion. Sinuses/Orbits: No acute finding. Other: None IMPRESSION: No CT evidence for acute intracranial abnormality. Atrophy and minimal small vessel ischemic change of the white matter Electronically Signed   By: Donavan Foil M.D.   On: 01/14/2018 18:34   Ct Chest W Contrast  Result Date: 01/05/2018 CLINICAL DATA:  Hypercalcemia, history of lung cancer, Hodgkin's lymphoma, COPD, hypertension, former smoker EXAM: CT CHEST WITH CONTRAST TECHNIQUE: Multidetector CT imaging of the chest was performed during intravenous contrast administration. Sagittal and coronal MPR images reconstructed from  axial data set. CONTRAST:  31mL OMNIPAQUE IOHEXOL 300 MG/ML SOLN IV. No oral contrast. COMPARISON:  PET/CT 09/15/2017 FINDINGS: Cardiovascular: Atherosclerotic calcifications aorta, coronary arteries and proximal great vessels. Aorta normal caliber. Pulmonary arteries adequately opacified without gross filling defect by non dedicated exam. Minimal pericardial effusion. Mediastinum/Nodes: No esophageal abnormalities. Base of cervical region normal appearance. No thoracic adenopathy. Lungs/Pleura: Severe emphysematous changes. Central peribronchial thickening. Infiltrate identified in RIGHT lower lobe and posterior aspect of RIGHT upper lobe new since previous exam associated with thickening of the major and minor fissures posteriorly in mid RIGHT chest. These findings could represent pneumonia but cannot exclude underlying tumor within these areas of consolidation in the posterior RIGHT lung. Subsegmental atelectasis lingula. Few calcified granulomata within RIGHT lower lobe. Small RIGHT pleural effusion. No pneumothorax. Minimal dependent atelectasis or infiltrate in LEFT lower lobe, potentially obscuring the previously seen LEFT lower lobe pulmonary nodule versus interval resolution of nodule. No additional mass/nodule. Paramediastinal fibrosis medial LEFT upper lobe. Upper Abdomen: Splenic enlargement. Triangular defect and splenic enhancement at medial spleen, could be an artifact related to early phase of imaging but a splenic infarct is not excluded, area in question 3.3 x 2.0 cm. Cortical scarring and cysts within the upper kidneys bilaterally. Remaining visualized upper abdomen unremarkable. Musculoskeletal: Demineralized without focal lesion. IMPRESSION: COPD changes with new infiltrates identified in the posterior mid light lung involving the RIGHT lower lobe and posterior RIGHT upper lobe, associated with pleural thickening at the posterior aspects of the RIGHT major and minor fissures; this could  represent infection though underlying tumor is not excluded with this appearance. Chronic subsegmental atelectasis versus scarring in lingula. Previously identified LEFT lower lobe pulmonary nodule is in not visualized on current study, question resolved versus obscured by mild atelectasis or infiltrate dependently in LEFT lower lobe. Minimal pericardial effusion. Splenomegaly with question new small splenic infarct versus flow artifact at medial spleen. Aortic Atherosclerosis (ICD10-I70.0) and Emphysema (ICD10-J43.9). These results will be called to the ordering clinician or representative by the Radiologist Assistant, and communication documented in the PACS or zVision Dashboard. Electronically Signed   By: Lavonia Dana M.D.   On: 01/05/2018 12:52   Mr Brain Wo Contrast  Result Date: 01/06/2018 CLINICAL DATA:  Unexplained dysphagia. Three 4 days of intermittent lightheadedness and dizziness EXAM: MRI HEAD WITHOUT CONTRAST TECHNIQUE: Multiplanar, multiecho pulse sequences of the brain and surrounding structures were obtained without intravenous contrast. COMPARISON:  Head CT from yesterday. FINDINGS: Brain: No acute  infarction, hemorrhage, hydrocephalus, extra-axial collection or mass lesion. Age normal brain volume. Minimal for age signal abnormality in the cerebral white matter and pons, presumed remote microvascular insults. No chronic blood products. No finding in the brainstem, cisterns, or skull base to explain history of dysphagia. Vascular: Major flow voids are preserved Skull and upper cervical spine: No evident marrow lesion. The visible cervical disc spaces are narrowed with endplate spurring. Sinuses/Orbits: Mild mucosal thickening in the paranasal sinuses, mainly maxillary sinuses. Negative orbits. IMPRESSION: Unremarkable MRI of the brain for age.  No explanation for symptoms. Electronically Signed   By: Monte Fantasia M.D.   On: 01/06/2018 10:24   Ct Hip Right Wo Contrast  Result Date:  01/14/2018 CLINICAL DATA:  Possible right hip fracture. EXAM: CT OF THE RIGHT HIP WITHOUT CONTRAST TECHNIQUE: Multidetector CT imaging of the right hip was performed according to the standard protocol. Multiplanar CT image reconstructions were also generated. COMPARISON:  Pelvis radiograph 01/14/2018 FINDINGS: Bones/Joint/Cartilage There is no evidence of fracture. Ligaments Suboptimally assessed by CT. Muscles and Tendons Normal. Soft tissues Normal. IMPRESSION: No evidence of hip fracture. Electronically Signed   By: Fidela Salisbury M.D.   On: 01/14/2018 23:11   US Carotid Bilateral  Result Date: 01/07/2018 CLINICAL DATA:  TIA, dizziness, hypertension and hyperlipidemia. EXAM: BILATERAL CAROTID DUPLEX ULTRASOUND TECHNIQUE: Pearline Cables scale imaging, color Doppler and duplex ultrasound were performed of bilateral carotid and vertebral arteries in the neck. COMPARISON:  None. FINDINGS: Criteria: Quantification of carotid stenosis is based on velocity parameters that correlate the residual internal carotid diameter with NASCET-based stenosis levels, using the diameter of the distal internal carotid lumen as the denominator for stenosis measurement. The following velocity measurements were obtained: RIGHT ICA:  81/21 cm/sec CCA:  02/72 cm/sec SYSTOLIC ICA/CCA RATIO:  1.2 ECA:  86 cm/sec LEFT ICA:  103/22 cm/sec CCA:  53/66 cm/sec SYSTOLIC ICA/CCA RATIO:  1.5 ECA:  82 cm/sec RIGHT CAROTID ARTERY: There is a mild amount of predominately calcified plaque at the level of the distal bulb and proximal right ICA. Estimated right ICA stenosis is less than 50%. RIGHT VERTEBRAL ARTERY: Antegrade flow with normal waveform and velocity. LEFT CAROTID ARTERY: Moderate calcified plaque at the level of the distal common carotid artery. Mild plaque at the level of the carotid bulb and proximal ICA. Estimated left ICA stenosis is less than 50%. LEFT VERTEBRAL ARTERY: Antegrade flow with normal waveform and velocity. IMPRESSION: No  significant evidence of ICA stenosis with estimated bilateral ICA stenoses of less than 50%. Plaque is present bilaterally with mild plaque at the level of the carotid bulbs and proximal internal carotid arteries bilaterally and moderate plaque in the distal aspect of the left common carotid artery. Electronically Signed   By: Aletta Edouard M.D.   On: 01/07/2018 08:37   Ct Head Code Stroke Wo Contrast  Result Date: 01/05/2018 CLINICAL DATA:  Code stroke. 77 y/o F; episode of slurred speech and slow to respond starting 30 minutes ago. EXAM: CT HEAD WITHOUT CONTRAST TECHNIQUE: Contiguous axial images were obtained from the base of the skull through the vertex without intravenous contrast. COMPARISON:  None. FINDINGS: Brain: No evidence of acute infarction, hemorrhage, hydrocephalus, extra-axial collection or mass lesion/mass effect. Few foci of white matter hypoattenuation are nonspecific but consistent with mild chronic microvascular ischemic changes. There is mild volume loss of the brain. Dystrophic calcifications within the basal ganglia noted. Vascular: Calcific atherosclerosis of the carotid siphons. No hyperdense vessel identified. Skull: Normal. Negative for fracture or  focal lesion. Sinuses/Orbits: No acute finding. Other: None. ASPECTS [facility] Stroke Program Early CT Score) - Ganglionic level infarction (caudate, lentiform nuclei, internal capsule, insula, M1-M3 cortex): 7 - Supraganglionic infarction (M4-M6 cortex): 3 Total score (0-10 with 10 being normal): 10 IMPRESSION: 1. No acute intracranial abnormality identified. 2. ASPECTS is 10 3. Mild chronic microvascular ischemic changes and volume loss of the brain. These results were called by telephone at the time of interpretation on 01/05/2018 at 4:21 pm to Dr. Vaughan Basta , who verbally acknowledged these results. Electronically Signed   By: Kristine Garbe M.D.   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  PLAN:    1.  Hodgkin's lymphoma: Patient was initially diagnosed in February 2013.  PET scan results from September 17, 2017 reviewed independently with no obvious evidence of disease.  Will repeat PET next week with follow-up 1 to 2 days later.   2.  Left lower lobe nodule: Mildly increased in size from 3 to 7 mm.  PET scan as above. 2.  Left upper lobe lung cancer: Previously biopsied and confirmed squamous cell carcinoma.  Patient was subsequently treated with XRT only.  PET scan as above.   3. Right lower lobe nodule: Also biopsied and confirmed to be squamous cell carcinoma.  Patient completed treatment with XRT in November 2018.  PET scan as above. 4.  Hypercalcemia: Patient's calcium levels have increased to 10.7.  She last received 3 mg of Zometa on January 06, 2018.  Consider retreatment at next clinic visit. 5.  Anemia: Hemoglobin has slightly trended down to 9.9, monitor.  6.  Thrombocytopenia: Decreased, but improved since hospital admission.  Monitor.   Patient expressed understanding and was in agreement with this plan. She also understands that She can call clinic at any time with any questions, concerns, or complaints.   Lloyd Huger, MD   01/26/2018 10:48 AM

## 2018-01-26 NOTE — Progress Notes (Signed)
Patient here today for hospital follow up.

## 2018-01-29 NOTE — Progress Notes (Signed)
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  INTERVAL HISTORY: Patient returns to clinic today for further evaluation and discussion of her PET scan results.  She continues to have a decreased performance status as well as intermittent confusion.  She has chronic shortness of breath and cough.  She has no neurologic complaints.  She has a poor appetite with early satiety.  She denies any fevers or night sweats.  She denies any chest pain or hemoptysis. She denies any nausea, vomiting, constipation, or diarrhea.   She has no urinary complaints.  Patient offers no further specific complaints today.  REVIEW OF SYSTEMS:   Review of Systems  Constitutional: Positive for malaise/fatigue and weight loss. Negative for fever.  Respiratory: Positive for cough and shortness of breath. Negative for hemoptysis.   Cardiovascular: Negative.  Negative for chest pain and leg swelling.  Gastrointestinal: Negative.  Negative for abdominal pain and constipation.  Genitourinary: Negative.  Negative for dysuria.  Musculoskeletal: Negative.   Skin: Negative.  Negative for rash.  Neurological: Positive for weakness. Negative for dizziness, sensory change, focal weakness and headaches.  Endo/Heme/Allergies: Negative.   Psychiatric/Behavioral: Negative.  Negative for memory loss. The patient is not nervous/anxious.     As per HPI. Otherwise, a complete review of systems is negative.  PAST MEDICAL HISTORY: Past Medical History:  Diagnosis Date  . Anemia   . Chest pain, unspecified   . Chronic kidney disease   . COPD (chronic obstructive pulmonary disease) ([place])   . Epistaxis   . Erosive  gastropathy   . GERD (gastroesophageal reflux disease)   . High cholesterol   . Hodgkin's lymphoma ([place]) 2014   chemo  . Hodgkin's lymphoma ([place])   . Hypercalcemia   . Hypertension   . Lung cancer ([place]) 2015  . Pneumonia   . 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 Shortness of breath dyspnea     PAST SURGICAL HISTORY: Past Surgical History:  Procedure Laterality Date  . ABDOMINAL HYSTERECTOMY    . APPENDECTOMY    . COLONOSCOPY WITH PROPOFOL N/A 10/01/2015   Procedure: COLONOSCOPY WITH PROPOFOL;  Surgeon: Manya Silvas, MD;  Location: [facility] ENDOSCOPY;  Service: Endoscopy;  Laterality: N/A;  . NASAL SINUS SURGERY    . PORTA CATH REMOVAL N/A 07/21/2016   Procedure: Glori Luis Cath Removal;  Surgeon: Algernon Huxley, MD;  Location: [facility] LAB;  Service: Cardiovascular;  Laterality: N/A;  . right lower lobe wedge resection      FAMILY HISTORY Family History  Problem Relation Age of Onset  . Diabetes Other   . CAD Other   . Cancer Other        colon cancer, breast cancer  . Kidney disease Other   . Breast cancer Maternal Aunt   . Prostate cancer Father   . Gastric cancer Mother   . Kidney cancer Neg Hx        ADVANCED DIRECTIVES:    HEALTH MAINTENANCE: Social History   Tobacco Use  . Smoking status: Former Smoker    Packs/day: 1.00    Years: 18.00    Pack years: 18.00    Types: Cigarettes    Last attempt to quit: 2013    Years since  quitting: 6.6  . Smokeless tobacco: Never Used  Substance Use Topics  . Alcohol use: Not Currently    Alcohol/week: 1.0 standard drinks    Types: 1 Glasses of wine per week    Comment: Occassionaly  . Drug use: No     Colonoscopy:  PAP:  Bone density:  Lipid panel:  Allergies  Allergen Reactions  . Hydrochlorothiazide Other (See Comments)    Elevation of creatinine Patient doesn't remember what reaction she had to it.    Current Outpatient Medications  Medication Sig Dispense Refill  . albuterol (PROVENTIL HFA;VENTOLIN  HFA) 108 (90 BASE) MCG/ACT inhaler Inhale 2 puffs into the lungs every 6 (six) hours as needed for wheezing or shortness of breath.    Marland Kitchen amLODipine (NORVASC) 10 MG tablet Take 1 tablet (10 mg total) by mouth daily.    Marland Kitchen antiseptic oral rinse (BIOTENE) LIQD 15 mLs by Mouth Rinse route as needed for dry mouth.    Marland Kitchen atorvastatin (LIPITOR) 40 MG tablet Take 1 tablet (40 mg total) by mouth daily. 30 tablet 0  . benazepril (LOTENSIN) 20 MG tablet Take 1 tablet (20 mg total) by mouth daily.    . calcium carbonate (OS-CAL - DOSED IN MG OF ELEMENTAL CALCIUM) 1250 (500 Ca) MG tablet Take 1 tablet by mouth 2 (two) times daily with a meal.     . carvedilol (COREG) 12.5 MG tablet Take 1 tablet (12.5 mg total) by mouth 2 (two) times daily with a meal.    . mirtazapine (REMERON) 15 MG tablet Take 15 mg by mouth at bedtime.     . Multiple Vitamins-Minerals (MULTIVITAMIN PO) Take 1 tablet by mouth daily.     Marland Kitchen triamcinolone cream (KENALOG) 0.1 % Apply 1 application topically 2 (two) times daily.  1  . vitamin B-12 (CYANOCOBALAMIN) 1000 MCG tablet Take 1,000 mcg by mouth daily.     No current facility-administered medications for this visit.    Facility-Administered Medications Ordered in Other Visits  Medication Dose Route Frequency Provider Last Rate Last Dose  . heparin lock flush 100 unit/mL  500 Units Intravenous Once Lloyd Huger, MD      . sodium chloride 0.9 % injection 10 mL  10 mL Intravenous PRN Lloyd Huger, MD   10 mL at 02/09/15 0844    OBJECTIVE: Vitals:   02/02/18 1006  BP: 135/86  Pulse: 75  Resp: 18  Temp: (!) 97.5 F (36.4 C)     There is no height or weight on file to calculate BMI.    ECOG FS:0 - Asymptomatic  General: Thin, no acute distress.  Sitting in a wheelchair. Eyes: Pink conjunctiva, anicteric sclera. HEENT: Normocephalic, moist mucous membranes, no palpable lymphadenopathy. Lungs: Clear to auscultation bilaterally. Heart: Regular rate and rhythm. No rubs,  murmurs, or gallops. Abdomen: Soft, palpable splenomegaly. Musculoskeletal: No edema, cyanosis, or clubbing. Neuro: Alert, answering all questions appropriately. Cranial nerves grossly intact. Skin: No rashes or petechiae noted. Psych: Normal affect.  LAB RESULTS:  Lab Results  Component Value Date   NA 139 02/02/2018   K 3.9 02/02/2018   CL 106 02/02/2018   CO2 25 02/02/2018   GLUCOSE 102 (H) 02/02/2018   BUN 21 02/02/2018   CREATININE 1.30 (H) 02/02/2018   CALCIUM 11.6 (H) 02/02/2018   PROT 5.8 (L) 01/26/2018   ALBUMIN 2.9 (L) 01/26/2018   AST 35 01/26/2018   ALT 20 01/26/2018   ALKPHOS 213 (H) 01/26/2018   BILITOT 1.0 01/26/2018  GFRNONAA 39 (L) 02/02/2018   GFRAA 45 (L) 02/02/2018    Lab Results  Component Value Date   WBC 7.7 02/02/2018   NEUTROABS 0.5 (L) 02/02/2018   HGB 10.3 (L) 02/02/2018   HCT 30.7 (L) 02/02/2018   MCV 91.9 02/02/2018   PLT 65 (L) 02/02/2018     STUDIES: Dg Chest 2 View  Result Date: 01/14/2018 CLINICAL DATA:  Weakness. Abdominal pain. History of COPD, lung cancer, non-Hodgkin's lymphoma. EXAM: CHEST - 2 VIEW COMPARISON:  CT chest January 05, 2018 and chest radiograph January 03, 2018. FINDINGS: Stable cardiomegaly. Mediastinal silhouette is not suspicious. Calcified aortic arch. LEFT mid lung zone bandlike density. Chronic interstitial changes without pleural effusion. Patchy RIGHT mid and lower lung zone airspace opacities. Biapical bullous changes. No pneumothorax. Mild degenerative changes thoracic spine. Osteopenia. IMPRESSION: 1. Stable cardiomegaly. 2. COPD with LEFT mid lung zone scarring. Similar patchy RIGHT mid and lower lung zone airspace opacities. Aortic Atherosclerosis (ICD10-I70.0). Electronically Signed   By: Elon Alas M.D.   On: 01/14/2018 18:57   Dg Chest 2 View  Result Date: 01/03/2018 CLINICAL DATA:  77 year old female with intermittent dizziness and lightheadedness since Thursday. Known history of left upper lobe  non-small cell (squamous) lung cancer and nodular Hodgkin's lymphoma of the neck. EXAM: CHEST - 2 VIEW COMPARISON:  Most recent prior imaging is a PET-CT from 09/15/2017 FINDINGS: Mild cardiomegaly. Atherosclerotic calcifications are noted in the transverse aorta. The mediastinal contours are within normal limits. Biapical pleuroparenchymal scarring. Overall hyperinflation with hyperlucency in the upper lungs consistent with underlying emphysema. Areas of linear airspace opacity are present in the lingula and right lower lobe. Comparing across modalities to the prior PET-CT demonstrates a similar distribution of pleuroparenchymal scarring. No definite new airspace consolidation, pleural effusion or pneumothorax. No acute osseous abnormality. IMPRESSION: Comparing across modalities to the prior PET-CT dated 09/15/2017, similar appearance of the lungs with a combination of patchy and linear airspace opacities in the lingula, and in the upper and lower aspects of the right lower lobe. Findings may reflect chronic pleuroparenchymal scarring and/or chronic/indolent infection. Stable cardiomegaly. Aortic Atherosclerosis (ICD10-I70.0) and Emphysema (ICD10-J43.9). Electronically Signed   By: Jacqulynn Cadet M.D.   On: 01/03/2018 14:17   Dg Lumbar Spine Complete  Result Date: 01/14/2018 CLINICAL DATA:  Back pain after fall EXAM: LUMBAR SPINE - COMPLETE 4+ VIEW COMPARISON:  CT 10/07/2015 FINDINGS: Lumbar alignment within normal limits. Moderate degenerative change at L5-S1. Mild degenerative changes elsewhere in the spine. Vertebral body heights are grossly maintained. Heavily calcified aorta. IMPRESSION: Mild to moderate multilevel degenerative changes. No definite acute osseous abnormality. Electronically Signed   By: Donavan Foil M.D.   On: 01/14/2018 20:40   Dg Pelvis 1-2 Views  Result Date: 01/14/2018 CLINICAL DATA:  Low back pain after fall EXAM: PELVIS - 1-2 VIEW COMPARISON:  CT 10/07/2015 FINDINGS: SI joint  are non widened. Pubic symphysis and rami are intact. Both femoral heads project in joint. Possible step-off deformity at the right subcapital femur. IMPRESSION: Possible subcapital step-off deformity involving proximal right femur. Suggest CT for further evaluation. Electronically Signed   By: Donavan Foil M.D.   On: 01/14/2018 20:37   Ct Head Wo Contrast  Result Date: 01/14/2018 CLINICAL DATA:  Fall with unknown LOC EXAM: CT HEAD WITHOUT CONTRAST TECHNIQUE: Contiguous axial images were obtained from the base of the skull through the vertex without intravenous contrast. COMPARISON:  MRI 01/06/2018, CT brain 01/05/2018 FINDINGS: Brain: No acute territorial infarction, hemorrhage or intracranial mass.  The ventricles are of normal size. Mild atrophy. Minimal small vessel ischemic changes of the white matter. Vascular: No hyperdense vessels.  Carotid vascular calcification. Skull: Normal. Negative for fracture or focal lesion. Sinuses/Orbits: No acute finding. Other: None IMPRESSION: No CT evidence for acute intracranial abnormality. Atrophy and minimal small vessel ischemic change of the white matter Electronically Signed   By: Donavan Foil M.D.   On: 01/14/2018 18:34   Ct Chest W Contrast  Result Date: 01/05/2018 CLINICAL DATA:  Hypercalcemia, history of lung cancer, Hodgkin's lymphoma, COPD, hypertension, former smoker EXAM: CT CHEST WITH CONTRAST TECHNIQUE: Multidetector CT imaging of the chest was performed during intravenous contrast administration. Sagittal and coronal MPR images reconstructed from axial data set. CONTRAST:  57m OMNIPAQUE IOHEXOL 300 MG/ML SOLN IV. No oral contrast. COMPARISON:  PET/CT 09/15/2017 FINDINGS: Cardiovascular: Atherosclerotic calcifications aorta, coronary arteries and proximal great vessels. Aorta normal caliber. Pulmonary arteries adequately opacified without gross filling defect by non dedicated exam. Minimal pericardial effusion. Mediastinum/Nodes: No esophageal  abnormalities. Base of cervical region normal appearance. No thoracic adenopathy. Lungs/Pleura: Severe emphysematous changes. Central peribronchial thickening. Infiltrate identified in RIGHT lower lobe and posterior aspect of RIGHT upper lobe new since previous exam associated with thickening of the major and minor fissures posteriorly in mid RIGHT chest. These findings could represent pneumonia but cannot exclude underlying tumor within these areas of consolidation in the posterior RIGHT lung. Subsegmental atelectasis lingula. Few calcified granulomata within RIGHT lower lobe. Small RIGHT pleural effusion. No pneumothorax. Minimal dependent atelectasis or infiltrate in LEFT lower lobe, potentially obscuring the previously seen LEFT lower lobe pulmonary nodule versus interval resolution of nodule. No additional mass/nodule. Paramediastinal fibrosis medial LEFT upper lobe. Upper Abdomen: Splenic enlargement. Triangular defect and splenic enhancement at medial spleen, could be an artifact related to early phase of imaging but a splenic infarct is not excluded, area in question 3.3 x 2.0 cm. Cortical scarring and cysts within the upper kidneys bilaterally. Remaining visualized upper abdomen unremarkable. Musculoskeletal: Demineralized without focal lesion. IMPRESSION: COPD changes with new infiltrates identified in the posterior mid light lung involving the RIGHT lower lobe and posterior RIGHT upper lobe, associated with pleural thickening at the posterior aspects of the RIGHT major and minor fissures; this could represent infection though underlying tumor is not excluded with this appearance. Chronic subsegmental atelectasis versus scarring in lingula. Previously identified LEFT lower lobe pulmonary nodule is in not visualized on current study, question resolved versus obscured by mild atelectasis or infiltrate dependently in LEFT lower lobe. Minimal pericardial effusion. Splenomegaly with question new small splenic  infarct versus flow artifact at medial spleen. Aortic Atherosclerosis (ICD10-I70.0) and Emphysema (ICD10-J43.9). These results will be called to the ordering clinician or representative by the Radiologist Assistant, and communication documented in the PACS or zVision Dashboard. Electronically Signed   By: MLavonia DanaM.D.   On: 01/05/2018 12:52   Mr Brain Wo Contrast  Result Date: 01/06/2018 CLINICAL DATA:  Unexplained dysphagia. Three 4 days of intermittent lightheadedness and dizziness EXAM: MRI HEAD WITHOUT CONTRAST TECHNIQUE: Multiplanar, multiecho pulse sequences of the brain and surrounding structures were obtained without intravenous contrast. COMPARISON:  Head CT from yesterday. FINDINGS: Brain: No acute infarction, hemorrhage, hydrocephalus, extra-axial collection or mass lesion. Age normal brain volume. Minimal for age signal abnormality in the cerebral white matter and pons, presumed remote microvascular insults. No chronic blood products. No finding in the brainstem, cisterns, or skull base to explain history of dysphagia. Vascular: Major flow voids are preserved Skull and  upper cervical spine: No evident marrow lesion. The visible cervical disc spaces are narrowed with endplate spurring. Sinuses/Orbits: Mild mucosal thickening in the paranasal sinuses, mainly maxillary sinuses. Negative orbits. IMPRESSION: Unremarkable MRI of the brain for age.  No explanation for symptoms. Electronically Signed   By: Monte Fantasia M.D.   On: 01/06/2018 10:24   Ct Hip Right Wo Contrast  Result Date: 01/14/2018 CLINICAL DATA:  Possible right hip fracture. EXAM: CT OF THE RIGHT HIP WITHOUT CONTRAST TECHNIQUE: Multidetector CT imaging of the right hip was performed according to the standard protocol. Multiplanar CT image reconstructions were also generated. COMPARISON:  Pelvis radiograph 01/14/2018 FINDINGS: Bones/Joint/Cartilage There is no evidence of fracture. Ligaments Suboptimally assessed by CT. Muscles and  Tendons Normal. Soft tissues Normal. IMPRESSION: No evidence of hip fracture. Electronically Signed   By: Fidela Salisbury M.D.   On: 01/14/2018 23:11   US Carotid Bilateral  Result Date: 01/07/2018 CLINICAL DATA:  TIA, dizziness, hypertension and hyperlipidemia. EXAM: BILATERAL CAROTID DUPLEX ULTRASOUND TECHNIQUE: Pearline Cables scale imaging, color Doppler and duplex ultrasound were performed of bilateral carotid and vertebral arteries in the neck. COMPARISON:  None. FINDINGS: Criteria: Quantification of carotid stenosis is based on velocity parameters that correlate the residual internal carotid diameter with NASCET-based stenosis levels, using the diameter of the distal internal carotid lumen as the denominator for stenosis measurement. The following velocity measurements were obtained: RIGHT ICA:  81/21 cm/sec CCA:  79/02 cm/sec SYSTOLIC ICA/CCA RATIO:  1.2 ECA:  86 cm/sec LEFT ICA:  103/22 cm/sec CCA:  40/97 cm/sec SYSTOLIC ICA/CCA RATIO:  1.5 ECA:  82 cm/sec RIGHT CAROTID ARTERY: There is a mild amount of predominately calcified plaque at the level of the distal bulb and proximal right ICA. Estimated right ICA stenosis is less than 50%. RIGHT VERTEBRAL ARTERY: Antegrade flow with normal waveform and velocity. LEFT CAROTID ARTERY: Moderate calcified plaque at the level of the distal common carotid artery. Mild plaque at the level of the carotid bulb and proximal ICA. Estimated left ICA stenosis is less than 50%. LEFT VERTEBRAL ARTERY: Antegrade flow with normal waveform and velocity. IMPRESSION: No significant evidence of ICA stenosis with estimated bilateral ICA stenoses of less than 50%. Plaque is present bilaterally with mild plaque at the level of the carotid bulbs and proximal internal carotid arteries bilaterally and moderate plaque in the distal aspect of the left common carotid artery. Electronically Signed   By: Aletta Edouard M.D.   On: 01/07/2018 08:37   Nm Pet Image Restag (ps) Skull Base To  Thigh  Result Date: 02/01/2018 CLINICAL DATA:  Subsequent treatment strategy for lymphoma and LEFT upper lobe lung cancer. EXAM: NUCLEAR MEDICINE PET SKULL BASE TO THIGH TECHNIQUE: 7.4 mCi F-18 FDG was injected intravenously. Full-ring PET imaging was performed from the skull base to thigh after the radiotracer. CT data was obtained and used for attenuation correction and anatomic localization. Fasting blood glucose: 77 COMPARISON: PET-CT 09/15/17, 05/04/2017 FINDINGS: Mediastinal blood pool activity: SUV max 1.4 NECK: No hypermetabolic lymph nodes in the neck. Incidental CT findings: none CHEST: There is increased consolidation in the RIGHT lower lobe with peribronchial thickening and peripheral nodularity. This consolidated pattern is increased from 09/15/2017. This pattern has only mild metabolic activity for the extent involvement with SUV max equal 2.6. No discrete hypermetabolic nodules in the RIGHT lung. There is severe centrilobular emphysema the upper lobe on the RIGHT. LEFT lung has a peripheral focus of ill-defined parenchymal reticulation in the LEFT lower lobe with SUV max  equal 6.2 which is not changed in size or metabolic activity comparison exam. No hypermetabolic nodules in the LEFT lung. The LEFT lung is also demonstrates severe upper lobe emphysema. No hypermetabolic mediastinal lymph nodes. Incidental CT findings: none ABDOMEN/PELVIS: The dominant finding is new intense metabolic activity within the spleen with SUV max equal 7.0. The spleen is enlarged compared to comparison exam measuring 10 cm in diameter compared to 8 cm in maximum diameter on comparison PET-CT scan. The activity in the spleen is uniform. No hypermetabolic abdominopelvic lymph nodes. No abnormal metabolic activity liver. Incidental CT findings: Atherosclerotic calcification of the aorta. SKELETON: There is uniform increase in metabolic activity within the pelvis and lumbar spine. Less prominent metabolic activity in the  thoracic spine but also mildly increased. Example uniform increased activity in the L5 vertebral body with SUV max equal 5.8. This is a new finding from prior. Incidental CT findings: There is no CT lesion identified. IMPRESSION: 1. Most concerning finding is interval enlargement of the spleen with new hypermetabolic activity. While these combined splenic findings can be seen in a GCSF type response, concern for lymphoma recurrence. Recommend clinical correlation for such. 2. Uniform increase in marrow activity in the lumbar spine and pelvis and lesser degree the thoracic spine. This again can be seen GCSF type response. In the absence of such support, concern for lymphoma recurrence. 3. No evidence lung cancer recurrence in lungs. 4. Increase consolidation in the RIGHT lower lobe is concerning for pneumonia. Potential resolving pneumonia as metabolic activity is minimal. 5. Stable focus of reticulation in the LEFT lower lobe with metabolic activity, favored benign. Electronically Signed   By: Suzy Bouchard M.D.   On: 02/01/2018 16:26   Ct Head Code Stroke Wo Contrast  Result Date: 01/05/2018 CLINICAL DATA:  Code stroke. 77 y/o F; episode of slurred speech and slow to respond starting 30 minutes ago. EXAM: CT HEAD WITHOUT CONTRAST TECHNIQUE: Contiguous axial images were obtained from the base of the skull through the vertex without intravenous contrast. COMPARISON:  None. FINDINGS: Brain: No evidence of acute infarction, hemorrhage, hydrocephalus, extra-axial collection or mass lesion/mass effect. Few foci of white matter hypoattenuation are nonspecific but consistent with mild chronic microvascular ischemic changes. There is mild volume loss of the brain. Dystrophic calcifications within the basal ganglia noted. Vascular: Calcific atherosclerosis of the carotid siphons. No hyperdense vessel identified. Skull: Normal. Negative for fracture or focal lesion. Sinuses/Orbits: No acute finding. Other: None. ASPECTS  [facility] Stroke Program Early CT Score) - Ganglionic level infarction (caudate, lentiform nuclei, internal capsule, insula, M1-M3 cortex): 7 - Supraganglionic infarction (M4-M6 cortex): 3 Total score (0-10 with 10 being normal): 10 IMPRESSION: 1. No acute intracranial abnormality identified. 2. ASPECTS is 10 3. Mild chronic microvascular ischemic changes and volume loss of the brain. These results were called by telephone at the time of interpretation on 01/05/2018 at 4:21 pm to Dr. Vaughan Basta , who verbally acknowledged these results. Electronically Signed   By: Kristine Garbe M.D.   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  PLAN:    1.  Hodgkin's lymphoma: Patient was initially diagnosed in February 2013.  PET scan results from February 01, 2018 reviewed independently and report as above with interval enlargement and hypermetabolism of the spleen.  The uniform increase in bone marrow activity is also concerning.  Would be highly unusual for Hodgkin's lymphoma to recur this  late and in this manner, but possible.  Patient may also have a second lymphoma involving the spleen and the bone marrow.  Given her performance status, she would not be able to tolerate treatment at this point.  Peripheral blood flow cytometry was drawn for completeness.  If patient performance status improves, she would agree to a bone marrow biopsy to determine diagnosis.   2.  Left lower lobe nodule: PET scan results as above with no evidence of recurrence. 2.  Left upper lobe lung cancer: Previously biopsied and confirmed squamous cell carcinoma.  Patient was subsequently treated with XRT only.  PET scan as above.   3. Right lower lobe nodule: Also biopsied and confirmed to be squamous cell carcinoma.  Patient completed treatment with XRT in November 2018.  PET scan as above. 4.  Hypercalcemia:  Patient's calcium levels continue to increase and is now 11.6.  Possibly secondary to underlying lymphoma.  This is also contributing to her confusion.  Proceed with Zometa today.  Repeat laboratory work at her recent facility in 2 weeks and return to clinic in 4 weeks for repeat laboratory work, further evaluation, and consideration of additional Zometa. 5.  Thrombocytopenia: Possibly secondary to bone marrow infiltration.  Monitor.   6.  Neutropenia: Patient's ANC is 500 despite a normal white blood cell count.  Again, possibly secondary to bone marrow infiltration as well as documented splenomegaly. 7.  Anemia: Decreased, but stable.  Patient does not require blood transfusion at this time    Patient expressed understanding and was in agreement with this plan. She also understands that She can call clinic at any time with any questions, concerns, or complaints.   Lloyd Huger, MD   02/02/2018 11:54 AM

## 2018-01-30 ENCOUNTER — Other Ambulatory Visit: Payer: Self-pay | Admitting: Oncology

## 2018-02-01 ENCOUNTER — Encounter
Admission: RE | Admit: 2018-02-01 | Discharge: 2018-02-01 | Disposition: A | Discharge status: Home / Self Care | Payer: No Typology Code available for payment source | Source: Ambulatory Visit | Attending: Oncology | Admitting: Oncology

## 2018-02-01 ENCOUNTER — Telehealth: Payer: Self-pay | Admitting: *Deleted

## 2018-02-01 DIAGNOSIS — C8590 Non-Hodgkin lymphoma, unspecified, unspecified site: Secondary | ICD-10-CM | POA: Diagnosis present

## 2018-02-01 LAB — GLUCOSE, CAPILLARY: Glucose-Capillary: 77 mg/dL (ref 70–99)

## 2018-02-01 MED ORDER — FLUDEOXYGLUCOSE F - 18 (FDG) INJECTION
7.4000 | Freq: Once | INTRAVENOUS | Status: AC | PRN
  Administered 2018-02-01: 7.4 via INTRAVENOUS

## 2018-02-01 NOTE — Telephone Encounter (Signed)
Called report printed and handed to Dr Grayland Ormond  IMPRESSION: 1. Most concerning finding is interval enlargement of the spleen with new hypermetabolic activity. While these combined splenic findings can be seen in a GCSF type response, concern for lymphoma recurrence. Recommend clinical correlation for such. 2. Uniform increase in marrow activity in the lumbar spine and pelvis and lesser degree the thoracic spine. This again can be seen GCSF type response. In the absence of such support, concern for lymphoma recurrence. 3. No evidence lung cancer recurrence in lungs. 4. Increase consolidation in the RIGHT lower lobe is concerning for pneumonia. Potential resolving pneumonia as metabolic activity is minimal. 5. Stable focus of reticulation in the LEFT lower lobe with metabolic activity, favored benign.

## 2018-02-02 ENCOUNTER — Inpatient Hospital Stay: Payer: No Typology Code available for payment source

## 2018-02-02 ENCOUNTER — Encounter: Payer: Self-pay | Admitting: Oncology

## 2018-02-02 ENCOUNTER — Inpatient Hospital Stay: Payer: No Typology Code available for payment source | Attending: Oncology | Admitting: Oncology

## 2018-02-02 VITALS — BP 135/86 | HR 75 | Temp 97.5°F | Resp 18

## 2018-02-02 DIAGNOSIS — C3412 Malignant neoplasm of upper lobe, left bronchus or lung: Secondary | ICD-10-CM

## 2018-02-02 DIAGNOSIS — C8111 Nodular sclerosis classical Hodgkin lymphoma, lymph nodes of head, face, and neck: Secondary | ICD-10-CM

## 2018-02-02 DIAGNOSIS — Z87891 Personal history of nicotine dependence: Secondary | ICD-10-CM

## 2018-02-02 DIAGNOSIS — Z8571 Personal history of Hodgkin lymphoma: Secondary | ICD-10-CM | POA: Diagnosis not present

## 2018-02-02 DIAGNOSIS — C3431 Malignant neoplasm of lower lobe, right bronchus or lung: Secondary | ICD-10-CM | POA: Diagnosis not present

## 2018-02-02 DIAGNOSIS — D61818 Other pancytopenia: Secondary | ICD-10-CM

## 2018-02-02 DIAGNOSIS — Z800 Family history of malignant neoplasm of digestive organs: Secondary | ICD-10-CM

## 2018-02-02 LAB — CBC WITH DIFFERENTIAL/PLATELET
BASOS PCT: 0 %
Band Neutrophils: 1 %
Basophils Absolute: 0 10*3/uL (ref 0–0.1)
Eosinophils Absolute: 0.8 10*3/uL — ABNORMAL HIGH (ref 0–0.7)
Eosinophils Relative: 10 %
HCT: 30.7 % — ABNORMAL LOW (ref 35.0–47.0)
Hemoglobin: 10.3 g/dL — ABNORMAL LOW (ref 12.0–16.0)
LYMPHS PCT: 61 %
Lymphs Abs: 4.6 10*3/uL — ABNORMAL HIGH (ref 1.0–3.6)
MCH: 30.9 pg (ref 26.0–34.0)
MCHC: 33.6 g/dL (ref 32.0–36.0)
MCV: 91.9 fL (ref 80.0–100.0)
MONO ABS: 1.8 10*3/uL — AB (ref 0.2–0.9)
Monocytes Relative: 23 %
NEUTROS ABS: 0.5 10*3/uL — AB (ref 1.4–6.5)
Neutrophils Relative %: 5 %
PLATELETS: 65 10*3/uL — AB (ref 150–440)
RBC: 3.34 MIL/uL — ABNORMAL LOW (ref 3.80–5.20)
RDW: 20.2 % — ABNORMAL HIGH (ref 11.5–14.5)
WBC: 7.7 10*3/uL (ref 3.6–11.0)

## 2018-02-02 LAB — BASIC METABOLIC PANEL
ANION GAP: 8 (ref 5–15)
BUN: 21 mg/dL (ref 8–23)
CALCIUM: 11.6 mg/dL — AB (ref 8.9–10.3)
CO2: 25 mmol/L (ref 22–32)
CREATININE: 1.3 mg/dL — AB (ref 0.44–1.00)
Chloride: 106 mmol/L (ref 98–111)
GFR calc Af Amer: 45 mL/min — ABNORMAL LOW (ref >60)
GFR calc non Af Amer: 39 mL/min — ABNORMAL LOW (ref >60)
Glucose, Bld: 102 mg/dL — ABNORMAL HIGH (ref 70–99)
Potassium: 3.9 mmol/L (ref 3.5–5.1)
Sodium: 139 mmol/L (ref 135–145)

## 2018-02-02 MED ORDER — ZOLEDRONIC ACID 4 MG/100ML IV SOLN
3.0000 mg | Freq: Once | INTRAVENOUS | Status: AC
  Administered 2018-02-02: 3 mg via INTRAVENOUS

## 2018-02-02 MED ORDER — SODIUM CHLORIDE 0.9 % IV SOLN
Freq: Once | INTRAVENOUS | Status: AC
  Administered 2018-02-02: 11:00:00 via INTRAVENOUS
  Filled 2018-02-02: qty 250

## 2018-02-02 NOTE — Patient Instructions (Signed)

## 2018-02-02 NOTE — Progress Notes (Signed)
Patient here today for follow up and PET results.

## 2018-02-07 LAB — COMP PANEL: LEUKEMIA/LYMPHOMA

## 2018-02-15 ENCOUNTER — Other Ambulatory Visit
Admission: RE | Admit: 2018-02-15 | Discharge: 2018-02-15 | Disposition: A | Discharge status: Home / Self Care | Payer: Medicare Other | Source: Ambulatory Visit | Attending: Family Medicine | Admitting: Family Medicine

## 2018-02-15 DIAGNOSIS — G9340 Encephalopathy, unspecified: Secondary | ICD-10-CM | POA: Diagnosis present

## 2018-02-15 LAB — BASIC METABOLIC PANEL
ANION GAP: 4 — AB (ref 5–15)
BUN: 53 mg/dL — ABNORMAL HIGH (ref 8–23)
CHLORIDE: 115 mmol/L — AB (ref 98–111)
CO2: 30 mmol/L (ref 22–32)
CREATININE: 3.25 mg/dL — AB (ref 0.44–1.00)
Calcium: >15 mg/dL (ref 8.9–10.3)
GFR, EST AFRICAN AMERICAN: 15 mL/min — AB (ref >60)
GFR, EST NON AFRICAN AMERICAN: 13 mL/min — AB (ref >60)
Glucose, Bld: 56 mg/dL — ABNORMAL LOW (ref 70–99)
POTASSIUM: 4.1 mmol/L (ref 3.5–5.1)
Sodium: 149 mmol/L — ABNORMAL HIGH (ref 135–145)

## 2018-02-15 LAB — URINALYSIS, COMPLETE (UACMP) WITH MICROSCOPIC
Bacteria, UA: NONE SEEN
Bilirubin Urine: NEGATIVE
GLUCOSE, UA: NEGATIVE mg/dL
KETONES UR: NEGATIVE mg/dL
NITRITE: POSITIVE — AB
PH: 6 (ref 5.0–8.0)
Protein, ur: 100 mg/dL — AB
SPECIFIC GRAVITY, URINE: 1.012 (ref 1.005–1.030)

## 2018-02-16 LAB — URINE CULTURE: Culture: NO GROWTH

## 2018-02-22 ENCOUNTER — Encounter: Payer: Self-pay | Admitting: Urology

## 2018-02-22 ENCOUNTER — Ambulatory Visit: Payer: BC Managed Care – PPO | Admitting: Urology

## 2018-02-27 DEATH — deceased

## 2018-03-02 ENCOUNTER — Ambulatory Visit: Payer: Medicare Other | Admitting: Oncology

## 2018-03-02 ENCOUNTER — Other Ambulatory Visit: Payer: Medicare Other

## 2018-03-02 ENCOUNTER — Ambulatory Visit: Payer: Medicare Other

## 2018-03-22 ENCOUNTER — Ambulatory Visit: Payer: Medicare Other

## 2018-03-25 NOTE — Progress Notes (Deleted)
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  INTERVAL HISTORY: Patient returns to clinic today for further evaluation and discussion of her PET scan results.  She continues to have a decreased performance status as well as intermittent confusion.  She has chronic shortness of breath and cough.  She has no neurologic complaints.  She has a poor appetite with early satiety.  She denies any fevers or night sweats.  She denies any chest pain or hemoptysis. She denies any nausea, vomiting, constipation, or diarrhea.   She has no urinary complaints.  Patient offers no further specific complaints today.  REVIEW OF SYSTEMS:   Review of Systems  Constitutional: Positive for malaise/fatigue and weight loss. Negative for fever.  Respiratory: Positive for cough and shortness of breath. Negative for hemoptysis.   Cardiovascular: Negative.  Negative for chest pain and leg swelling.  Gastrointestinal: Negative.  Negative for abdominal pain and constipation.  Genitourinary: Negative.  Negative for dysuria.  Musculoskeletal: Negative.   Skin: Negative.  Negative for rash.  Neurological: Positive for weakness. Negative for dizziness, sensory change, focal weakness and headaches.  Endo/Heme/Allergies: Negative.   Psychiatric/Behavioral: Negative.  Negative for memory loss. The patient is not nervous/anxious.     As per HPI. Otherwise, a complete review of systems is negative.  PAST MEDICAL HISTORY: Past Medical History:  Diagnosis Date  . Anemia   . Chest pain, unspecified   . Chronic kidney disease   . COPD (chronic obstructive pulmonary disease) ([place])   . Epistaxis   . Erosive  gastropathy   . GERD (gastroesophageal reflux disease)   . High cholesterol   . Hodgkin's lymphoma ([place]) 2014   chemo  . Hodgkin's lymphoma ([place])   . Hypercalcemia   . Hypertension   . Lung cancer ([place]) 2015  . Pneumonia   . 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 Shortness of breath dyspnea     PAST SURGICAL HISTORY: Past Surgical History:  Procedure Laterality Date  . ABDOMINAL HYSTERECTOMY    . APPENDECTOMY    . COLONOSCOPY WITH PROPOFOL N/A 10/01/2015   Procedure: COLONOSCOPY WITH PROPOFOL;  Surgeon: Manya Silvas, MD;  Location: [facility] END[facility]COPY;  Service: Endoscopy;  Laterality: N/A;  . NASAL SINUS SURGERY    . PORTA CATH REMOVAL N/A 07/21/2016   Procedure: Glori Luis Cath Removal;  Surgeon: Algernon Huxley, MD;  Location: [facility];  Service: Cardiovascular;  Laterality: N/A;  . right lower lobe wedge resection      FAMILY HISTORY Family History  Problem Relation Age of Onset  . Diabetes Other   . CAD Other   . Cancer Other        colon cancer, breast cancer  . Kidney disease Other   . Breast cancer Maternal Aunt   . Prostate cancer Father   . Gastric cancer Mother   . Kidney cancer Neg Hx        ADVANCED DIRECTIVES:    HEALTH MAINTENANCE: Social History   Tobacco Use  . Smoking status: Former Smoker    Packs/day: 1.00    Years: 18.00    Pack years: 18.00    Types: Cigarettes    Last attempt to quit: 2013    Years since  quitting: 6.8  . Smokeless tobacco: Never Used  Substance Use Topics  . Alcohol use: Not Currently    Alcohol/week: 1.0 standard drinks    Types: 1 Glasses of wine per week    Comment: Occassionaly  . Drug use: No     Colonoscopy:  PAP:  Bone density:  Lipid panel:  Allergies  Allergen Reactions  . Hydrochlorothiazide Other (See Comments)    Elevation of creatinine Patient doesn't remember what reaction she had to it.    Current Outpatient Medications  Medication Sig Dispense Refill  . albuterol (PROVENTIL HFA;VENTOLIN  HFA) 108 (90 BASE) MCG/ACT inhaler Inhale 2 puffs into the lungs every 6 (six) hours as needed for wheezing or shortness of breath.    Marland Kitchen amLODipine (NORVASC) 10 MG tablet Take 1 tablet (10 mg total) by mouth daily.    Marland Kitchen antiseptic oral rinse (BIOTENE) LIQD 15 mLs by Mouth Rinse route as needed for dry mouth.    Marland Kitchen atorvastatin (LIPITOR) 40 MG tablet Take 1 tablet (40 mg total) by mouth daily. 30 tablet 0  . benazepril (LOTENSIN) 20 MG tablet Take 1 tablet (20 mg total) by mouth daily.    . calcium carbonate (OS-CAL - DOSED IN MG OF ELEMENTAL CALCIUM) 1250 (500 Ca) MG tablet Take 1 tablet by mouth 2 (two) times daily with a meal.     . carvedilol (COREG) 12.5 MG tablet Take 1 tablet (12.5 mg total) by mouth 2 (two) times daily with a meal.    . mirtazapine (REMERON) 15 MG tablet Take 15 mg by mouth at bedtime.     . Multiple Vitamins-Minerals (MULTIVITAMIN PO) Take 1 tablet by mouth daily.     Marland Kitchen triamcinolone cream (KENALOG) 0.1 % Apply 1 application topically 2 (two) times daily.  1  . vitamin B-12 (CYANOCOBALAMIN) 1000 MCG tablet Take 1,000 mcg by mouth daily.     No current facility-administered medications for this visit.    Facility-Administered Medications Ordered in Other Visits  Medication Dose Route Frequency Provider Last Rate Last Dose  . heparin lock flush 100 unit/mL  500 Units Intravenous Once Lloyd Huger, MD      . sodium chloride 0.9 % injection 10 mL  10 mL Intravenous PRN Lloyd Huger, MD   10 mL at 02/09/15 0844    OBJECTIVE: There were no vitals filed for this visit.   There is no height or weight on file to calculate BMI.    ECOG FS:0 - Asymptomatic  General: Thin, no acute distress.  Sitting in a wheelchair. Eyes: Pink conjunctiva, anicteric sclera. HEENT: Normocephalic, moist mucous membranes, no palpable lymphadenopathy. Lungs: Clear to auscultation bilaterally. Heart: Regular rate and rhythm. No rubs, murmurs, or gallops. Abdomen: Soft, palpable  splenomegaly. Musculoskeletal: No edema, cyanosis, or clubbing. Neuro: Alert, answering all questions appropriately. Cranial nerves grossly intact. Skin: No rashes or petechiae noted. Psych: Normal affect.  LAB RESULTS:  Lab Results  Component Value Date   NA 149 (H) 02/15/2018   K 4.1 02/15/2018   CL 115 (H) 02/15/2018   CO2 30 02/15/2018   GLUCOSE 56 (L) 02/15/2018   BUN 53 (H) 02/15/2018   CREATININE 3.25 (H) 02/15/2018   CALCIUM >15.0 (HH) 02/15/2018   PROT 5.8 (L) 01/26/2018   ALBUMIN 2.9 (L) 01/26/2018   AST 35 01/26/2018   ALT 20 01/26/2018   ALKPHOS 213 (H) 01/26/2018   BILITOT 1.0 01/26/2018   GFRNONAA 13 (L) 02/15/2018   GFRAA 15 (L) 02/15/2018  Lab Results  Component Value Date   WBC 7.7 02/02/2018   NEUTROABS 0.5 (L) 02/02/2018   HGB 10.3 (L) 02/02/2018   HCT 30.7 (L) 02/02/2018   MCV 91.9 02/02/2018   PLT 65 (L) 02/02/2018     STUDIES: No results found.  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  PLAN:    1.  Hodgkin's lymphoma: Patient was initially diagnosed in February 2013.  PET scan results from February 01, 2018 reviewed independently and report as above with interval enlargement and hypermetabolism of the spleen.  The uniform increase in bone marrow activity is also concerning.  Would be highly unusual for Hodgkin's lymphoma to recur this late and in this manner, but possible.  Patient may also have a second lymphoma involving the spleen and the bone marrow.  Given her performance status, she would not be able to tolerate treatment at this point.  Peripheral blood flow cytometry was drawn for completeness.  If patient performance status improves, she would agree to a bone marrow biopsy to determine diagnosis.   2.  Left lower lobe nodule: PET scan results as above with no evidence of recurrence. 2.  Left upper lobe lung cancer: Previously biopsied and confirmed  squamous cell carcinoma.  Patient was subsequently treated with XRT only.  PET scan as above.   3. Right lower lobe nodule: Also biopsied and confirmed to be squamous cell carcinoma.  Patient completed treatment with XRT in November 2018.  PET scan as above. 4.  Hypercalcemia: Patient's calcium levels continue to increase and is now 11.6.  Possibly secondary to underlying lymphoma.  This is also contributing to her confusion.  Proceed with Zometa today.  Repeat laboratory work at her recent facility in 2 weeks and return to clinic in 4 weeks for repeat laboratory work, further evaluation, and consideration of additional Zometa. 5.  Thrombocytopenia: Possibly secondary to bone marrow infiltration.  Monitor.   6.  Neutropenia: Patient's ANC is 500 despite a normal white blood cell count.  Again, possibly secondary to bone marrow infiltration as well as documented splenomegaly. 7.  Anemia: Decreased, but stable.  Patient does not require blood transfusion at this time    Patient expressed understanding and was in agreement with this plan. She also understands that She can call clinic at any time with any questions, concerns, or complaints.   Lloyd Huger, MD   03/25/2018 11:26 PM

## 2018-03-26 ENCOUNTER — Inpatient Hospital Stay: Payer: No Typology Code available for payment source | Admitting: Oncology

## 2018-03-26 ENCOUNTER — Inpatient Hospital Stay: Payer: No Typology Code available for payment source

## 2018-03-26 ENCOUNTER — Ambulatory Visit: Payer: No Typology Code available for payment source | Admitting: Radiation Oncology
